# Patient Record
Sex: Male | Born: 1965 | Race: White | Hispanic: No | Marital: Married | Smoking: Current every day smoker
Health system: Southern US, Community
[De-identification: ages and names within clinical notes are randomized; demographics above are authoritative.]

## PROBLEM LIST (undated history)

## (undated) DIAGNOSIS — K259 Gastric ulcer, unspecified as acute or chronic, without hemorrhage or perforation: Secondary | ICD-10-CM

## (undated) DIAGNOSIS — F419 Anxiety disorder, unspecified: Secondary | ICD-10-CM

## (undated) DIAGNOSIS — D649 Anemia, unspecified: Secondary | ICD-10-CM

## (undated) DIAGNOSIS — C801 Malignant (primary) neoplasm, unspecified: Secondary | ICD-10-CM

## (undated) DIAGNOSIS — I10 Essential (primary) hypertension: Secondary | ICD-10-CM

## (undated) DIAGNOSIS — N289 Disorder of kidney and ureter, unspecified: Secondary | ICD-10-CM

## (undated) DIAGNOSIS — E119 Type 2 diabetes mellitus without complications: Secondary | ICD-10-CM

## (undated) DIAGNOSIS — K5792 Diverticulitis of intestine, part unspecified, without perforation or abscess without bleeding: Secondary | ICD-10-CM

## (undated) DIAGNOSIS — K219 Gastro-esophageal reflux disease without esophagitis: Secondary | ICD-10-CM

## (undated) HISTORY — PX: KIDNEY TRANSPLANT: SHX239

## (undated) HISTORY — PX: DIALYSIS FISTULA CREATION: SHX611

## (undated) HISTORY — PX: NASAL RECONSTRUCTION: SHX2069

## (undated) HISTORY — PX: COLON SURGERY: SHX602

## (undated) HISTORY — PX: COLON RESECTION: SHX5231

## (undated) SURGERY — Surgical Case
Anesthesia: *Unknown

---

## 2004-10-09 ENCOUNTER — Ambulatory Visit: Payer: Self-pay | Admitting: Nephrology

## 2006-04-30 ENCOUNTER — Other Ambulatory Visit: Payer: Self-pay

## 2006-04-30 ENCOUNTER — Emergency Department: Payer: Self-pay | Admitting: Emergency Medicine

## 2006-06-04 ENCOUNTER — Ambulatory Visit: Payer: Self-pay | Admitting: Vascular Surgery

## 2006-06-07 ENCOUNTER — Ambulatory Visit: Payer: Self-pay | Admitting: Nephrology

## 2006-09-03 ENCOUNTER — Ambulatory Visit: Payer: Self-pay | Admitting: Vascular Surgery

## 2006-09-25 ENCOUNTER — Ambulatory Visit: Payer: Self-pay | Admitting: Nephrology

## 2006-11-14 ENCOUNTER — Ambulatory Visit: Payer: Self-pay | Admitting: Nephrology

## 2007-06-02 ENCOUNTER — Ambulatory Visit: Payer: Self-pay | Admitting: Nephrology

## 2007-06-03 ENCOUNTER — Ambulatory Visit: Payer: Self-pay | Admitting: Internal Medicine

## 2007-06-04 ENCOUNTER — Ambulatory Visit: Payer: Self-pay | Admitting: Nephrology

## 2007-07-01 ENCOUNTER — Ambulatory Visit: Payer: Self-pay | Admitting: Nephrology

## 2011-11-05 DIAGNOSIS — Z940 Kidney transplant status: Secondary | ICD-10-CM | POA: Insufficient documentation

## 2012-03-03 ENCOUNTER — Emergency Department: Payer: Self-pay | Admitting: Emergency Medicine

## 2012-03-03 LAB — BASIC METABOLIC PANEL
Anion Gap: 5 — ABNORMAL LOW (ref 7–16)
BUN: 16 mg/dL (ref 7–18)
Calcium, Total: 9.6 mg/dL (ref 8.5–10.1)
Co2: 27 mmol/L (ref 21–32)
EGFR (Non-African Amer.): 53 — ABNORMAL LOW
Glucose: 99 mg/dL (ref 65–99)
Osmolality: 279 (ref 275–301)
Potassium: 4.2 mmol/L (ref 3.5–5.1)

## 2012-03-03 LAB — CBC
HGB: 14.3 g/dL (ref 13.0–18.0)
MCHC: 32.4 g/dL (ref 32.0–36.0)
MCV: 90 fL (ref 80–100)
Platelet: 200 10*3/uL (ref 150–440)
RBC: 4.91 10*6/uL (ref 4.40–5.90)
RDW: 14 % (ref 11.5–14.5)

## 2012-03-03 LAB — CK TOTAL AND CKMB (NOT AT ARMC)
CK, Total: 134 U/L (ref 35–232)
CK-MB: 2.3 ng/mL (ref 0.5–3.6)

## 2012-11-12 ENCOUNTER — Ambulatory Visit: Payer: Self-pay | Admitting: Unknown Physician Specialty

## 2012-11-17 ENCOUNTER — Ambulatory Visit: Payer: Self-pay | Admitting: Unknown Physician Specialty

## 2013-04-12 ENCOUNTER — Ambulatory Visit: Payer: Self-pay | Admitting: Orthopedic Surgery

## 2014-03-21 ENCOUNTER — Ambulatory Visit: Payer: Self-pay | Admitting: Family Medicine

## 2014-03-24 ENCOUNTER — Ambulatory Visit: Payer: Self-pay | Admitting: Family Medicine

## 2014-03-24 ENCOUNTER — Ambulatory Visit: Payer: Self-pay | Admitting: Internal Medicine

## 2014-04-11 ENCOUNTER — Inpatient Hospital Stay
Admit: 2014-04-11 | Disposition: A | Discharge status: Home / Self Care | Payer: Self-pay | Attending: Internal Medicine | Admitting: Internal Medicine

## 2014-04-11 LAB — CBC WITH DIFFERENTIAL/PLATELET
BASOS PCT: 0.5 %
Basophil #: 0.1 10*3/uL (ref 0.0–0.1)
Eosinophil #: 0.2 10*3/uL (ref 0.0–0.7)
Eosinophil %: 1.6 %
HCT: 43.5 % (ref 40.0–52.0)
HGB: 14.3 g/dL (ref 13.0–18.0)
LYMPHS ABS: 1.4 10*3/uL (ref 1.0–3.6)
Lymphocyte %: 12.6 %
MCH: 28.4 pg (ref 26.0–34.0)
MCHC: 32.9 g/dL (ref 32.0–36.0)
MCV: 87 fL (ref 80–100)
MONO ABS: 1.1 x10 3/mm — AB (ref 0.2–1.0)
MONOS PCT: 9.7 %
NEUTROS ABS: 8.5 10*3/uL — AB (ref 1.4–6.5)
Neutrophil %: 75.6 %
PLATELETS: 176 10*3/uL (ref 150–440)
RBC: 5.03 10*6/uL (ref 4.40–5.90)
RDW: 14.1 % (ref 11.5–14.5)
WBC: 11.2 10*3/uL — ABNORMAL HIGH (ref 3.8–10.6)

## 2014-04-11 LAB — URINALYSIS, COMPLETE
BACTERIA: NONE SEEN
Bilirubin,UR: NEGATIVE
Glucose,UR: NEGATIVE mg/dL (ref 0–75)
Ketone: NEGATIVE
Leukocyte Esterase: NEGATIVE
NITRITE: NEGATIVE
Ph: 6 (ref 4.5–8.0)
Protein: NEGATIVE
RBC,UR: 2 /HPF (ref 0–5)
Specific Gravity: 1.014 (ref 1.003–1.030)
Squamous Epithelial: NONE SEEN
WBC UR: <1 /HPF (ref 0–5)

## 2014-04-11 LAB — COMPREHENSIVE METABOLIC PANEL
ALT: 19 U/L
Albumin: 4.2 g/dL
Alkaline Phosphatase: 54 U/L
Anion Gap: 8 (ref 7–16)
BILIRUBIN TOTAL: 0.9 mg/dL
BUN: 19 mg/dL
CO2: 26 mmol/L
CREATININE: 1.31 mg/dL — AB
Calcium, Total: 9.7 mg/dL
Chloride: 103 mmol/L
EGFR (African American): >60
EGFR (Non-African Amer.): >60
GLUCOSE: 109 mg/dL — AB
Potassium: 3.8 mmol/L
SGOT(AST): 16 U/L
Sodium: 137 mmol/L
Total Protein: 7.8 g/dL

## 2014-04-11 LAB — TROPONIN I

## 2014-04-11 LAB — LIPASE, BLOOD: LIPASE: 56 U/L — AB

## 2014-04-12 LAB — CBC WITH DIFFERENTIAL/PLATELET
Basophil #: 0 10*3/uL (ref 0.0–0.1)
Basophil %: 0.5 %
Eosinophil #: 0.1 10*3/uL (ref 0.0–0.7)
Eosinophil %: 1.1 %
HCT: 39 % — ABNORMAL LOW (ref 40.0–52.0)
HGB: 12.9 g/dL — ABNORMAL LOW (ref 13.0–18.0)
Lymphocyte #: 1 10*3/uL (ref 1.0–3.6)
Lymphocyte %: 11 %
MCH: 28.5 pg (ref 26.0–34.0)
MCHC: 33.2 g/dL (ref 32.0–36.0)
MCV: 86 fL (ref 80–100)
Monocyte #: 0.9 x10 3/mm (ref 0.2–1.0)
Monocyte %: 9.6 %
Neutrophil #: 6.9 10*3/uL — ABNORMAL HIGH (ref 1.4–6.5)
Neutrophil %: 77.8 %
Platelet: 138 10*3/uL — ABNORMAL LOW (ref 150–440)
RBC: 4.53 10*6/uL (ref 4.40–5.90)
RDW: 14.1 % (ref 11.5–14.5)
WBC: 8.9 10*3/uL (ref 3.8–10.6)

## 2014-04-12 LAB — TROPONIN I

## 2014-04-12 LAB — BASIC METABOLIC PANEL
Anion Gap: 8 (ref 7–16)
BUN: 17 mg/dL
Calcium, Total: 9.2 mg/dL
Chloride: 103 mmol/L
Co2: 24 mmol/L
Creatinine: 1.41 mg/dL — ABNORMAL HIGH
EGFR (African American): >60
EGFR (Non-African Amer.): 58 — ABNORMAL LOW
Glucose: 129 mg/dL — ABNORMAL HIGH
Potassium: 3.8 mmol/L
Sodium: 135 mmol/L

## 2014-04-12 LAB — TSH: Thyroid Stimulating Horm: 1.694 u[IU]/mL

## 2014-04-12 LAB — VANCOMYCIN, TROUGH: Vancomycin, Trough: 9 ug/mL — ABNORMAL LOW

## 2014-04-13 DIAGNOSIS — R079 Chest pain, unspecified: Secondary | ICD-10-CM | POA: Diagnosis not present

## 2014-04-14 ENCOUNTER — Encounter: Payer: Self-pay | Admitting: Cardiovascular Disease

## 2014-05-06 NOTE — Op Note (Signed)
PATIENT NAME:  Billy Hughes MR#:  [id number] DATE OF BIRTH:  Mar 21, 1965  DATE OF PROCEDURE:  11/17/2012  PREOPERATIVE DIAGNOSIS: Severe nasal obstruction.   POSTOPERATIVE DIAGNOSIS: Severe nasal obstruction.  PROCEDURES: 1.  Nasal septoplasty.  2.  Bilateral submucous resection of inferior turbinates.   SURGEON: Beverly Gust, MD  OPERATIVE FINDINGS: 100% nasal obstruction due to severe septal deviation to the right, bilateral inferior turbinate hypertrophy.   DESCRIPTION OF PROCEDURE: Billy Hughes was identified in the holding area and taken to the operating room and placed in the supine position. After general endotracheal anesthesia, the table was turned 90 degrees and the patient was draped in the usual fashion for nasal surgery. A topical anesthetic of phenylephrine lidocaine was placed through the left nostril. It would not go on a pledget through the right nostril. After 5 minutes this was removed. A local anesthetic of 1% lidocaine with 1:100,000 epinephrine was used to inject the nasal septum and the inferior turbinate on the left. I could not seed the turbinate on the right due to the severe septal deformity. A total of 9 mL was used. With this completed, due to the severe nature of the septal deformity on the right, a hemitransfixion incision was created on the left. A subperichondrial plane was elevated on the right side around the large curvature of the septum. An inferior rim of cartilage was then excised and a subperichondrial plane was elevated posteriorly on the left. This isolated the large septal spur which had an obvious fracture which made the cartilage at approximately 80 degrees completely obstructing the nostril on the right. The inferior portion of this was elevated back to the perpendicular plate of the ethmoid where a subperiosteal plane was elevated on each side. This portion of the septum was removed because of the severe deformity. There was very thickened hardened bone  there which had to be chiseled as well to allow the septum to swing back into the midline. With the severe septal deformity removed, a small portion of the anterior cartilage inferiorly was also removed to allow the cartilage to swing back towards the middle. This gave excellent reduction of the nasal deformity with what appeared to be a previously obvious nasal fracture. With the septum back to the midline, a right-sided posterior/inferior fenestration was created to allow hematoma drainage. Attention was then turned to the inferior turbinates. The same local anesthetic was then injected into the right middle turbinate which could be visualized. A total of 2 mL was used. Beginning on the left hand side, a 15 blade was used to incise down to and through the underlying conchal bone of the inferior turbinate. A superolaterally based flap was then elevated. The conchal bone and underlying mucosa were then excised using the Knight scissors. The superolaterally based flap was placed back over the conchal stump and cauterized using the suction cautery. In a similar fashion, submucous resection was performed on the inferior turbinate on the right. With this completed, reinspection of the nose showed significant improvement in the severe 100% septal deformity to the right with the septum back in the midline. The hemitransfixion incision was then closed using 3-0 chromic. Standard septal splints were then placed within the nostrils against each side of the septum and affixed to the septum using a 3-0 nylon. Stammberger gel was then placed beneath each inferior turbinate. The patient was then returned to anesthesia where he was awakened in the operating room and taken to the recovery room in stable condition.  CULTURES: None.   SPECIMENS: None.   ESTIMATED BLOOD LOSS: Less than 20 mL. ____________________________ Roena Malady, MD ctm:sb D: 11/17/2012 11:00:50 ET T: 11/17/2012 11:08:39  ET JOB#: [id number]  cc: Roena Malady, MD, <Dictator> Roena Malady MD ELECTRONICALLY SIGNED 11/30/2012 7:58

## 2014-05-15 NOTE — Consult Note (Signed)
I agree with plans for discharge, recommend cipro and flagyl, he still has some mild tenderness in LLQ.  Stress test pending myoview studies.  I will sign off.  Electronic Signatures: Manya Silvas (MD)  (Signed on 30-Mar-16 16:09)  Authored  Last Updated: 30-Mar-16 16:09 by Manya Silvas (MD)

## 2014-05-15 NOTE — Consult Note (Signed)
PATIENT NAME:  Billy Hughes, Billy Hughes MR#:  [id number] DATE OF BIRTH:  [birth year]  DATE OF CONSULTATION:  04/11/2014  REFERRING PHYSICIAN:   CONSULTING PHYSICIAN:  Manya Silvas, MD  HISTORY OF PRESENT ILLNESS: The patient is a 49 year old white male who developed pain 3 weeks ago and was treated for probable diverticulitis by his primary doctor with Cipro and Flagyl. It got better for a week then the illness came back and he was admitted to the hospital for diverticulitis. I was asked to see him in consultation.   PAST MEDICAL HISTORY: He has had chronic kidney disease and is status post kidney transplant. He is on immunosuppressants for this. There is also a history of hypertension, hyperlipidemia, GERD, depression and anxiety and tobacco use.   PAST SURGICAL HISTORY: Kidney transplant, left knee arthroscopy, tympanoplasty tube placement, tonsillectomy, adenoidectomy, rhinoplasty, and calcified lipomas from the chest wall.   HABITS: A 35 pack-year history of smoking. Denies alcohol or illicit drugs. Lives with his wife.   FAMILY HISTORY: Mother with breast cancer. Father died of lung cancer.   ALLERGIES: CODEINE.   MEDICATIONS: Amlodipine 10 mg at bedtime, aspirin 81 mg a day, Lipitor 10 mg a day, magnesium gluconate 500 mg 1 tablet daily, metoprolol succinate extended release 25 mg 1-1/2 tablets p.o. b.i.d., Myfortic 180 mg delayed release 3 tablets p.o. b.i.d., pantoprazole 40 mg delayed release 1 at bedtime, Paxil 20 mg daily and Prograf 0.4 mg 1 capsule every morning.  PHYSICAL EXAMINATION: GENERAL: White male lying in bed, appropriate.  HEENT: Sclerae anicteric. Conjunctivae negative. Tongue negative. The head is atraumatic.  CHEST: Clear.  HEART: No murmurs or gallops that I can hear.  ABDOMEN: Mild tenderness in the left lower quadrant. Bowel sounds are present. No hepatosplenomegaly. EYES: No evidence of jaundice.   DIAGNOSTIC DATA: Glucose 109, BUN 19, creatinine 1.3, sodium  137, potassium 3.8, chloride 103, bicarb 26, calcium 9.7. Lipase 56. Serum albumin 4.2, alk phos 54, AST 16, ALT 19. Troponin negative. White count 11.2, hemoglobin 14.3, platelet count 176,000.  CAT scan of the abdomen shows acute diverticulitis in the distal descending colon with focal soft tissue inflammation, small amount of free fluid and severe bilateral renal atrophy.   ASSESSMENT AND RECOMMENDATIONS: Diverticulitis in a patient who is on immunosuppressive medication for previous kidney transplant. The patient was supposed to have a colonoscopy in about a month from now. I encouraged him to postpone that probably at least 3 months. He is on the right therapy for diverticulitis and I expect him to continue to get better. He has already a relationship with Dr. Allen Norris so he should follow up with him after discharge.  ____________________________ Manya Silvas, MD rte:sb D: 04/11/2014 16:41:06 ET T: 04/11/2014 17:06:58 ET JOB#: [id number]  cc: Manya Silvas, MD, <Dictator> Guadalupe Maple, MD Manya Silvas MD ELECTRONICALLY SIGNED 04/30/2014 16:05

## 2014-05-15 NOTE — Consult Note (Signed)
Pt abd pain feeling much better, he had a spell of chest pain with a burning sensation lasting 10-15 min.  He is over weight.  "Recommend cardiology consult. I have ordered.  creat 1.41, K 3.8, WBC 8.9, hgb 12.9. Will check CXR also at family request.  Discussed with Dr. Tor Netters.  Electronic Signatures: Manya Silvas (MD)  (Signed on 29-Mar-16 12:33)  Authored  Last Updated: 29-Mar-16 12:33 by Manya Silvas (MD)

## 2014-05-15 NOTE — Discharge Summary (Signed)
PATIENT NAME:  Billy Hughes, Billy Hughes MR#:  [id number] DATE OF BIRTH:  1965/03/10  DATE OF ADMISSION:  04/11/2014 DATE OF DISCHARGE:  04/13/2014  For a detailed note please check the history and physical done on admission by Dr. Rosilyn Mings.   DIAGNOSES AT DISCHARGE:  1. Acute diverticulitis.  2. Chest pain, likely noncardiac.  3. Chronic obstructive pulmonary disease.  4. Hypertension.  5. Hyperlipidemia.  6. History of renal transplant.   DIET: The patient is being discharged in a low-sodium, low-fat diet.   ACTIVITY: As tolerated.   FOLLOWUP:  With Dr. Kellie Shropshire True in the next 1-2 weeks.   DISCHARGE MEDICATIONS: Myfortic 180 mg daily, amlodipine 10 mg daily, aspirin 81 mg daily, Lipitor 10 mg daily, metoprolol succinate 25 mg 1-1/2 tabs b.i.d., Protonix 40 mg daily, Paxil 20 mg daily, magnesium gluconate 500 mg daily, Prograf 0.5 mg 2 caps at bedtime and 3 caps in the morning, ciprofloxacin 500 mg b.i.d. x 10 days, and Flagyl 500 mg q. 8 hours x 10 days, Tylenol with hydrocodone 1 tab q. 6 hours as needed.   CONSULTANTS DURING THE HOSPITAL COURSE: Dr. Vira Agar from gastroenterology.   PERTINENT STUDIES DONE DURING THE HOSPITAL COURSE: A CT scan of the abdomen and pelvis done without contrast on admission showing acute diverticulitis of the distal descending colon with focal soft tissue inflammation and small amount of free fluid, no evidence of perforation or abscess formation, bibasilar airspace opacities or possible pneumonia, scattered diverticulosis, severe bilateral native renal atrophy noted.   A nuclear medicine myocardial scan done on March 30 showing no significant wall motion abnormalities, ejection fraction of 69%, no EKG changes concerning for ischemia.   HOSPITAL COURSE: This is a 49 year old male with medical problems as mentioned above who presented to the hospital with abdominal pain and noted to have acute diverticulitis.   1.  Acute diverticulitis. This was likely the  cause of the patient's abdominal pain on admission. The patient was also noted to have CT evidence of diverticulitis. The patient was admitted to the hospital, started on IV ciprofloxacin and Flagyl, has been maintained on that. His abdominal pain after being on IV antibiotics for a few days has improved. His diet was slowly from clear liquid eventually to a regular diet which he is currently tolerating. The patient was seen by gastroenterology who agreed with this management.  Since the patient is clinically improved the patient is being discharged on oral ciprofloxacin and Flagyl as stated.  2.  Chest pain. The patient had some atypical chest pain in the hospital after ambulating. He had serial troponins checked which were negative. His EKG showed no acute ST changes. He does have risk factors given his tobacco abuse and his obesity, therefore he underwent a myocardial nuclear medicine scan which showed no evidence of any acute myocardial ischemia. He is currently chest pain-free and hemodynamically stable.  3.  History of renal transplant. The patient was maintained on his Prograf and Myfortic.  He will continue that.  4.  Hypertension. The patient remained hemodynamically stable. He will continue his Norvasc and Toprol.  5.  GERD.  The patient was maintained on his Protonix. He will resume that.  6.  Anxiety. The patient was maintained on his Paxil and he will resume that.  7.  Hypoxia. The patient initially when admitted to the hospital was slightly hypoxic, thought to have possible pneumonia based on his CT abdomen findings, although that was ruled out, as the patient likely has underlying  COPD that is why he was hypoxic. He had a repeat chest x-ray in the hospital which showed no pneumonia. He was taken off IV vancomycin. He was assessed for home oxygen but did not desaturate below 88% and remained clinically stable.  8.  Nicotine dependence. The patient was maintained on a nicotine patch and Nicotrol  inhaler, and he was strongly advised to quit smoking.   DISPOSITION:  The patient is being discharged home.   TIME SPENT ON DISCHARGE: 40 minutes    ____________________________ Belia Heman. Verdell Carmine, MD vjs:bu D: 04/13/2014 16:57:50 ET T: 04/13/2014 19:43:28 ET JOB#: [id number]  cc: Belia Heman. Verdell Carmine, MD, <Dictator> Clarise Cruz, MD Henreitta Leber MD ELECTRONICALLY SIGNED 04/21/2014 16:21

## 2014-05-15 NOTE — H&P (Signed)
PATIENT NAME:  Billy Hughes, Billy Hughes MR#:  [id number] DATE OF BIRTH:  17-Nov-1965  DATE OF ADMISSION:  04/11/2014  REFERRING PHYSICIAN: Charlesetta Ivory, MD  PRIMARY CARE PHYSICIAN: Golden Pop, MD  ADMISSION DIAGNOSIS: Pneumonia, diverticulitis and intractable pain.   HISTORY OF PRESENT ILLNESS: This is a 49 year old Caucasian male who presents to the Emergency Department complaining of left lower quadrant pain. The patient states that he initially had the pain approximately 3 weeks ago when he went to his primary care doctor who prescribed antibiotics for diverticulitis. At that time, he was found to be Hemoccult positive. He was set up with an appointment with Dr. Allen Norris from gastroenterology for a colonoscopy, on the 11th of April. He has been doing well since finishing antibiotics 1 week ago yesterday, but this morning he awoke with left lower quadrant pain yet again. He denies nausea, vomiting, diarrhea, or fever. He also denies any chest pain or shortness of breath. In the Emergency Department, the patient was found to have some desaturations in his peripheral oxygen content and then found to have early pneumonia on imaging, which prompted the Emergency Department to call for admission.   REVIEW OF SYSTEMS: CONSTITUTIONAL: The patient denies fever or weakness.  EYES: Denies blurred vision or inflammation.  EARS, NOSE AND THROAT: Denies tinnitus or sore throat.  RESPIRATORY: Denies cough or shortness of breath.  CARDIOVASCULAR: Denies chest pain, palpitations, orthopnea or paroxysmal nocturnal dyspnea.  GASTROINTESTINAL: Denies nausea, vomiting, diarrhea, or abdominal pain.  ENDOCRINE: Denies polyuria or polydipsia.  HEMATOLOGIC AND LYMPHATIC: Denies easy bruising or bleeding.  GENITOURINARY: Denies dysuria, increased frequency, or hesitancy of urination.  MUSCULOSKELETAL: Denies arthralgias or myalgias.  NEUROLOGIC: Denies numbness in his extremities or dysarthria.  PSYCHIATRIC: Denies  depression or suicidal ideation.   PAST MEDICAL HISTORY: Chronic kidney disease status post kidney transplant, tobacco abuse, hypertension, hyperlipidemia, gastroesophageal reflux disease, and depression/anxiety.   PAST SURGICAL HISTORY: The patient has had a kidney transplant, left knee arthroscopy, tympanoplasty as well as tympanostomy tube placement, tonsillectomy and adenoidectomy, rhinoplasty as well as removal of calcified lipomas from his chest wall.   SOCIAL HISTORY: The patient lives with his wife. He is a 35 pack-year smoker. He denies drinking or any illicit drug use.   FAMILY HISTORY: Significant for hypertension throughout many members of the family. His father is deceased of lung cancer and his mother had breast cancer.   MEDICATIONS: 1.  Amlodipine 10 mg 1 tablet p.o. at bedtime.  2.  Aspirin 81 mg 1 tab p.o. at bedtime.  3.  Lipitor 10 mg 1 tablet p.o. every morning.  4.  Magnesium gluconate 500 mg 1 tablet p.o. daily.  5.  Metoprolol succinate extended release 25 mg 1-1/2 tablets p.o. b.i.d.  6.  Myfortic 180 mg delayed release 3 tablets p.o. b.i.d.  7.  Pantoprazole 40 mg delayed release 1 tablet p.o. at bedtime.  8.  Paxil 20 mg 1 tablet p.o. daily.  9.  Prograf 0.4 mg 1 capsule p.o. every morning.   ALLERGIES: CODEINE.  PERTINENT LABORATORY RESULTS AND RADIOGRAPHIC FINDINGS: Serum glucose 109, BUN 19, creatinine 1.31, serum sodium 137, potassium 3.8, chloride 103, bicarb 26, calcium 9.7. Lipase 56. Serum albumin 4.2, alk phos 54, AST 16, ALT 19. Troponin is negative. White blood cell count 11.2, hemoglobin 14.3, hematocrit 43.5, platelet count 176,000, MCV  87.   Urinalysis is negative for infection.   CT of the abdomen and pelvis without contrast shows acute diverticulitis in the distal descending colon with  focal soft tissue inflammation and a small amount of free fluid. There is no evidence of perforation or abscess. There are also bibasilar air space opacities that  may reflect atelectasis or possibly mild pneumonia, particularly in the right base. There is some scattered diverticulosis. There is severe bilateral native renal atrophy. 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   PHYSICAL EXAMINATION: VITAL SIGNS: Temperature 98.6, pulse 72, respirations 18, blood pressure 142/78, pulse oximetry 91% on 2 liters of oxygen via nasal cannula.  GENERAL: The patient is alert and oriented x3, in no apparent distress.  HEENT: Normocephalic, atraumatic. Pupils equal, round, and reactive to light and accommodation. Extraocular movements are intact. Mucous membranes are moist.  NECK: Trachea is midline. No adenopathy. Thyroid is nonpalpable and nontender.  CHEST: Symmetric and atraumatic.  CARDIOVASCULAR: Regular rate and rhythm. Normal S1, S2. No rubs, clicks, or murmurs appreciated.  LUNGS: Clear to auscultation bilaterally. Normal effort and excursion.  ABDOMEN: Positive bowel sounds. Soft, nontender, nondistended. No hepatosplenomegaly.  GENITOURINARY: Deferred.  MUSCULOSKELETAL: The patient moves all 4 extremities equally. There is 5 out of 5 strength in upper and lower extremities bilaterally. The patient has a normal gait.  SKIN: Warm and dry. No rashes or lesions.  EXTREMITIES: No clubbing, cyanosis, or edema.  NEUROLOGIC: Cranial nerves II through XII are grossly intact.  PSYCHIATRIC: Mood is normal. Affect is congruent. The patient has excellent judgment and insight into his medical condition.   ASSESSMENT AND PLAN: This is a 49 year old male admitted for pneumonia and diverticulitis and  pain.  1.  Pneumonia. The patient is immunocompromised due to his kidney transplant and immunosuppressive drugs. Thus, we will treat him for healthcare associated  pneumonia. He has already received ciprofloxacin and metronidazole for his diverticulitis. We will add vancomycin to cover healthcare associated pneumonia.  2.  Diverticulitis. The patient does not have a surgical abdomen at this time. There is no evidence of perforation. We will continue ciprofloxacin and Flagyl.  3.  Pain. The patient has colicky pain of the left lower quadrant. He responds well to Dilaudid and maintains his alertness. We will continue to use this medication cautiously.  4.  Kidney transplant. The patient has no evidence of rejection at this time. We will continue tacrolimus and Myfortic.  5.  Hypertension. Continue amlodipine.  6.  Hyperlipidemia. Continue statin therapy.  7.  Gastroesophageal reflux disease. Continue pantoprazole.  8.  Tobacco abuse. I have prescribed a nicotine patch for the patient.  9.  Depression and anxiety. Continue Paxil.  CODE STATUS: FULL.  TIME SPENT ON ADMISSION ORDERS AND PATIENT CARE: Approximately 35 minutes.  ____________________________ Norva Riffle. Marcille Blanco, MD msd:sb D: 04/11/2014 07:24:38 ET T: 04/11/2014 07:38:24 ET JOB#: [id number]  cc: Norva Riffle. Marcille Blanco, MD, <Dictator> Norva Riffle Jaidin Richison MD ELECTRONICALLY SIGNED 04/12/2014 0:03

## 2014-10-29 ENCOUNTER — Ambulatory Visit
Admission: EM | Admit: 2014-10-29 | Discharge: 2014-10-29 | Disposition: A | Discharge status: Home / Self Care | Payer: BLUE CROSS/BLUE SHIELD | Attending: Family Medicine | Admitting: Family Medicine

## 2014-10-29 DIAGNOSIS — H6012 Cellulitis of left external ear: Secondary | ICD-10-CM | POA: Diagnosis not present

## 2014-10-29 HISTORY — DX: Essential (primary) hypertension: I10

## 2014-10-29 MED ORDER — SULFAMETHOXAZOLE-TRIMETHOPRIM 800-160 MG PO TABS: 1.0000 | ORAL_TABLET | Freq: Two times a day (BID) | ORAL | Status: DC

## 2014-10-29 MED ORDER — OXYCODONE-ACETAMINOPHEN 5-325 MG PO TABS: 1.0000 | ORAL_TABLET | ORAL | Status: DC | PRN

## 2014-10-29 MED ORDER — CEFAZOLIN SODIUM 1 G IJ SOLR
1.0000 g | Freq: Once | INTRAMUSCULAR | Status: AC
  Administered 2014-10-29: 1 g via INTRAMUSCULAR

## 2014-10-29 NOTE — Discharge Instructions (Signed)

## 2014-10-29 NOTE — ED Provider Notes (Signed)
CSN: [id number]     Arrival date & time 10/29/14  1142 History   First MD Initiated Contact with Patient 10/29/14 1241     Chief Complaint  Patient presents with  . Otalgia    Left ear reddened, swelling and pain. Has had in the past 5 years ago. Pain 6/10.    (Consider location/radiation/quality/duration/timing/severity/associated sxs/prior Treatment) HPI Comments: 49 yo male with a 3 days h/o progressively worsening left ear redness, swelling, and pain. Denies fevers, chills, drainage.  The history is provided by the patient.    Past Medical History  Diagnosis Date  . Hypertension    Past Surgical History  Procedure Laterality Date  . Kidney transplant     History reviewed. No pertinent family history. Social History  Substance Use Topics  . Smoking status: Current Every Day Smoker -- 1.00 packs/day    Types: Cigarettes  . Smokeless tobacco: None  . Alcohol Use: No    Review of Systems  Allergies  Review of patient's allergies indicates no known allergies.  Home Medications   Prior to Admission medications   Medication Sig Start Date End Date Taking? Authorizing Provider  amLODipine (NORVASC) 10 MG tablet Take 10 mg by mouth 2 (two) times daily.   Yes Historical Provider, MD  aspirin 81 MG tablet Take 81 mg by mouth daily.   Yes Historical Provider, MD  atorvastatin (LIPITOR) 20 MG tablet Take 20 mg by mouth daily.   Yes Historical Provider, MD  magnesium gluconate (MAGONATE) 500 MG tablet Take 500 mg by mouth 2 (two) times daily.   Yes Historical Provider, MD  metoprolol succinate (TOPROL-XL) 100 MG 24 hr tablet Take 37.5 mg by mouth daily. Take with or immediately following a meal.   Yes Historical Provider, MD  mycophenolate (MYFORTIC) 180 MG EC tablet Take 180 mg by mouth 2 (two) times daily.   Yes Historical Provider, MD  pantoprazole (PROTONIX) 40 MG tablet Take 40 mg by mouth daily.   Yes Historical Provider, MD  PARoxetine (PAXIL) 20 MG tablet Take 20 mg by  mouth daily.   Yes Historical Provider, MD  tacrolimus (PROGRAF) 0.5 MG capsule Take 0.5 mg by mouth 2 (two) times daily.   Yes Historical Provider, MD  oxyCODONE-acetaminophen (PERCOCET/ROXICET) 5-325 MG tablet Take 1-2 tablets by mouth every 4 (four) hours as needed for severe pain. 10/29/14   Lorin Picket, PA-C  sulfamethoxazole-trimethoprim (BACTRIM DS,SEPTRA DS) 800-160 MG tablet Take 1 tablet by mouth 2 (two) times daily. 10/29/14   Norval Gable, MD   Meds Ordered and Administered this Visit   Medications  ceFAZolin (ANCEF) injection 1 g (1 g Intramuscular Given 10/29/14 1306)    BP 150/60 mmHg  Pulse 71  Temp(Src) 98 F (36.7 C) (Oral)  Resp 20  Ht 6' (1.829 m)  Wt 256 lb (116.121 kg)  BMI 34.71 kg/m2  SpO2 95% No data found.   Physical Exam  Constitutional: He appears well-developed and well-nourished. No distress.  Skin: He is not diaphoretic. There is erythema.  Left lower earlobe with edema, blanchable erythema, warmth, and tenderness to palpation; no fluctuance or drainage  Nursing note and vitals reviewed.   ED Course  Procedures (including critical care time)  Labs Review Labs Reviewed - No data to display  Imaging Review No results found.   Visual Acuity Review  Right Eye Distance:   Left Eye Distance:   Bilateral Distance:    Right Eye Near:   Left Eye Near:  Bilateral Near:         MDM   1. Cellulitis of earlobe, left    Discharge Medication List as of 10/29/2014  1:00 PM    START taking these medications   Details  sulfamethoxazole-trimethoprim (BACTRIM DS,SEPTRA DS) 800-160 MG tablet Take 1 tablet by mouth 2 (two) times daily., Starting 10/29/2014, Until Discontinued, Normal      1. diagnosis reviewed with patient/parent/guardian/family 2. rx as per orders above; reviewed possible side effects, interactions, risks and benefits  3. Recommend supportive treatment with warm compresses to affected area and otc analgesics  4.  Follow prn if symptoms worsen or don't improve    Norval Gable, MD 11/02/14 2139

## 2014-11-21 ENCOUNTER — Ambulatory Visit (INDEPENDENT_AMBULATORY_CARE_PROVIDER_SITE_OTHER): Payer: BLUE CROSS/BLUE SHIELD

## 2014-11-21 ENCOUNTER — Ambulatory Visit
Admission: EM | Admit: 2014-11-21 | Discharge: 2014-11-21 | Disposition: A | Discharge status: Home / Self Care | Payer: BLUE CROSS/BLUE SHIELD | Attending: Emergency Medicine | Admitting: Emergency Medicine

## 2014-11-21 DIAGNOSIS — M6248 Contracture of muscle, other site: Secondary | ICD-10-CM

## 2014-11-21 DIAGNOSIS — S46811A Strain of other muscles, fascia and tendons at shoulder and upper arm level, right arm, initial encounter: Secondary | ICD-10-CM | POA: Diagnosis not present

## 2014-11-21 DIAGNOSIS — M62838 Other muscle spasm: Secondary | ICD-10-CM

## 2014-11-21 MED ORDER — CYCLOBENZAPRINE HCL 10 MG PO TABS: 10.0000 mg | ORAL_TABLET | Freq: Three times a day (TID) | ORAL | Status: DC | PRN

## 2014-11-21 MED ORDER — HYDROCODONE-ACETAMINOPHEN 5-325 MG PO TABS: 2.0000 | ORAL_TABLET | ORAL | Status: DC | PRN

## 2014-11-21 NOTE — ED Notes (Signed)
Lifting a sleeper sofa last Tuesday and felt a pull right shoulder. Unable to lift arm above head

## 2014-11-21 NOTE — Discharge Instructions (Signed)
Do lots of Stretching. Some people find professional deep tissue massage helpful. Take Tylenol as needed. Take Norco only for severe pain. Follow-up with physical therapy. Follow-up with primary care physician as needed. Return here if not getting better, to the ER for weakness, numbness, inability to lift arm.

## 2014-11-21 NOTE — ED Provider Notes (Signed)
HPI  SUBJECTIVE:  Billy Hughes is a 49 y.o. male who presents with right neck and shoulder pain starting 6 days ago. States that he was lifting  a sleeper sofa, felt something "give way". He now reports constant, dull, neck and midclavicular pain. He denies hearing a pop or crack, swelling, erythema, nausea, vomiting, fevers, numbness. He does report occasional tingling. He denies any weakness in his arm or shoulder. Symptoms are worse with raising his arm, lifting objects and worse in the morning after he has been sleeping on his right side, reports pain with turning his head rotation, neck extension. No pain with neck flexion. no alleviating factors. He has tried ice, heat, icy hot, oxycodone which is left over from a recent colon resection. Past medical history of kidney transplant on Prograf and Myfortic. States that his kidneys are working well. Status post colon resection for diverticulitis, hypertension. No history of neck injury, shoulder injury, diabetes.    Past Medical History  Diagnosis Date  . Hypertension     Past Surgical History  Procedure Laterality Date  . Kidney transplant      Family History  Problem Relation Age of Onset  . Cancer Mother   . Cancer Father     Social History  Substance Use Topics  . Smoking status: Current Every Day Smoker -- 1.00 packs/day    Types: Cigarettes  . Smokeless tobacco: None  . Alcohol Use: No    No current facility-administered medications for this encounter.  Current outpatient prescriptions:  .  amLODipine (NORVASC) 10 MG tablet, Take 10 mg by mouth 2 (two) times daily., Disp: , Rfl:  .  aspirin 81 MG tablet, Take 81 mg by mouth daily., Disp: , Rfl:  .  atorvastatin (LIPITOR) 20 MG tablet, Take 20 mg by mouth daily., Disp: , Rfl:  .  magnesium gluconate (MAGONATE) 500 MG tablet, Take 500 mg by mouth 2 (two) times daily., Disp: , Rfl:  .  metoprolol succinate (TOPROL-XL) 100 MG 24 hr tablet, Take 37.5 mg by mouth  daily. Take with or immediately following a meal., Disp: , Rfl:  .  mycophenolate (MYFORTIC) 180 MG EC tablet, Take 180 mg by mouth 2 (two) times daily., Disp: , Rfl:  .  pantoprazole (PROTONIX) 40 MG tablet, Take 40 mg by mouth daily., Disp: , Rfl:  .  PARoxetine (PAXIL) 20 MG tablet, Take 20 mg by mouth daily., Disp: , Rfl:  .  tacrolimus (PROGRAF) 0.5 MG capsule, Take 0.5 mg by mouth 2 (two) times daily., Disp: , Rfl:  .  cyclobenzaprine (FLEXERIL) 10 MG tablet, Take 1 tablet (10 mg total) by mouth 3 (three) times daily as needed for muscle spasms., Disp: 20 tablet, Rfl: 0 .  HYDROcodone-acetaminophen (NORCO/VICODIN) 5-325 MG tablet, Take 2 tablets by mouth every 4 (four) hours as needed for moderate pain., Disp: 20 tablet, Rfl: 0  No Known Allergies   ROS  As noted in HPI.   Physical Exam  BP 145/78 mmHg  Pulse 70  Temp(Src) 96.9 F (36.1 C) (Tympanic)  Resp 16  Ht 6' (1.829 m)  Wt 250 lb (113.399 kg)  BMI 33.90 kg/m2  SpO2 99%  Constitutional: Well developed, well nourished, no acute distress Eyes:  EOMI, conjunctiva normal bilaterally HENT: Normocephalic, atraumatic,mucus membranes moist Neck: No C-spine tenderness. Positive right trapezial muscle tenderness, spasm. Patient able to perform a FROM neck. No torticollis. Respiratory: Normal inspiratory effort Cardiovascular: Normal rate GI: nondistended skin: No rash, skin intact Musculoskeletal: Tenderness  mid third clavicle, no bruising, deformity. R shoulder with ROM  somewhat limited , Drop test normal , pain with abduction past 90, Tenderness R trapezius, A/C joint NT , scapula NT , proximal humerus NT, shoulder joint NT, Motor strength normal  Sensation intact LT over deltoid region, distal NVI with hand on affected side having intact sensation and strength in the distribution of the median, radial, and ulnar nerve function.   negative tenderness in bicipital groove,  negative empty can test negative liftoff  test  Neurologic: Alert & oriented x 3, no focal neuro deficits Psychiatric: Speech and behavior appropriate   ED Course   Medications - No data to display  Orders Placed This Encounter  Procedures  . DG Clavicle Right    Standing Status: Standing     Number of Occurrences: 1     Standing Expiration Date:     Order Specific Question:  Reason for Exam (SYMPTOM  OR DIAGNOSIS REQUIRED)    Answer:  tenderness mid 1/3 clavicle r/o fx lytic bone lesion    No results found for this or any previous visit (from the past 24 hour(s)). Dg Clavicle Right  11/21/2014  CLINICAL DATA:  Tenderness over mid clavicle. EXAM: RIGHT CLAVICLE - 2+ VIEWS COMPARISON:  None. FINDINGS: There is no evidence of fracture or other focal bone lesions. Soft tissues are unremarkable. IMPRESSION: Negative. Electronically Signed   By: Rolm Baptise M.D.   On: 11/21/2014 15:20    ED Clinical Impression  Trapezius strain, right, initial encounter  Trapezius muscle spasm   ED Assessment/Plan  Reviewed imaging. No fracture, dislocation, with bone lesion See radiology report for full details.  Presentation most consistent with trapezial muscle strain/sprain with spasm. Home with Stretching,  deep tissue massage, percocet. PT referral. Follow-up with primary care physician as needed. Return here if not getting better, to the ER for weakness, numbness, inability to lift arm.  Discussed  imaging, MDM, plan and followup with patient  Discussed sn/sx that should prompt return to the UC or ED. Patient agrees with plan. *This clinic note was created using Dragon dictation software. Therefore, there may be occasional mistakes despite careful proofreading.  ?   Melynda Ripple, MD 11/21/14 1558

## 2015-02-28 ENCOUNTER — Ambulatory Visit
Admission: EM | Admit: 2015-02-28 | Discharge: 2015-02-28 | Disposition: A | Discharge status: Home / Self Care | Payer: BLUE CROSS/BLUE SHIELD | Attending: Family Medicine | Admitting: Family Medicine

## 2015-02-28 DIAGNOSIS — J0100 Acute maxillary sinusitis, unspecified: Secondary | ICD-10-CM | POA: Diagnosis not present

## 2015-02-28 MED ORDER — AMOXICILLIN-POT CLAVULANATE 875-125 MG PO TABS: 1.0000 | ORAL_TABLET | Freq: Two times a day (BID) | ORAL | Status: DC

## 2015-02-28 MED ORDER — FLUTICASONE PROPIONATE 50 MCG/ACT NA SUSP: 2.0000 | Freq: Every day | NASAL | Status: DC

## 2015-02-28 NOTE — ED Notes (Signed)
States has had sinus congestion x 1 month and sinus headache x 2 weeks. Taking Vicodin (that has from surgery) and occasionally "will help". Blowing green mucous from nose. + productive clear cough

## 2015-02-28 NOTE — ED Provider Notes (Signed)
CSN: [id number]     Arrival date & time 02/28/15  1808 History   First MD Initiated Contact with Patient 02/28/15 1841     Chief Complaint  Patient presents with  . Facial Pain   (Consider location/radiation/quality/duration/timing/severity/associated sxs/prior Treatment) HPI   50 year old male who is well-known to this practice complaining of sinus congestion for one month with a sinus headache for 1-2 weeks. He states that he's tried numerous over-the-counter preparations which has not been beneficial. He has a headache over the frontal area under his eyes. He states that he has had a large amount of mucus production that has been green and copious. Not been afebrile but has had a great deal of fatigue. He has been coughing but most likely from a postnasal drip it is nonproductive.    Past Medical History  Diagnosis Date  . Hypertension    Past Surgical History  Procedure Laterality Date  . Kidney transplant     Family History  Problem Relation Age of Onset  . Cancer Mother   . Cancer Father    Social History  Substance Use Topics  . Smoking status: Current Every Day Smoker -- 1.00 packs/day    Types: Cigarettes  . Smokeless tobacco: None  . Alcohol Use: No    Review of Systems  Constitutional: Positive for activity change. Negative for fever, chills and fatigue.  HENT: Positive for congestion, postnasal drip, rhinorrhea, sinus pressure, sneezing and sore throat.   Respiratory: Positive for cough. Negative for shortness of breath, wheezing and stridor.   All other systems reviewed and are negative.   Allergies  Review of patient's allergies indicates no known allergies.  Home Medications   Prior to Admission medications   Medication Sig Start Date End Date Taking? Authorizing Provider  amLODipine (NORVASC) 10 MG tablet Take 10 mg by mouth 2 (two) times daily.   Yes Historical Provider, MD  aspirin 81 MG tablet Take 81 mg by mouth daily.   Yes Historical Provider,  MD  atorvastatin (LIPITOR) 20 MG tablet Take 20 mg by mouth daily.   Yes Historical Provider, MD  cyclobenzaprine (FLEXERIL) 10 MG tablet Take 1 tablet (10 mg total) by mouth 3 (three) times daily as needed for muscle spasms. 11/21/14  Yes Melynda Ripple, MD  HYDROcodone-acetaminophen (NORCO/VICODIN) 5-325 MG tablet Take 2 tablets by mouth every 4 (four) hours as needed for moderate pain. 11/21/14  Yes Melynda Ripple, MD  magnesium gluconate (MAGONATE) 500 MG tablet Take 500 mg by mouth 2 (two) times daily.   Yes Historical Provider, MD  metoprolol succinate (TOPROL-XL) 100 MG 24 hr tablet Take 37.5 mg by mouth daily. Take with or immediately following a meal.   Yes Historical Provider, MD  mycophenolate (MYFORTIC) 180 MG EC tablet Take 180 mg by mouth 2 (two) times daily.   Yes Historical Provider, MD  pantoprazole (PROTONIX) 40 MG tablet Take 40 mg by mouth daily.   Yes Historical Provider, MD  PARoxetine (PAXIL) 20 MG tablet Take 20 mg by mouth daily.   Yes Historical Provider, MD  tacrolimus (PROGRAF) 0.5 MG capsule Take 0.5 mg by mouth 2 (two) times daily.   Yes Historical Provider, MD  amoxicillin-clavulanate (AUGMENTIN) 875-125 MG tablet Take 1 tablet by mouth every 12 (twelve) hours. 02/28/15   Lorin Picket, PA-C  fluticasone (FLONASE) 50 MCG/ACT nasal spray Place 2 sprays into both nostrils daily. 02/28/15   Lorin Picket, PA-C   Meds Ordered and Administered this Visit  Medications -  No data to display  BP 163/72 mmHg  Pulse 70  Temp(Src) 97.9 F (36.6 C) (Tympanic)  Resp 18  Ht 6' (1.829 m)  Wt 260 lb (117.935 kg)  BMI 35.25 kg/m2  SpO2 97% No data found.   Physical Exam  Constitutional: He is oriented to person, place, and time. He appears well-developed and well-nourished. No distress.  HENT:  Head: Normocephalic and atraumatic.  Right Ear: External ear normal.  Left Ear: External ear normal.  Nose: Nose normal.  Mouth/Throat: Oropharynx is clear and moist.   Tenderness to percussion over the maxillary sinuses more prevalent on the left. He has postnasal drip in the oropharynx. Pharynx does not have any exudate or erythema.  Eyes: Conjunctivae are normal. Pupils are equal, round, and reactive to light.  Neck: Normal range of motion. Neck supple.  Pulmonary/Chest: Effort normal and breath sounds normal. No respiratory distress. He has no rales.  Musculoskeletal: Normal range of motion. He exhibits no edema or tenderness.  Lymphadenopathy:    He has no cervical adenopathy.  Neurological: He is alert and oriented to person, place, and time.  Skin: Skin is warm and dry. He is not diaphoretic.  Psychiatric: He has a normal mood and affect. His behavior is normal. Judgment and thought content normal.  Nursing note and vitals reviewed.   ED Course  Procedures (including critical care time)  Labs Review Labs Reviewed - No data to display  Imaging Review No results found.   Visual Acuity Review  Right Eye Distance:   Left Eye Distance:   Bilateral Distance:    Right Eye Near:   Left Eye Near:    Bilateral Near:         MDM   1. Acute maxillary sinusitis, recurrence not specified    Discharge Medication List as of 02/28/2015  6:55 PM    START taking these medications   Details  amoxicillin-clavulanate (AUGMENTIN) 875-125 MG tablet Take 1 tablet by mouth every 12 (twelve) hours., Starting 02/28/2015, Until Discontinued, Normal    fluticasone (FLONASE) 50 MCG/ACT nasal spray Place 2 sprays into both nostrils daily., Starting 02/28/2015, Until Discontinued, Normal      Plan: 1.Diagnosis reviewed with patient 2. rx as per orders; risks, benefits, potential side effects reviewed with patient 3. Recommend supportive treatment with rest and fluids. I will place him on some Flonase that he will use for 3-4 weeks for drainage. I placed him on Augmentin for a possible sinus infection and if he does not notice improvement in 2 days I asked  him to return for further evaluation.  4. F/u prn if symptoms worsen or don't improve     Lorin Picket, PA-C 02/28/15 1904

## 2015-02-28 NOTE — Discharge Instructions (Signed)
Sinus Rinse WHAT IS A SINUS RINSE? A sinus rinse is a simple home treatment that is used to rinse your sinuses with a sterile mixture of salt and water (saline solution). Sinuses are air-filled spaces in your skull behind the bones of your face and forehead that open into your nasal cavity. You will use the following:  Saline solution.  Neti pot or spray bottle. This releases the saline solution into your nose and through your sinuses. Neti pots and spray bottles can be purchased at [employer], a health food store, or online. WHEN WOULD I DO A SINUS RINSE? A sinus rinse can help to clear mucus, dirt, dust, or pollen from the nasal cavity. You may do a sinus rinse when you have a cold, a virus, nasal allergy symptoms, a sinus infection, or stuffiness in the nose or sinuses. If you are considering a sinus rinse:  Ask your child's health care provider before performing a sinus rinse on your child.  Do not do a sinus rinse if you have had ear or nasal surgery, ear infection, or blocked ears. HOW DO I DO A SINUS RINSE?  Wash your hands.  Disinfect your device according to the directions provided and then dry it.  Use the solution that comes with your device or one that is sold separately in stores. Follow the mixing directions on the package.  Fill your device with the amount of saline solution as directed by the device instructions.  Stand over a sink and tilt your head sideways over the sink.  Place the spout of the device in your upper nostril (the one closer to the ceiling).  Gently pour or squeeze the saline solution into the nasal cavity. The liquid should drain to the lower nostril if you are not overly congested.  Gently blow your nose. Blowing too hard may cause ear pain.  Repeat in the other nostril.  Clean and rinse your device with clean water and then air-dry it. ARE THERE RISKS OF A SINUS RINSE?  Sinus rinse is generally very safe and effective. However, there  are a few risks, which include:   A burning sensation in the sinuses. This may happen if you do not make the saline solution as directed. Make sure to follow all directions when making the saline solution.  Infection from contaminated water. This is rare, but possible.  Nasal irritation.   This information is not intended to replace advice given to you by your health care provider. Make sure you discuss any questions you have with your health care provider.   Document Released: 07/28/2013 Document Reviewed: 07/28/2013 Elsevier Interactive Patient Education 2016 [employer]  Sinus Rinse WHAT IS A SINUS RINSE? A sinus rinse is a simple home treatment that is used to rinse your sinuses with a sterile mixture of salt and water (saline solution). Sinuses are air-filled spaces in your skull behind the bones of your face and forehead that open into your nasal cavity. You will use the following:  Saline solution.  Neti pot or spray bottle. This releases the saline solution into your nose and through your sinuses. Neti pots and spray bottles can be purchased at [employer], a health food store, or online. WHEN WOULD I DO A SINUS RINSE? A sinus rinse can help to clear mucus, dirt, dust, or pollen from the nasal cavity. You may do a sinus rinse when you have a cold, a virus, nasal allergy symptoms, a sinus infection, or stuffiness in the nose  or sinuses. If you are considering a sinus rinse:  Ask your child's health care provider before performing a sinus rinse on your child.  Do not do a sinus rinse if you have had ear or nasal surgery, ear infection, or blocked ears. HOW DO I DO A SINUS RINSE?  Wash your hands.  Disinfect your device according to the directions provided and then dry it.  Use the solution that comes with your device or one that is sold separately in stores. Follow the mixing directions on the package.  Fill your device with the amount of saline solution as  directed by the device instructions.  Stand over a sink and tilt your head sideways over the sink.  Place the spout of the device in your upper nostril (the one closer to the ceiling).  Gently pour or squeeze the saline solution into the nasal cavity. The liquid should drain to the lower nostril if you are not overly congested.  Gently blow your nose. Blowing too hard may cause ear pain.  Repeat in the other nostril.  Clean and rinse your device with clean water and then air-dry it. ARE THERE RISKS OF A SINUS RINSE?  Sinus rinse is generally very safe and effective. However, there are a few risks, which include:   A burning sensation in the sinuses. This may happen if you do not make the saline solution as directed. Make sure to follow all directions when making the saline solution.  Infection from contaminated water. This is rare, but possible.  Nasal irritation.   This information is not intended to replace advice given to you by your health care provider. Make sure you discuss any questions you have with your health care provider.   Document Released: 07/28/2013 Document Reviewed: 07/28/2013 Elsevier Interactive Patient Education 2016 [employer].  Sinusitis, Adult Sinusitis is redness, soreness, and inflammation of the paranasal sinuses. Paranasal sinuses are air pockets within the bones of your face. They are located beneath your eyes, in the middle of your forehead, and above your eyes. In healthy paranasal sinuses, mucus is able to drain out, and air is able to circulate through them by way of your nose. However, when your paranasal sinuses are inflamed, mucus and air can become trapped. This can allow bacteria and other germs to grow and cause infection. Sinusitis can develop quickly and last only a short time (acute) or continue over a long period (chronic). Sinusitis that lasts for more than 12 weeks is considered chronic. CAUSES Causes of sinusitis  include:  Allergies.  Structural abnormalities, such as displacement of the cartilage that separates your nostrils (deviated septum), which can decrease the air flow through your nose and sinuses and affect sinus drainage.  Functional abnormalities, such as when the small hairs (cilia) that line your sinuses and help remove mucus do not work properly or are not present. SIGNS AND SYMPTOMS Symptoms of acute and chronic sinusitis are the same. The primary symptoms are pain and pressure around the affected sinuses. Other symptoms include:  Upper toothache.  Earache.  Headache.  Bad breath.  Decreased sense of smell and taste.  A cough, which worsens when you are lying flat.  Fatigue.  Fever.  Thick drainage from your nose, which often is green and may contain pus (purulent).  Swelling and warmth over the affected sinuses. DIAGNOSIS Your health care provider will perform a physical exam. During your exam, your health care provider may perform any of the following to help determine if you  have acute sinusitis or chronic sinusitis:  Look in your nose for signs of abnormal growths in your nostrils (nasal polyps).  Tap over the affected sinus to check for signs of infection.  View the inside of your sinuses using an imaging device that has a light attached (endoscope). If your health care provider suspects that you have chronic sinusitis, one or more of the following tests may be recommended:  Allergy tests.  Nasal culture. A sample of mucus is taken from your nose, sent to a lab, and screened for bacteria.  Nasal cytology. A sample of mucus is taken from your nose and examined by your health care provider to determine if your sinusitis is related to an allergy. TREATMENT Most cases of acute sinusitis are related to a viral infection and will resolve on their own within 10 days. Sometimes, medicines are prescribed to help relieve symptoms of both acute and chronic sinusitis.  These may include pain medicines, decongestants, nasal steroid sprays, or saline sprays. However, for sinusitis related to a bacterial infection, your health care provider will prescribe antibiotic medicines. These are medicines that will help kill the bacteria causing the infection. Rarely, sinusitis is caused by a fungal infection. In these cases, your health care provider will prescribe antifungal medicine. For some cases of chronic sinusitis, surgery is needed. Generally, these are cases in which sinusitis recurs more than 3 times per year, despite other treatments. HOME CARE INSTRUCTIONS  Drink plenty of water. Water helps thin the mucus so your sinuses can drain more easily.  Use a humidifier.  Inhale steam 3-4 times a day (for example, sit in the bathroom with the shower running).  Apply a warm, moist washcloth to your face 3-4 times a day, or as directed by your health care provider.  Use saline nasal sprays to help moisten and clean your sinuses.  Take medicines only as directed by your health care provider.  If you were prescribed either an antibiotic or antifungal medicine, finish it all even if you start to feel better. SEEK IMMEDIATE MEDICAL CARE IF:  You have increasing pain or severe headaches.  You have nausea, vomiting, or drowsiness.  You have swelling around your face.  You have vision problems.  You have a stiff neck.  You have difficulty breathing.   This information is not intended to replace advice given to you by your health care provider. Make sure you discuss any questions you have with your health care provider.   Document Released: 12/31/2004 Document Revised: 01/21/2014 Document Reviewed: 01/15/2011 Elsevier Interactive Patient Education [employer].

## 2015-03-07 ENCOUNTER — Ambulatory Visit (INDEPENDENT_AMBULATORY_CARE_PROVIDER_SITE_OTHER): Payer: BLUE CROSS/BLUE SHIELD

## 2015-03-07 ENCOUNTER — Ambulatory Visit
Admission: EM | Admit: 2015-03-07 | Discharge: 2015-03-07 | Disposition: A | Discharge status: Home / Self Care | Payer: BLUE CROSS/BLUE SHIELD | Attending: Family Medicine | Admitting: Family Medicine

## 2015-03-07 DIAGNOSIS — R911 Solitary pulmonary nodule: Secondary | ICD-10-CM

## 2015-03-07 DIAGNOSIS — R05 Cough: Secondary | ICD-10-CM | POA: Diagnosis not present

## 2015-03-07 DIAGNOSIS — R059 Cough, unspecified: Secondary | ICD-10-CM

## 2015-03-07 DIAGNOSIS — Z720 Tobacco use: Secondary | ICD-10-CM | POA: Diagnosis not present

## 2015-03-07 HISTORY — DX: Disorder of kidney and ureter, unspecified: N28.9

## 2015-03-07 MED ORDER — ALBUTEROL SULFATE HFA 108 (90 BASE) MCG/ACT IN AERS: 1.0000 | INHALATION_SPRAY | Freq: Four times a day (QID) | RESPIRATORY_TRACT | Status: DC | PRN

## 2015-03-07 MED ORDER — LEVOFLOXACIN 500 MG PO TABS: 500.0000 mg | ORAL_TABLET | Freq: Every day | ORAL | Status: DC

## 2015-03-07 NOTE — ED Notes (Signed)
Hx of Kidney transplant. Been sick with URI symptoms x 3-4 weeks. Seen here at [facility] last week but since then has began with + productive green cough

## 2015-03-07 NOTE — Discharge Instructions (Signed)
Cool Mist Vaporizers Vaporizers may help relieve the symptoms of a cough and cold. They add moisture to the air, which helps mucus to become thinner and less sticky. This makes it easier to breathe and cough up secretions. Cool mist vaporizers do not cause serious burns like hot mist vaporizers, which may also be called steamers or humidifiers. Vaporizers have not been proven to help with colds. You should not use a vaporizer if you are allergic to mold. HOME CARE INSTRUCTIONS  Follow the package instructions for the vaporizer.  Do not use anything other than distilled water in the vaporizer.  Do not run the vaporizer all of the time. This can cause mold or bacteria to grow in the vaporizer.  Clean the vaporizer after each time it is used.  Clean and dry the vaporizer well before storing it.  Stop using the vaporizer if worsening respiratory symptoms develop.   This information is not intended to replace advice given to you by your health care provider. Make sure you discuss any questions you have with your health care provider.   Document Released: 09/28/2003 Document Revised: 01/05/2013 Document Reviewed: 05/20/2012 Elsevier Interactive Patient Education 2016 Else[employer] Inc[employer]  Cough, Adult Coughing is a reflex that clears your throat and your airways. Coughing helps to heal and protect your lungs. It is normal to cough occasionally, but a cough that happens with other symptoms or lasts a long time may be a sign of a condition that needs treatment. A cough may last only 2-3 weeks (acute), or it may last longer than 8 weeks (chronic). CAUSES Coughing is commonly caused by:  Breathing in substances that irritate your lungs.  A viral or bacterial respiratory infection.  Allergies.  Asthma.  Postnasal drip.  Smoking.  Acid backing up from the stomach into the esophagus (gastroesophageal reflux).  Certain medicines.  Chronic lung problems, including COPD (or rarely, lung  cancer).  Other medical conditions such as heart failure. HOME CARE INSTRUCTIONS  Pay attention to any changes in your symptoms. Take these actions to help with your discomfort:  Take medicines only as told by your health care provider.  If you were prescribed an antibiotic medicine, take it as told by your health care provider. Do not stop taking the antibiotic even if you start to feel better.  Talk with your health care provider before you take a cough suppressant medicine.  Drink enough fluid to keep your urine clear or pale yellow.  If the air is dry, use a cold steam vaporizer or humidifier in your bedroom or your home to help loosen secretions.  Avoid anything that causes you to cough at work or at home.  If your cough is worse at night, try sleeping in a semi-upright position.  Avoid cigarette smoke. If you smoke, quit smoking. If you need help quitting, ask your health care provider.  Avoid caffeine.  Avoid alcohol.  Rest as needed. SEEK MEDICAL CARE IF:   You have new symptoms.  You cough up pus.  Your cough does not get better after 2-3 weeks, or your cough gets worse.  You cannot control your cough with suppressant medicines and you are losing sleep.  You develop pain that is getting worse or pain that is not controlled with pain medicines.  You have a fever.  You have unexplained weight loss.  You have night sweats. SEEK IMMEDIATE MEDICAL CARE IF:  You cough up blood.  You have difficulty breathing.  Your heartbeat is very fast.  This information is not intended to replace advice given to you by your health care provider. Make sure you discuss any questions you have with your health care provider.   Document Released: 06/29/2010 Document Revised: 09/21/2014 Document Reviewed: 03/09/2014 Elsevier Interactive Patient Education 2016 Elsevier Inc.  Pulmonary Nodule A pulmonary nodule is a small, round growth of tissue in the lung. Pulmonary nodules  can range in size from less than 1/5 inch (4 mm) to a little bigger than an inch (25 mm). Most pulmonary nodules are detected when imaging tests of the lung are being performed for a different problem. Pulmonary nodules are usually not cancerous (benign). However, some pulmonary nodules are cancerous (malignant). Follow-up treatment or testing is based on the size of the pulmonary nodule and your risk of getting lung cancer.  CAUSES Benign pulmonary nodules can be caused by various things. Some of the causes include:   Bacterial, fungal, or viral infections. This is usually an old infection that is no longer active, but it can sometimes be a current, active infection.  A benign mass of tissue.  Inflammation from conditions such as rheumatoid arthritis.   Abnormal blood vessels in the lungs. Malignant pulmonary nodules can result from lung cancer or from cancers that spread to the lung from other places in the body. SIGNS AND SYMPTOMS Pulmonary nodules usually do not cause symptoms. DIAGNOSIS Most often, pulmonary nodules are found incidentally when an X-ray or CT scan is performed to look for some other problem in the lung area. To help determine whether a pulmonary nodule is benign or malignant, your health care provider will take a medical history and order a variety of tests. Tests done may include:   Blood tests.  A skin test called a tuberculin test. This test is used to determine if you have been exposed to the germ that causes tuberculosis.   Chest X-rays. If possible, a new X-ray may be compared with X-rays you have had in the past.   CT scan. This test shows smaller pulmonary nodules more clearly than an X-ray.   Positron emission tomography (PET) scan. In this test, a safe amount of a radioactive substance is injected into the bloodstream. Then, the scan takes a picture of the pulmonary nodule. The radioactive substance is eliminated from your body in your urine.   Biopsy.  A tiny piece of the pulmonary nodule is removed so it can be checked under a microscope. TREATMENT  Pulmonary nodules that are benign normally do not require any treatment because they usually do not cause symptoms or breathing problems. Your health care provider may want to monitor the pulmonary nodule through follow-up CT scans. The frequency of these CT scans will vary based on the size of the nodule and the risk factors for lung cancer. For example, CT scans will need to be done more frequently if the pulmonary nodule is larger and if you have a history of smoking and a family history of cancer. Further testing or biopsies may be done if any follow-up CT scan shows that the size of the pulmonary nodule has increased. HOME CARE INSTRUCTIONS  Only take over-the-counter or prescription medicines as directed by your health care provider.  Keep all follow-up appointments with your health care provider. SEEK MEDICAL CARE IF:  You have trouble breathing when you are active.   You feel sick or unusually tired.   You do not feel like eating.   You lose weight without trying to.   You develop  chills or night sweats.  SEEK IMMEDIATE MEDICAL CARE IF:  You cannot catch your breath, or you begin wheezing.   You cannot stop coughing.   You cough up blood.   You become dizzy or feel like you are going to pass out.   You have sudden chest pain.   You have a fever or persistent symptoms for more than 2-3 days.   You have a fever and your symptoms suddenly get worse. MAKE SURE YOU:  Understand these instructions.  Will watch your condition.  Will get help right away if you are not doing well or get worse.   This information is not intended to replace advice given to you by your health care provider. Make sure you discuss any questions you have with your health care provider.   Document Released: 10/28/2008 Document Revised: 09/02/2012 Document Reviewed: 06/22/2012 Elsevier  Interactive Patient Education [employer].

## 2015-03-07 NOTE — ED Provider Notes (Signed)
CSN: [id number]     Arrival date & time 03/07/15  1750 History   First MD Initiated Contact with Patient 03/07/15 2056     Chief Complaint  Patient presents with  . Cough   (Consider location/radiation/quality/duration/timing/severity/associated sxs/prior Treatment) HPI   Patient returns and has been on Augmentin for about a week has had very little improvement. In addition he's had developed a cough and the infection seems to have gone into his lungs now. Is coughing up  green sputum. He continues to smoke up. He is not feeling well at all. He is afebrile pulse 66 respirations 18 blood pressure 142/67 PO2 97% on room air. He does have a history of a kidney transplant  Past Medical History  Diagnosis Date  . Hypertension   . Renal disorder    Past Surgical History  Procedure Laterality Date  . Kidney transplant     Family History  Problem Relation Age of Onset  . Cancer Mother   . Cancer Father    Social History  Substance Use Topics  . Smoking status: Current Every Day Smoker -- 1.00 packs/day    Types: Cigarettes  . Smokeless tobacco: None  . Alcohol Use: No    Review of Systems  Constitutional: Negative for fever, chills, activity change and fatigue.  HENT: Positive for congestion.   Respiratory: Positive for cough and shortness of breath.   All other systems reviewed and are negative.   Allergies  Review of patient's allergies indicates no known allergies.  Home Medications   Prior to Admission medications   Medication Sig Start Date End Date Taking? Authorizing Provider  amLODipine (NORVASC) 10 MG tablet Take 10 mg by mouth 2 (two) times daily.   Yes Historical Provider, MD  aspirin 81 MG tablet Take 81 mg by mouth daily.   Yes Historical Provider, MD  atorvastatin (LIPITOR) 20 MG tablet Take 20 mg by mouth daily.   Yes Historical Provider, MD  fluticasone (FLONASE) 50 MCG/ACT nasal spray Place 2 sprays into both nostrils daily. 02/28/15  Yes Lorin Picket,  PA-C  magnesium gluconate (MAGONATE) 500 MG tablet Take 500 mg by mouth 2 (two) times daily.   Yes Historical Provider, MD  metoprolol succinate (TOPROL-XL) 100 MG 24 hr tablet Take 37.5 mg by mouth daily. Take with or immediately following a meal.   Yes Historical Provider, MD  mycophenolate (MYFORTIC) 180 MG EC tablet Take 180 mg by mouth 2 (two) times daily.   Yes Historical Provider, MD  pantoprazole (PROTONIX) 40 MG tablet Take 40 mg by mouth daily.   Yes Historical Provider, MD  PARoxetine (PAXIL) 20 MG tablet Take 20 mg by mouth daily.   Yes Historical Provider, MD  tacrolimus (PROGRAF) 0.5 MG capsule Take 0.5 mg by mouth 2 (two) times daily.   Yes Historical Provider, MD  albuterol (PROVENTIL HFA;VENTOLIN HFA) 108 (90 Base) MCG/ACT inhaler Inhale 1-2 puffs into the lungs every 6 (six) hours as needed for wheezing or shortness of breath. 03/07/15   Lorin Picket, PA-C  amoxicillin-clavulanate (AUGMENTIN) 875-125 MG tablet Take 1 tablet by mouth every 12 (twelve) hours. 02/28/15   Lorin Picket, PA-C  cyclobenzaprine (FLEXERIL) 10 MG tablet Take 1 tablet (10 mg total) by mouth 3 (three) times daily as needed for muscle spasms. 11/21/14   Melynda Ripple, MD  HYDROcodone-acetaminophen (NORCO/VICODIN) 5-325 MG tablet Take 2 tablets by mouth every 4 (four) hours as needed for moderate pain. 11/21/14   Melynda Ripple, MD  levofloxacin (  LEVAQUIN) 500 MG tablet Take 1 tablet (500 mg total) by mouth daily. 03/07/15   Lorin Picket, PA-C   Meds Ordered and Administered this Visit  Medications - No data to display  BP 142/67 mmHg  Pulse 66  Temp(Src) 97.8 F (36.6 C) (Tympanic)  Resp 18  Ht 6' (1.829 m)  Wt 260 lb (117.935 kg)  BMI 35.25 kg/m2  SpO2 97% No data found.   Physical Exam  Constitutional: He is oriented to person, place, and time. He appears well-developed and well-nourished. No distress.  HENT:  Head: Normocephalic and atraumatic.  Right Ear: External ear normal.   Left Ear: External ear normal.  Nose: Nose normal.  Mouth/Throat: Oropharynx is clear and moist. No oropharyngeal exudate.  Eyes: Conjunctivae are normal. Pupils are equal, round, and reactive to light.  Neck: Normal range of motion. Neck supple.  Pulmonary/Chest:  Examination of the lungs does shows loud expiratory rhonchi in the right base. No crackles are appreciated. Good air movement bilaterally.  Musculoskeletal: Normal range of motion. He exhibits no edema or tenderness.  Neurological: He is alert and oriented to person, place, and time.  Skin: Skin is warm and dry. He is not diaphoretic.  Psychiatric: He has a normal mood and affect. His behavior is normal. Judgment and thought content normal.  Nursing note and vitals reviewed.   ED Course  Procedures (including critical care time)  Labs Review Labs Reviewed - No data to display  Imaging Review Dg Chest 2 View  03/07/2015  CLINICAL DATA:  Productive cough and congestion for 2 weeks. EXAM: CHEST  2 VIEW COMPARISON:  04/12/2014 FINDINGS: The lungs are clear wiithout focal pneumonia, edema, pneumothorax or pleural effusion. Interstitial markings are diffusely coarsened with chronic features. Nodular density projects over the left heart. The cardiopericardial silhouette is within normal limits for size. The visualized bony structures of the thorax are intact. IMPRESSION: 1. Nodular density projecting over the left heart border is new in the interval. CT chest without contrast recommended to further evaluate. 2. Stable underlying chronic interstitial changes. Electronically Signed   By: Misty Stanley M.D.   On: 03/07/2015 19:44     Visual Acuity Review  Right Eye Distance:   Left Eye Distance:   Bilateral Distance:    Right Eye Near:   Left Eye Near:    Bilateral Near:         MDM   1. Lung nodule seen on imaging study   2. Tobacco abuse   3. Cough    Discharge Medication List as of 03/07/2015  9:26 PM      Plan: 1. Test/x-ray results and diagnosis reviewed with patient 2. rx as per orders; risks, benefits, potential side effects reviewed with patient 3. Recommend supportive treatment with fluids. Because he is not improved on the Augmentin I will switch him now to Levaquin. The patient has taken Cipro on the past and has tolerated it well. We will obtain a CAT scan without contrast tomorrow to evaluate the nodule that was found today on the chest x-ray. A note was  written to keep him out of work for an additional 2-3 days. I've also given a prescription for albuterol because of his rhonchi and shortness of breath 4. F/u prn if symptoms worsen or don't improve     Lorin Picket, PA-C 03/07/15 2240

## 2015-03-08 ENCOUNTER — Other Ambulatory Visit: Payer: Self-pay | Admitting: Family Medicine

## 2015-03-08 ENCOUNTER — Ambulatory Visit
Admission: RE | Admit: 2015-03-08 | Discharge: 2015-03-08 | Disposition: A | Discharge status: Home / Self Care | Payer: BLUE CROSS/BLUE SHIELD | Source: Ambulatory Visit | Attending: Family Medicine | Admitting: Family Medicine

## 2015-03-08 DIAGNOSIS — R938 Abnormal findings on diagnostic imaging of other specified body structures: Secondary | ICD-10-CM | POA: Insufficient documentation

## 2015-03-08 DIAGNOSIS — R9389 Abnormal findings on diagnostic imaging of other specified body structures: Secondary | ICD-10-CM

## 2015-03-09 ENCOUNTER — Telehealth: Payer: Self-pay

## 2015-03-09 NOTE — ED Notes (Signed)
This nurse contacted patient regarding results of CT Chest done yesterday. Tamala Ser PA states he had attempted to contact patient several times last evening. Patient answered phone and states was given result yesterday "by some nurse". States has no questions and is following up with PMD.

## 2015-03-21 ENCOUNTER — Telehealth: Payer: Self-pay | Admitting: Family Medicine

## 2015-03-21 NOTE — Telephone Encounter (Signed)
Please let Billy Hughes know that I'd like to see patient for an appointment here in the office for:  Recent CT scan, follow-up Please schedule a visit with me  in the next: week or two Fasting?  Yes please, unless his cardiologist or someone else has done labs recently Thank you, Dr. Sanda Klein

## 2015-03-23 NOTE — Telephone Encounter (Signed)
Called patient and left vm to call us back to reschedule a f/u about his CT scan and also needs fasting labs if he has not had any recent ones at his cardiology.

## 2015-03-27 ENCOUNTER — Telehealth: Payer: Self-pay | Admitting: Family Medicine

## 2015-03-27 ENCOUNTER — Encounter: Payer: Self-pay | Admitting: Family Medicine

## 2015-03-27 NOTE — Telephone Encounter (Signed)
Left vm to call us back

## 2015-03-27 NOTE — Telephone Encounter (Signed)
Letter send out today.

## 2015-03-27 NOTE — Telephone Encounter (Signed)
Letter sent home today 03/27/15, patient no answer phone after 3x.

## 2016-03-05 ENCOUNTER — Ambulatory Visit (INDEPENDENT_AMBULATORY_CARE_PROVIDER_SITE_OTHER): Payer: BLUE CROSS/BLUE SHIELD

## 2016-03-05 ENCOUNTER — Ambulatory Visit
Admission: EM | Admit: 2016-03-05 | Discharge: 2016-03-05 | Disposition: A | Discharge status: Home / Self Care | Payer: BLUE CROSS/BLUE SHIELD | Attending: Family Medicine | Admitting: Family Medicine

## 2016-03-05 DIAGNOSIS — S60221A Contusion of right hand, initial encounter: Secondary | ICD-10-CM | POA: Diagnosis not present

## 2016-03-05 DIAGNOSIS — S060X0A Concussion without loss of consciousness, initial encounter: Secondary | ICD-10-CM

## 2016-03-05 DIAGNOSIS — S161XXA Strain of muscle, fascia and tendon at neck level, initial encounter: Secondary | ICD-10-CM | POA: Diagnosis not present

## 2016-03-05 DIAGNOSIS — W19XXXA Unspecified fall, initial encounter: Secondary | ICD-10-CM

## 2016-03-05 DIAGNOSIS — F0781 Postconcussional syndrome: Secondary | ICD-10-CM | POA: Insufficient documentation

## 2016-03-05 HISTORY — DX: Diverticulitis of intestine, part unspecified, without perforation or abscess without bleeding: K57.92

## 2016-03-05 HISTORY — DX: Gastro-esophageal reflux disease without esophagitis: K21.9

## 2016-03-05 MED ORDER — HYDROCODONE-ACETAMINOPHEN 5-325 MG PO TABS: ORAL_TABLET | ORAL | 0 refills | Status: DC

## 2016-03-05 MED ORDER — ACETAMINOPHEN 500 MG PO TABS
1000.0000 mg | ORAL_TABLET | Freq: Once | ORAL | Status: AC
Start: 2016-03-05 — End: 2016-03-05
  Administered 2016-03-05: 1000 mg via ORAL

## 2016-03-05 NOTE — ED Triage Notes (Signed)
Pt reports he fell over while putting a bowl of water down for the dogs and hit hit head on the tile floor and "jammed" two fingers in his right hand. Pain to index and third finger on right hand. Pain 7/10. Feels "fuzzy" today and feels nauseated.

## 2016-04-01 IMAGING — CT CT CHEST W/O CM
2 of 4 series · 15 of 36 positions shown, 18 images · non-contrast
Comparison: Chest x-ray of 03/07/2015

CLINICAL DATA: Questionable abnormality on chest x-ray at the left
lung base medially, cough, congestion, shortness of breath for 2-3
weeks

EXAM:
CT CHEST WITHOUT CONTRAST
TECHNIQUE: Multidetector CT imaging of the chest was performed following the
standard protocol without IV contrast.

[Series 3: lungs · axial · 0.81mm/px · z∈[-668,-368]mm · 12 of 72 slices shown, 15 images]
[im 6/72  mediastinal]
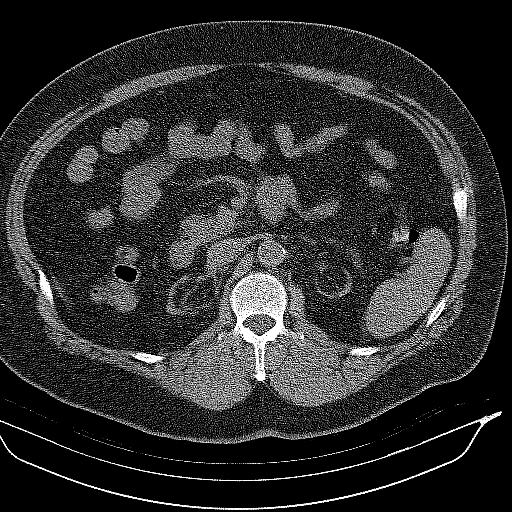
[im 6/72  lung]
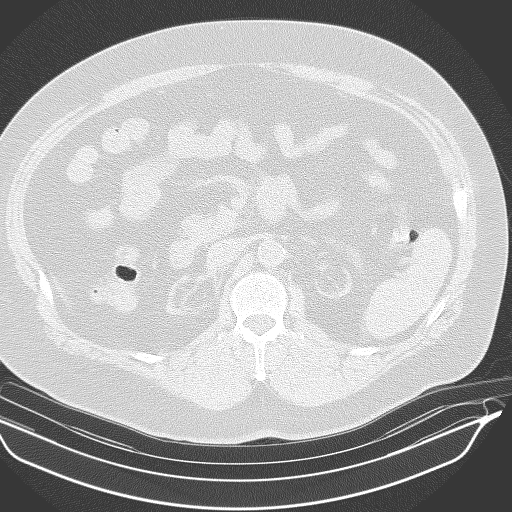
[im 11/72  lung]
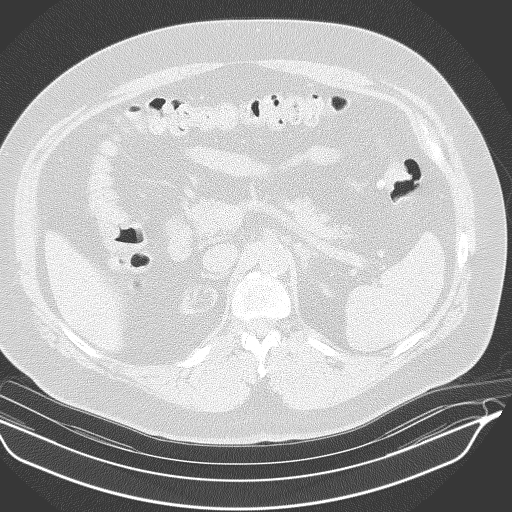
[im 17/72  lung]
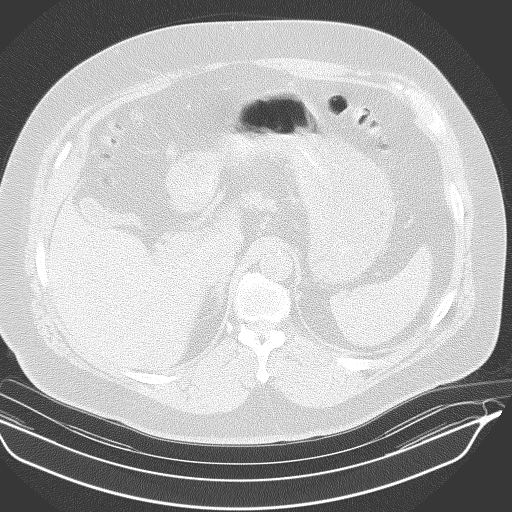
[im 22/72  lung]
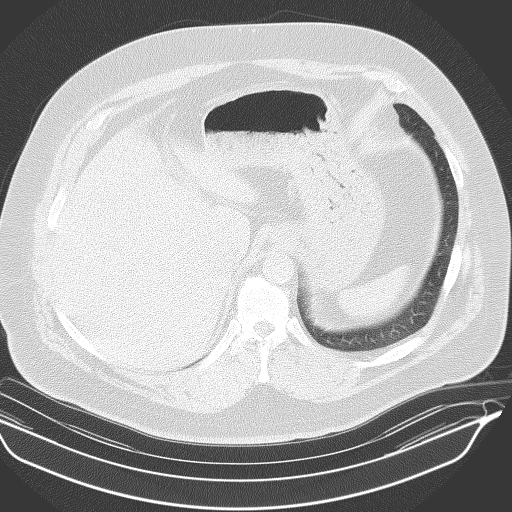
[im 28/72  mediastinal]
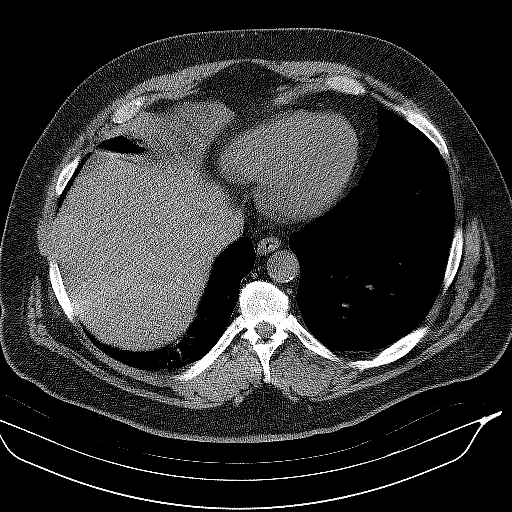
[im 28/72  lung]
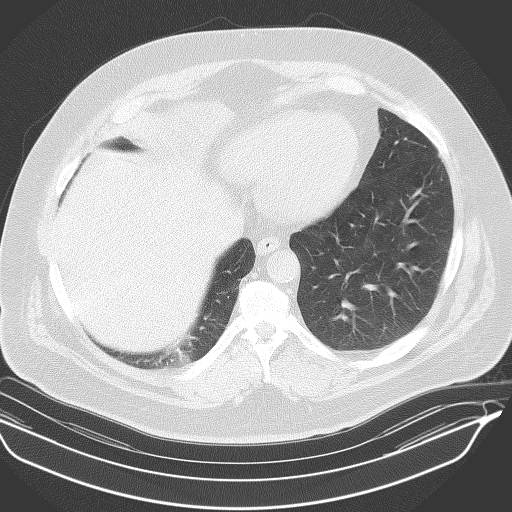
[im 33/72  lung]
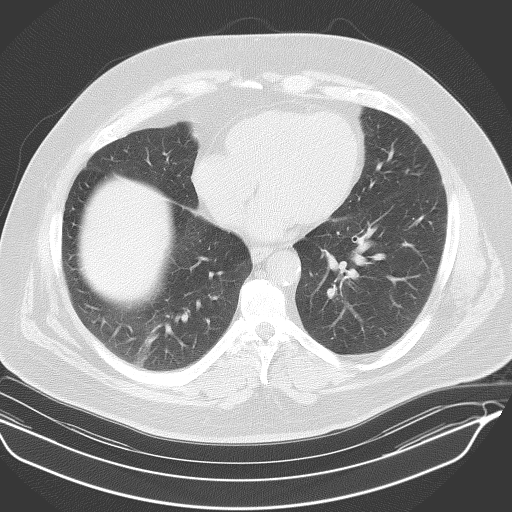
[im 39/72  lung]
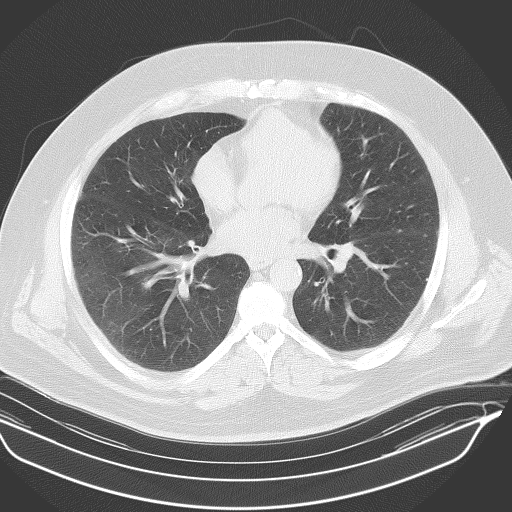
[im 44/72  lung]
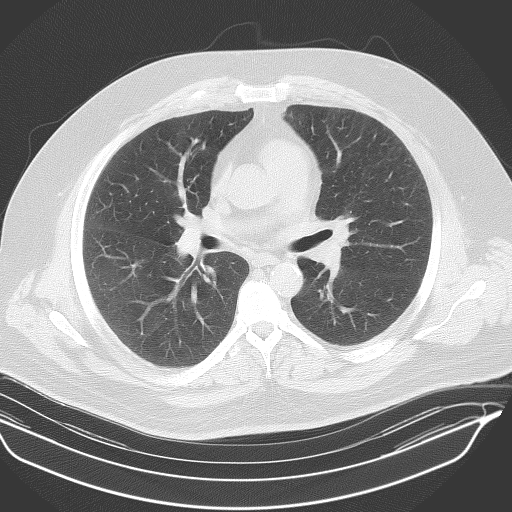
[im 50/72  mediastinal]
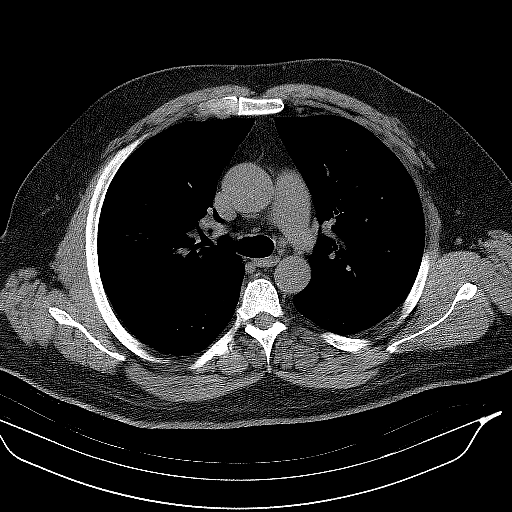
[im 50/72  lung]
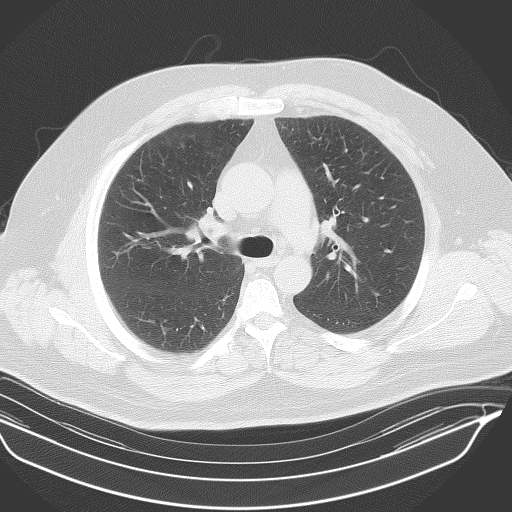
[im 55/72  lung]
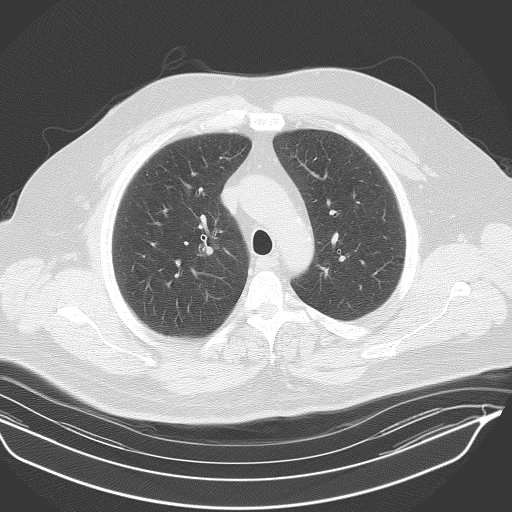
[im 61/72  lung]
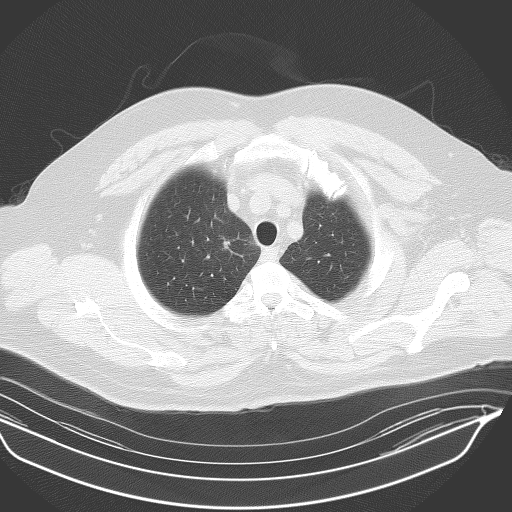
[im 66/72  lung]
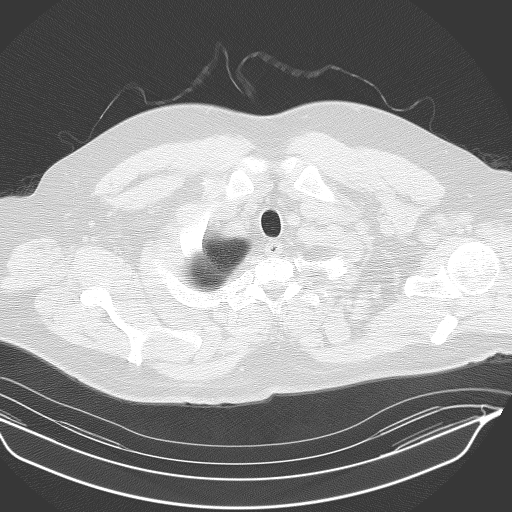

[Series 602: coronal · coronal · 0.81mm/px · 3 of 129 slices shown]
[im 26/129  lung]
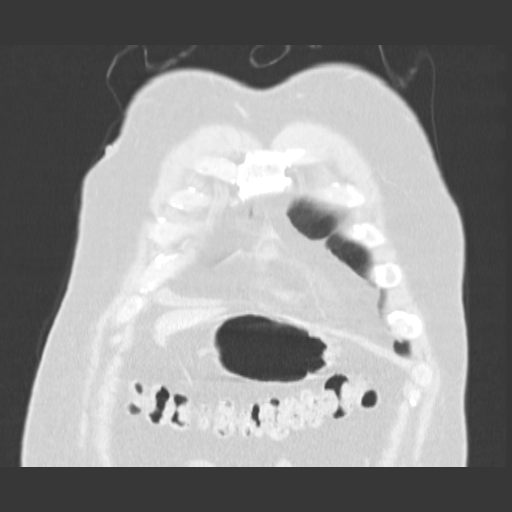
[im 52/129  lung]
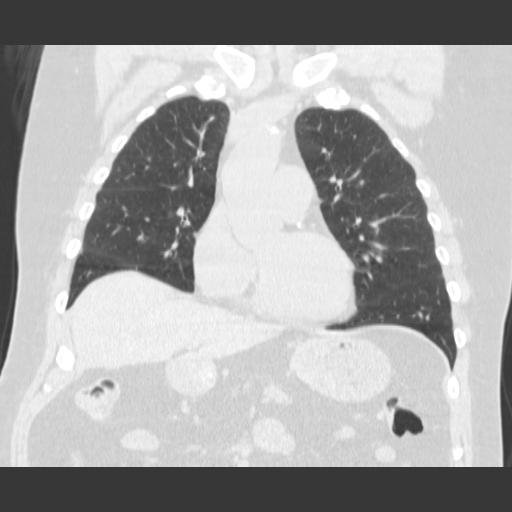
[im 77/129  lung]
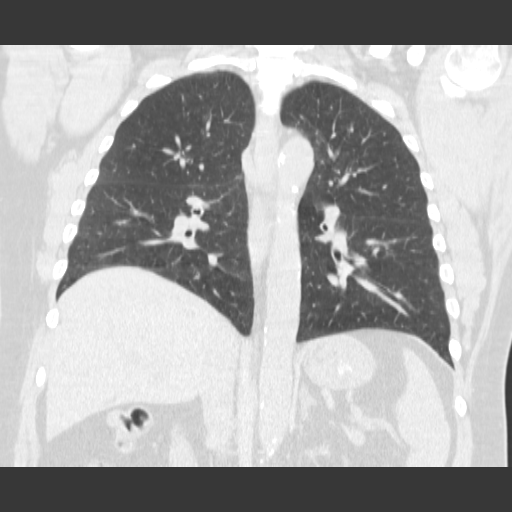

[15 of 36 positions shown; findings below may reference images not displayed]

FINDINGS: At the site in question on chest x-ray overlying the left heart
border, no parenchymal lesion is seen. Linear scarring is present in
the right lower lobe posterior medially. On lung window image number
21 there is a 3 mm noncalcified nodule within the right upper lobe
which is adjacent to the fissure may simply represent a perifissural
lymph node. On image 20 there is a 2 mm slightly dense nodule in the
left upper lobe most consistent with a tiny granuloma. No
parenchymal infiltrate is seen and no suspicious lung nodule is
noted. There is no evidence of pleural effusion. The central airway
is patent.

On soft tissue window images, the thyroid gland is unremarkable. On
this unenhanced study, small mediastinal lymph nodes are present
none of which appear pathologically enlarged. One of the larger
nodes is left peritracheal on image 22 measuring 10 mm in short axis
diameter. The ascending thoracic aorta measures 4.2 cm in diameter.
Recommend annual imaging followup by CTA or MRA. This recommendation
follows 3383 ACCF/AHA/AATS/ACR/ASA/SCA/EVENCIO/FEREGRINO/ARBELAEZ/HAGGI Guidelines
for the Diagnosis and Management of Patients with Thoracic Aortic
Disease. Circulation. 3383; 121: [id number]

Calcification is noted in the distribution of the left anterior
descending artery proximally. Scanning through the upper abdomen,
the kidneys are atrophic bilaterally. The thoracic vertebrae are in
normal alignment with degenerative change throughout the thoracic
spine. Atherosclerotic change is noted within the thoracic aorta.
IMPRESSION: 1. No abnormality is seen in the left lung base at the site
questioned by chest x-ray. This questioned abnormality most likely
represented overlapping vascularity.
2. The ascending aorta measures 4.2 cm. Followup recommendations are
given above.
3. Calcification in the distribution of the left anterior descending
artery.
4. Atrophic kidneys bilaterally.

## 2016-05-22 NOTE — ED Provider Notes (Signed)
[facility]    CSN: [id number] Arrival date & time: 03/05/16  1303     History   Chief Complaint Chief Complaint  Patient presents with  . Fall    HPI Billy Hughes is a 51 y.o. male.   51 yo male with a c/o a fall, hand pain and head injury. Denies any loss of consciousness, fevers, chills.     Fall  This is a new problem.    Past Medical History:  Diagnosis Date  . Diverticulitis   . GERD (gastroesophageal reflux disease)   . Hypertension   . Renal disorder     There are no active problems to display for this patient.   Past Surgical History:  Procedure Laterality Date  . COLON RESECTION    . DIALYSIS FISTULA CREATION    . KIDNEY TRANSPLANT    . NASAL RECONSTRUCTION         Home Medications    Prior to Admission medications   Medication Sig Start Date End Date Taking? Authorizing Provider  albuterol (PROVENTIL HFA;VENTOLIN HFA) 108 (90 Base) MCG/ACT inhaler Inhale 1-2 puffs into the lungs every 6 (six) hours as needed for wheezing or shortness of breath. 03/07/15   Lorin Picket, PA-C  amLODipine (NORVASC) 10 MG tablet Take 10 mg by mouth 2 (two) times daily.    [provider]  amoxicillin-clavulanate (AUGMENTIN) 875-125 MG tablet Take 1 tablet by mouth every 12 (twelve) hours. 02/28/15   Lorin Picket, PA-C  aspirin 81 MG tablet Take 81 mg by mouth daily.    [provider]  atorvastatin (LIPITOR) 20 MG tablet Take 20 mg by mouth daily.    [provider]  cyclobenzaprine (FLEXERIL) 10 MG tablet Take 1 tablet (10 mg total) by mouth 3 (three) times daily as needed for muscle spasms. 11/21/14   Melynda Ripple, MD  fluticasone (FLONASE) 50 MCG/ACT nasal spray Place 2 sprays into both nostrils daily. 02/28/15   Lorin Picket, PA-C  HYDROcodone-acetaminophen (NORCO/VICODIN) 5-325 MG tablet 1-2 tabs po q 8 hours prn 03/05/16   Norval Gable, MD  levofloxacin (LEVAQUIN) 500 MG tablet Take 1 tablet (500  mg total) by mouth daily. 03/07/15   Lorin Picket, PA-C  magnesium gluconate (MAGONATE) 500 MG tablet Take 500 mg by mouth 2 (two) times daily.    [provider]  metoprolol succinate (TOPROL-XL) 100 MG 24 hr tablet Take 37.5 mg by mouth daily. Take with or immediately following a meal.    [provider]  mycophenolate (MYFORTIC) 180 MG EC tablet Take 180 mg by mouth 2 (two) times daily.    [provider]  pantoprazole (PROTONIX) 40 MG tablet Take 40 mg by mouth daily.    [provider]  PARoxetine (PAXIL) 20 MG tablet Take 20 mg by mouth daily.    [provider]  tacrolimus (PROGRAF) 0.5 MG capsule Take 0.5 mg by mouth 2 (two) times daily.    [provider]    Family History Family History  Problem Relation Age of Onset  . Cancer Mother   . Cancer Father     Social History Social History  Substance Use Topics  . Smoking status: Current Every Day Smoker    Packs/day: 1.00    Types: Cigarettes  . Smokeless tobacco: Never Used  . Alcohol use Yes     Comment: social     Allergies   Patient has no known allergies.   Review of Systems  Review of Systems   Physical Exam Triage Vital Signs ED Triage Vitals  Enc Vitals Group     BP 03/05/16 1313 (!) 152/77     Pulse Rate 03/05/16 1313 69     Resp 03/05/16 1313 18     Temp 03/05/16 1313 98 F (36.7 C)     Temp Source 03/05/16 1313 Oral     SpO2 03/05/16 1313 97 %     Weight 03/05/16 1317 255 lb (115.7 kg)     Height 03/05/16 1317 6' (1.829 m)     Head Circumference --      Peak Flow --      Pain Score 03/05/16 1321 7     Pain Loc --      Pain Edu? --      Excl. in [place]? --    No data found.   Updated Vital Signs BP (!) 152/77 (BP Location: Right Arm)   Pulse 69   Temp 98 F (36.7 C) (Oral)   Resp 18   Ht 6' (1.829 m)   Wt 255 lb (115.7 kg)   SpO2 97%   BMI 34.58 kg/m   Visual Acuity Right Eye Distance:   Left Eye Distance:   Bilateral  Distance:    Right Eye Near:   Left Eye Near:    Bilateral Near:     Physical Exam  Constitutional: He is oriented to person, place, and time. He appears well-developed and well-nourished. No distress.  HENT:  Head: Normocephalic and atraumatic.  Right Ear: Tympanic membrane, external ear and ear canal normal.  Left Ear: Tympanic membrane, external ear and ear canal normal.  Nose: Nose normal.  Mouth/Throat: Uvula is midline, oropharynx is clear and moist and mucous membranes are normal. No oropharyngeal exudate or tonsillar abscesses.  Eyes: Conjunctivae and EOM are normal. Pupils are equal, round, and reactive to light. Right eye exhibits no discharge. Left eye exhibits no discharge. No scleral icterus.  Neck: Normal range of motion. Neck supple. No tracheal deviation present. No thyromegaly present.  Cardiovascular: Normal rate, regular rhythm and normal heart sounds.   Pulmonary/Chest: Effort normal and breath sounds normal. No stridor. No respiratory distress. He has no wheezes. He has no rales. He exhibits no tenderness.  Musculoskeletal:       Right hand: He exhibits tenderness, bony tenderness and swelling. He exhibits normal range of motion, normal two-point discrimination, normal capillary refill, no deformity and no laceration. Normal sensation noted. Normal strength noted.  Tenderness to palpation over the trapezius muscle bilaterally  Lymphadenopathy:    He has no cervical adenopathy.  Neurological: He is alert and oriented to person, place, and time. He displays normal reflexes. No cranial nerve deficit or sensory deficit. He exhibits normal muscle tone. Coordination normal.  Skin: Skin is warm and dry. No rash noted. He is not diaphoretic.  Nursing note and vitals reviewed.    UC Treatments / Results  Labs (all labs ordered are listed, but only abnormal results are displayed) Labs Reviewed - No data to display  EKG  EKG Interpretation None       Radiology No  results found.  Procedures Procedures (including critical care time)  Medications Ordered in UC Medications  acetaminophen (TYLENOL) tablet 1,000 mg (1,000 mg Oral Given 03/05/16 1458)     Initial Impression / Assessment and Plan / UC Course  I have reviewed the triage vital signs and the nursing notes.  Pertinent labs & imaging results that were available during  my care of the patient were reviewed by me and considered in my medical decision making (see chart for details).       Final Clinical Impressions(s) / UC Diagnoses   Final diagnoses:  Fall, initial encounter  Concussion without loss of consciousness, initial encounter  Contusion of right hand, initial encounter  Strain of neck muscle, initial encounter    New Prescriptions Discharge Medication List as of 03/05/2016  2:51 PM     1. x-ray results and diagnosis reviewed with patient/parent/guardian/family 2. rx as per orders above; reviewed possible side effects, interactions, risks and benefits;rx given/printed for vicodin prn pain  3. Recommend supportive treatment with rest, ice 4. Follow-up prn if symptoms worsen or don't improve   Norval Gable, MD 05/22/16 2125

## 2017-02-10 ENCOUNTER — Emergency Department
Admission: EM | Admit: 2017-02-10 | Discharge: 2017-02-10 | Disposition: A | Discharge status: Home / Self Care | Payer: BLUE CROSS/BLUE SHIELD | Attending: Emergency Medicine | Admitting: Emergency Medicine

## 2017-02-10 ENCOUNTER — Ambulatory Visit
Admission: RE | Admit: 2017-02-10 | Discharge: 2017-02-10 | Disposition: A | Discharge status: Home / Self Care | Payer: BLUE CROSS/BLUE SHIELD | Source: Ambulatory Visit | Attending: Family Medicine | Admitting: Family Medicine

## 2017-02-10 ENCOUNTER — Other Ambulatory Visit: Payer: Self-pay | Admitting: Family Medicine

## 2017-02-10 ENCOUNTER — Encounter: Payer: Self-pay | Admitting: Emergency Medicine

## 2017-02-10 ENCOUNTER — Other Ambulatory Visit: Payer: Self-pay

## 2017-02-10 DIAGNOSIS — M79662 Pain in left lower leg: Secondary | ICD-10-CM

## 2017-02-10 DIAGNOSIS — Z7901 Long term (current) use of anticoagulants: Secondary | ICD-10-CM | POA: Diagnosis not present

## 2017-02-10 DIAGNOSIS — I82412 Acute embolism and thrombosis of left femoral vein: Secondary | ICD-10-CM | POA: Diagnosis not present

## 2017-02-10 DIAGNOSIS — I10 Essential (primary) hypertension: Secondary | ICD-10-CM | POA: Insufficient documentation

## 2017-02-10 DIAGNOSIS — I82432 Acute embolism and thrombosis of left popliteal vein: Secondary | ICD-10-CM | POA: Insufficient documentation

## 2017-02-10 DIAGNOSIS — F17210 Nicotine dependence, cigarettes, uncomplicated: Secondary | ICD-10-CM | POA: Insufficient documentation

## 2017-02-10 DIAGNOSIS — I82402 Acute embolism and thrombosis of unspecified deep veins of left lower extremity: Secondary | ICD-10-CM | POA: Insufficient documentation

## 2017-02-10 DIAGNOSIS — E785 Hyperlipidemia, unspecified: Secondary | ICD-10-CM | POA: Insufficient documentation

## 2017-02-10 DIAGNOSIS — F3341 Major depressive disorder, recurrent, in partial remission: Secondary | ICD-10-CM | POA: Insufficient documentation

## 2017-02-10 DIAGNOSIS — K219 Gastro-esophageal reflux disease without esophagitis: Secondary | ICD-10-CM | POA: Insufficient documentation

## 2017-02-10 DIAGNOSIS — M7989 Other specified soft tissue disorders: Principal | ICD-10-CM

## 2017-02-10 DIAGNOSIS — Z79899 Other long term (current) drug therapy: Secondary | ICD-10-CM | POA: Diagnosis not present

## 2017-02-10 DIAGNOSIS — Z7982 Long term (current) use of aspirin: Secondary | ICD-10-CM | POA: Diagnosis not present

## 2017-02-10 MED ORDER — RIVAROXABAN 15 MG PO TABS
15.0000 mg | ORAL_TABLET | Freq: Once | ORAL | Status: AC
  Administered 2017-02-10: 15 mg via ORAL
  Filled 2017-02-10: qty 1

## 2017-02-10 MED ORDER — RIVAROXABAN (XARELTO) VTE STARTER PACK (15 & 20 MG): ORAL_TABLET | ORAL | 0 refills | Status: DC

## 2017-02-10 MED ORDER — OXYCODONE HCL 5 MG PO TABS: 5.0000 mg | ORAL_TABLET | Freq: Three times a day (TID) | ORAL | 0 refills | Status: DC | PRN

## 2017-02-10 MED ORDER — OXYCODONE-ACETAMINOPHEN 5-325 MG PO TABS
1.0000 | ORAL_TABLET | Freq: Once | ORAL | Status: AC
  Administered 2017-02-10: 1 via ORAL
  Filled 2017-02-10: qty 1

## 2017-02-10 NOTE — ED Provider Notes (Signed)
[facility] Emergency Department Provider Note ____________________________________________  Time seen: Approximately 6:21 PM  I have reviewed the triage vital signs and the nursing notes.   HISTORY  Chief Complaint DVT    HPI Billy Hughes is a 52 y.o. male who presents to the emergency department for treatment of a DVT in the left lower extremity.  He had an outpatient ultrasound today.  His primary care provider sent him to the emergency department for treatment.  He states that he began to have pain in the left lower extremity approximately 1 week ago and it has increased over the past 24 hours.  He states that his left leg is more than a centimeter larger in diameter than his right leg.  He states that he has never had a DVT in the past.  He is a renal transplant.  He reports being religiously compliant with his follow-ups and recommendations.  He last had blood work on January 30, 2017 which was baseline for him.  He denies chest pain or shortness of breath.  No alleviating measures have been attempted for the pain.  Past Medical History:  Diagnosis Date  . Diverticulitis   . GERD (gastroesophageal reflux disease)   . Hypertension   . Renal disorder     There are no active problems to display for this patient.   Past Surgical History:  Procedure Laterality Date  . COLON RESECTION    . DIALYSIS FISTULA CREATION    . KIDNEY TRANSPLANT    . NASAL RECONSTRUCTION      Prior to Admission medications   Medication Sig Start Date End Date Taking? Authorizing Provider  albuterol (PROVENTIL HFA;VENTOLIN HFA) 108 (90 Base) MCG/ACT inhaler Inhale 1-2 puffs into the lungs every 6 (six) hours as needed for wheezing or shortness of breath. 03/07/15   Lorin Picket, PA-C  amLODipine (NORVASC) 10 MG tablet Take 10 mg by mouth 2 (two) times daily.    [provider]  amoxicillin-clavulanate (AUGMENTIN) 875-125 MG tablet Take 1 tablet by mouth every  12 (twelve) hours. 02/28/15   Lorin Picket, PA-C  aspirin 81 MG tablet Take 81 mg by mouth daily.    [provider]  atorvastatin (LIPITOR) 20 MG tablet Take 20 mg by mouth daily.    [provider]  cyclobenzaprine (FLEXERIL) 10 MG tablet Take 1 tablet (10 mg total) by mouth 3 (three) times daily as needed for muscle spasms. 11/21/14   Melynda Ripple, MD  fluticasone (FLONASE) 50 MCG/ACT nasal spray Place 2 sprays into both nostrils daily. 02/28/15   Lorin Picket, PA-C  HYDROcodone-acetaminophen (NORCO/VICODIN) 5-325 MG tablet 1-2 tabs po q 8 hours prn 03/05/16   Norval Gable, MD  levofloxacin (LEVAQUIN) 500 MG tablet Take 1 tablet (500 mg total) by mouth daily. 03/07/15   Lorin Picket, PA-C  magnesium gluconate (MAGONATE) 500 MG tablet Take 500 mg by mouth 2 (two) times daily.    [provider]  metoprolol succinate (TOPROL-XL) 100 MG 24 hr tablet Take 37.5 mg by mouth daily. Take with or immediately following a meal.    [provider]  mycophenolate (MYFORTIC) 180 MG EC tablet Take 180 mg by mouth 2 (two) times daily.    [provider]  oxyCODONE (ROXICODONE) 5 MG immediate release tablet Take 1 tablet (5 mg total) by mouth every 8 (eight) hours as needed. 02/10/17 02/10/18  Graceyn Fodor, Johnette Abraham B, FNP  pantoprazole (PROTONIX) 40 MG tablet Take 40 mg by  mouth daily.    [provider]  PARoxetine (PAXIL) 20 MG tablet Take 20 mg by mouth daily.    [provider]  Rivaroxaban 15 & 20 MG TBPK Take as directed on package: Start with one 15mg  tablet by mouth twice a day with food. On Day 22, switch to one 20mg  tablet once a day with food. 02/10/17   Amena Dockham B, FNP  tacrolimus (PROGRAF) 0.5 MG capsule Take 0.5 mg by mouth 2 (two) times daily.    [provider]    Allergies Patient has no known allergies.  Family History  Problem Relation Age of Onset  . Cancer Mother   . Cancer Father     Social  History Social History   Tobacco Use  . Smoking status: Current Every Day Smoker    Packs/day: 1.00    Types: Cigarettes  . Smokeless tobacco: Never Used  Substance Use Topics  . Alcohol use: Yes    Comment: social  . Drug use: No    Review of Systems Constitutional: Negative for recent injury. Cardiovascular: Negative for chest pain Respiratory: Negative for shortness of breath Musculoskeletal: Positive for left lower extremity pain Skin: Positive for left lower leg edema without erythema Neurological: Negative for paresthesias  ____________________________________________   PHYSICAL EXAM:  VITAL SIGNS: ED Triage Vitals  Enc Vitals Group     BP 02/10/17 1816 130/78     Pulse Rate 02/10/17 1816 73     Resp 02/10/17 1816 18     Temp 02/10/17 1816 97.7 F (36.5 C)     Temp src --      SpO2 02/10/17 1816 98 %     Weight 02/10/17 1748 265 lb (120.2 kg)     Height 02/10/17 1748 6' (1.829 m)     Head Circumference --      Peak Flow --      Pain Score 02/10/17 1748 8     Pain Loc --      Pain Edu? --      Excl. in [place]? --     Constitutional: Alert and oriented. Well appearing and in no acute distress. Eyes: Conjunctivae are clear without discharge or drainage Head: Atraumatic Neck: Supple.  Active range of motion observed. Respiratory: Respirations even and unlabored.  Breath sounds clear to auscultation throughout. Musculoskeletal: Patient has full range of motion of extremities, specifically of the left lower extremity.  There is tenderness upon palpation of the popliteal area, but no bony tenderness over the tibia or fibula.  Patient has full range of motion of the left foot and ankle. Neurologic: Motor and sensory function is intact. Skin: Warm, dry, no erythema noted, specifically of the left lower extremity. Psychiatric: Affect and behavior are appropriate.  ____________________________________________   LABS (all labs ordered are listed, but only abnormal  results are displayed)  Labs Reviewed - No data to display ____________________________________________  RADIOLOGY  Ultrasound results from outpatient testing were reviewed.  Patient has an occlusive DVT of the left lower extremity in the popliteal and a partial occlusion in the femoral per radiology. ____________________________________________   PROCEDURES  Procedures  ____________________________________________   INITIAL IMPRESSION / ASSESSMENT AND PLAN / ED COURSE  Kham Zuckerman is a 52 y.o. male who presents to the emergency department for treatment and evaluation of left lower extremity pain secondary to DVT diagnosed on outpatient ultrasound.  Patient was able to produce the lab results from 01/30/2017 which shows his BUN and creatinine within acceptable  ranges to start outpatient Xarelto.  First dose was given while here in the emergency department.  A welcome packet from the company was also provided with discount cards.  He will be treated with a short round of Roxicet for his pain.  He is to call and schedule a follow-up appointment with vascular.  He is to return to the emergency department for any symptoms of concern but specifically chest pain or shortness of breath.  Medications  Rivaroxaban (XARELTO) tablet 15 mg (15 mg Oral Given 02/10/17 1923)  oxyCODONE-acetaminophen (PERCOCET/ROXICET) 5-325 MG per tablet 1 tablet (1 tablet Oral Given 02/10/17 1923)    Pertinent labs & imaging results that were available during my care of the patient were reviewed by me and considered in my medical decision making (see chart for details).  _________________________________________   FINAL CLINICAL IMPRESSION(S) / ED DIAGNOSES  Final diagnoses:  Acute deep vein thrombosis (DVT) of popliteal vein of left lower extremity (HCC)  DVT of deep femoral vein, left [facility])    ED Discharge Orders        Ordered    Rivaroxaban 15 & 20 MG TBPK     02/10/17 1855    oxyCODONE  (ROXICODONE) 5 MG immediate release tablet  Every 8 hours PRN     02/10/17 1855       If controlled substance prescribed during this visit, 12 month history viewed on the [place] prior to issuing an initial prescription for Schedule II or III opiod.    Victorino Dike, FNP 02/10/17 2318    Nance Pear, MD 02/10/17 [phone]

## 2017-02-10 NOTE — ED Notes (Signed)
See triage note  Presents with pain and swelling to left left for about 2 weeks   Was sent in for u/s  Pos. For DVT

## 2017-02-10 NOTE — ED Triage Notes (Signed)
Here for DVT via [place].  Pain to LLE for 1 week without swelling. Per results part of clot is occlusive.  No SHOB, CP.  Unlabored.

## 2017-02-28 ENCOUNTER — Ambulatory Visit (INDEPENDENT_AMBULATORY_CARE_PROVIDER_SITE_OTHER): Payer: BLUE CROSS/BLUE SHIELD | Admitting: Vascular Surgery

## 2017-02-28 ENCOUNTER — Encounter (INDEPENDENT_AMBULATORY_CARE_PROVIDER_SITE_OTHER): Payer: Self-pay | Admitting: Vascular Surgery

## 2017-02-28 VITALS — BP 147/85 | HR 73 | Resp 17 | Ht 72.0 in | Wt 278.0 lb

## 2017-02-28 DIAGNOSIS — N289 Disorder of kidney and ureter, unspecified: Secondary | ICD-10-CM | POA: Insufficient documentation

## 2017-02-28 DIAGNOSIS — I82432 Acute embolism and thrombosis of left popliteal vein: Secondary | ICD-10-CM | POA: Diagnosis not present

## 2017-02-28 DIAGNOSIS — I10 Essential (primary) hypertension: Secondary | ICD-10-CM | POA: Diagnosis not present

## 2017-02-28 DIAGNOSIS — I82409 Acute embolism and thrombosis of unspecified deep veins of unspecified lower extremity: Secondary | ICD-10-CM | POA: Insufficient documentation

## 2017-02-28 NOTE — Patient Instructions (Signed)
Deep Vein Thrombosis Deep vein thrombosis (DVT) is a condition in which a blood clot forms in a deep vein, such as a lower leg, thigh, or arm vein. A clot is blood that has thickened into a gel or solid. This condition is dangerous. It can lead to serious and even life-threatening complications if the clot travels to the lungs and causes a blockage (pulmonary embolism). It can also damage veins in the leg. This can result in leg pain, swelling, discoloration, and sores (post-thrombotic syndrome). What are the causes? This condition may be caused by:  A slowdown of blood flow.  Damage to a vein.  A condition that makes blood clot more easily.  What increases the risk? The following factors may make you more likely to develop this condition:  Being overweight.  Being elderly, especially over age 60.  Sitting or lying down for more than four hours.  Lack of physical activity (sedentary lifestyle).  Being pregnant, giving birth, or having recently given birth.  Taking medicines that contain estrogen.  Smoking.  A history of any of the following: ? Blood clots or blood clotting disease. ? Peripheral vascular disease. ? Inflammatory bowel disease. ? Cancer. ? Heart disease. ? Genetic conditions that affect how blood clots. ? Neurological diseases that affect the legs (leg paresis). ? Injury. ? Major or lengthy surgery. ? A central line placed inside a large vein.  What are the signs or symptoms? Symptoms of this condition include:  Swelling, pain, or tenderness in an arm or leg.  Warmth, redness, or discoloration in an arm or leg.  If the clot is in your leg, symptoms may be more noticeable or worse when you stand or walk. Some people do not have any symptoms. How is this diagnosed? This condition is diagnosed with:  A medical history.  A physical exam.  Tests, such as: ? Blood tests. These are done to see how your blood clots. ? Imaging tests. These are done to  check for clots. Tests may include:  Ultrasound.  CT scan.  MRI.  X-ray.  Venogram. For this test, X-rays are taken after a dye is injected into a vein.  How is this treated? Treatment for this condition depends on the cause, your risk for bleeding or developing more clots, and any medical conditions you have. Treatment may include:  Taking blood thinners (also called anticoagulants). These medicines may be taken by mouth, injected under the skin, or injected through an IV tube (catheter). These medicines prevent clots from forming.  Injecting medicine that dissolves blood clots into the affected vein (catheter-directed thrombolysis).  Having surgery. Surgery may be done to: ? Remove the clot. ? Place a filter in a large vein to catch blood clots before they reach the lungs.  Some treatments may be continued for up to six months. Follow these instructions at home: If you are taking an oral blood thinner:  Take the medicine exactly as told by your health care provider. Some blood thinners need to be taken at the same time every day. Do not skip a dose.  Ask your health care provider about what foods and drugs interact with the medicine.  Ask about possible side effects. General instructions  Blood thinners can cause easy bruising and difficulty stopping bleeding. Because of this, if you are taking or were given a blood thinner: ? Hold pressure over cuts for longer than usual. ? Tell your dentist and other health care providers that you are taking blood thinners before   having any procedures that can cause bleeding. ? Avoid contact sports.  Take over-the-counter and prescription medicines only as told by your health care provider.  Return to your normal activities as told by your health care provider. Ask your health care provider what activities are safe for you.  Wear compression stockings if recommended by your health care provider.  Keep all follow-up visits as told by  your health care provider. This is important. How is this prevented? To lower your risk of developing this condition again:  For 30 or more minutes every day, do an activity that: ? Involves moving your arms and legs. ? Increases your heart rate.  When traveling for longer than four hours: ? Exercise your arms and legs every hour. ? Drink plenty of water. ? Avoid drinking alcohol.  Avoid sitting or lying for a long time without moving your legs.  Stay a healthy weight.  If you are a woman who is older than age 35, avoid unnecessary use of medicines that contain estrogen.  Do not use any products that contain nicotine or tobacco, such as cigarettes and e-cigarettes. This is especially important if you take estrogen medicines. If you need help quitting, ask your health care provider.  Contact a health care provider if:  You miss a dose of your blood thinner.  You have nausea, vomiting, or diarrhea that lasts for more than one day.  Your menstrual period is heavier than usual.  You have unusual bruising. Get help right away if:  You have new or increased pain, swelling, or redness in an arm or leg.  You have numbness or tingling in an arm or leg.  You have shortness of breath.  You have chest pain.  You have a rapid or irregular heartbeat.  You feel light-headed or dizzy.  You cough up blood.  There is blood in your vomit, stool, or urine.  You have a serious fall or accident, or you hit your head.  You have a severe headache or confusion.  You have a cut that will not stop bleeding. These symptoms may represent a serious problem that is an emergency. Do not wait to see if the symptoms will go away. Get medical help right away. Call your local emergency services (911 in the [place] Do not drive yourself to the hospital. Summary  DVT is a condition in which a blood clot forms in a deep vein, such as a lower leg, thigh, or arm vein.  Symptoms can include swelling,  warmth, pain, and redness in your leg or arm.  Treatment may include taking blood thinners, injecting medicine that dissolves blood clots,wearing compression stockings, or surgery.  If you are prescribed blood thinners, take them exactly as told. This information is not intended to replace advice given to you by your health care provider. Make sure you discuss any questions you have with your health care provider. Document Released: 12/31/2004 Document Revised: 02/03/2016 Document Reviewed: 02/03/2016 Elsevier Interactive Patient Education  2018 [employer]  

## 2017-02-28 NOTE — Assessment & Plan Note (Signed)
blood pressure control important in reducing the progression of atherosclerotic disease. On appropriate oral medications.  

## 2017-02-28 NOTE — Assessment & Plan Note (Signed)
The patient had a popliteal and distal femoral DVT diagnosed about 3 weeks ago.  He has responded well to therapy and has not had bleeding on Xarelto.  We had a long discussion about the natural history and pathophysiology of DVT.  He will need 6 months of anticoagulation for his left lower extremity DVT.  So far, he has tolerated anticoagulation well.  He should increase his activity as tolerated and wear compression stockings as tolerated.  No repeat ultrasound will be performed unless his symptoms worsen.  I will plan on seeing him back in 5-6 months to discuss cessation of anticoagulation at that time.

## 2017-02-28 NOTE — Progress Notes (Signed)
Patient ID: Billy Hughes, male   DOB: 1965-07-31, 52 y.o.   MRN: [id number]  Chief Complaint  Patient presents with  . New Patient (Initial Visit)    HPI Billy Hughes is a 52 y.o. male.  I am asked to see the patient by Sherrie George in the [facility] ED for evaluation of left lower extremity DVT he went to the emergency room about 2-3 weeks ago after developing worsening pain and swelling in the left leg..  The patient reports he had about 6-7 days of symptoms prior to going to the emergency room.  He had no chest pain or shortness of breath.  He had no fever or chills.  He was found to have DVT in the popliteal and distal superficial femoral vein on that duplex.  He was started on Xarelto which have significantly helped his lower extremity symptoms.  He has minimal swelling at this point and mild pain.  There is no clear inciting event or causative factor that started the symptoms.  He had not had prolonged immobility, trauma, surgery, or recent injury.  He has no previous history of blood clots.  He has no right leg symptoms.   Past Medical History:  Diagnosis Date  . Diverticulitis   . GERD (gastroesophageal reflux disease)   . Hypertension   . Renal disorder     Past Surgical History:  Procedure Laterality Date  . COLON RESECTION    . DIALYSIS FISTULA CREATION    . KIDNEY TRANSPLANT    . NASAL RECONSTRUCTION      Family History  Problem Relation Age of Onset  . Cancer Mother   . Cancer Father   no bleeding or clotting disorders  Social History Social History   Tobacco Use  . Smoking status: Current Every Day Smoker    Packs/day: 1.00    Types: Cigarettes  . Smokeless tobacco: Never Used  Substance Use Topics  . Alcohol use: Yes    Comment: social  . Drug use: No     No Known Allergies  Current Outpatient Medications  Medication Sig Dispense Refill  . albuterol (PROVENTIL HFA;VENTOLIN HFA) 108 (90 Base) MCG/ACT inhaler Inhale 1-2 puffs into the  lungs every 6 (six) hours as needed for wheezing or shortness of breath. 1 Inhaler 0  . amLODipine (NORVASC) 10 MG tablet Take 10 mg by mouth 2 (two) times daily.    Marland Kitchen amoxicillin-clavulanate (AUGMENTIN) 875-125 MG tablet Take 1 tablet by mouth every 12 (twelve) hours. 20 tablet 0  . aspirin 81 MG tablet Take 81 mg by mouth daily.    Marland Kitchen atorvastatin (LIPITOR) 20 MG tablet Take 20 mg by mouth daily.    . cyclobenzaprine (FLEXERIL) 10 MG tablet Take 1 tablet (10 mg total) by mouth 3 (three) times daily as needed for muscle spasms. 20 tablet 0  . fluticasone (FLONASE) 50 MCG/ACT nasal spray Place 2 sprays into both nostrils daily. 16 g 0  . levofloxacin (LEVAQUIN) 500 MG tablet Take 1 tablet (500 mg total) by mouth daily. 10 tablet 0  . magnesium gluconate (MAGONATE) 500 MG tablet Take 500 mg by mouth 2 (two) times daily.    . metoprolol succinate (TOPROL-XL) 100 MG 24 hr tablet Take 37.5 mg by mouth daily. Take with or immediately following a meal.    . mycophenolate (MYFORTIC) 180 MG EC tablet Take 180 mg by mouth 2 (two) times daily.    . pantoprazole (PROTONIX) 40 MG tablet Take 40 mg by  mouth daily.    Marland Kitchen PARoxetine (PAXIL) 20 MG tablet Take 20 mg by mouth daily.    . Rivaroxaban 15 & 20 MG TBPK Take as directed on package: Start with one 15mg  tablet by mouth twice a day with food. On Day 22, switch to one 20mg  tablet once a day with food. 51 each 0  . tacrolimus (PROGRAF) 0.5 MG capsule Take 0.5 mg by mouth 2 (two) times daily.    Marland Kitchen HYDROcodone-acetaminophen (NORCO/VICODIN) 5-325 MG tablet 1-2 tabs po q 8 hours prn (Patient not taking: Reported on 02/28/2017) 10 tablet 0  . oxyCODONE (ROXICODONE) 5 MG immediate release tablet Take 1 tablet (5 mg total) by mouth every 8 (eight) hours as needed. (Patient not taking: Reported on 02/28/2017) 20 tablet 0   No current facility-administered medications for this visit.       REVIEW OF SYSTEMS (Negative unless checked)  Constitutional: [] Weight loss   [] Fever  [] Chills Cardiac: [] Chest pain   [] Chest pressure   [] Palpitations   [] Shortness of breath when laying flat   [] Shortness of breath at rest   [] Shortness of breath with exertion. Vascular:  [] Pain in legs with walking   [] Pain in legs at rest   [] Pain in legs when laying flat   [] Claudication   [] Pain in feet when walking  [] Pain in feet at rest  [] Pain in feet when laying flat   [x] History of DVT   [] Phlebitis   [x] Swelling in legs   [] Varicose veins   [] Non-healing ulcers Pulmonary:   [] Uses home oxygen   [] Productive cough   [] Hemoptysis   [] Wheeze  [] COPD   [] Asthma Neurologic:  [] Dizziness  [] Blackouts   [] Seizures   [] History of stroke   [] History of TIA  [] Aphasia   [] Temporary blindness   [] Dysphagia   [] Weakness or numbness in arms   [] Weakness or numbness in legs Musculoskeletal:  [x] Arthritis   [] Joint swelling   [x] Joint pain   [] Low back pain Hematologic:  [] Easy bruising  [] Easy bleeding   [] Hypercoagulable state   [] Anemic  [] Hepatitis Gastrointestinal:  [] Blood in stool   [] Vomiting blood  [] Gastroesophageal reflux/heartburn   [] Abdominal pain Genitourinary:  [x] Chronic kidney disease   [] Difficult urination  [] Frequent urination  [] Burning with urination   [] Hematuria Skin:  [] Rashes   [] Ulcers   [] Wounds Psychological:  [] History of anxiety   []  History of major depression.    Physical Exam BP (!) 147/85 (BP Location: Right Arm)   Pulse 73   Resp 17   Ht 6' (1.829 m)   Wt 126.1 kg (278 lb)   BMI 37.70 kg/m  Gen:  WD/WN, NAD Head: /AT, No temporalis wasting.  Ear/Nose/Throat: Hearing grossly intact, nares w/o erythema or drainage, oropharynx w/o Erythema/Exudate Eyes: Conjunctiva clear, sclera non-icteric  Neck: trachea midline.  No JVD.  Pulmonary:  Good air movement, respirations not labored, no use of accessory muscles Cardiac: RRR Vascular:  Vessel Right Left  Radial Palpable Palpable                          PT  1+ palpable 1+ palpable  DP  1+  palpable Palpable   Gastrointestinal: soft, non-tender/non-distended.   Musculoskeletal: M/S 5/5 throughout.  Extremities without ischemic changes.  No deformity or atrophy.  Trace left lower extremity edema.  Diffuse varicosities on the left leg Neurologic: Sensation grossly intact in extremities.  Symmetrical.  Speech is fluent. Motor exam as listed above. Psychiatric: Judgment  intact, Mood & affect appropriate for pt's clinical situation. Dermatologic: No rashes or ulcers noted.  No cellulitis or open wounds. * Radiology US Venous Img Lower Unilateral Left  Result Date: 02/10/2017 CLINICAL DATA:  Left leg swelling for 2 weeks EXAM: LEFT LOWER EXTREMITY VENOUS DUPLEX ULTRASOUND TECHNIQUE: Doppler venous assessment of the left lower extremity deep venous system was performed, including characterization of spectral flow, compressibility, and phasicity. COMPARISON:  None. FINDINGS: There is complete compressibility of the left common femoral, proximal femoral, and mid femoral vein. Doppler analysis demonstrates phasicity. Augmentation was deferred. The distal femoral vein and proximal popliteal vein are partially compressible due to partially occlusive thrombus. The thrombus is of mixed density suggesting subacute age. There is also occlusive thrombus in the distal popliteal vein, peroneal vein, and posterior tibial vein. IMPRESSION: The study is positive for DVT in the left lower extremity. It is partially occlusive in the distal femoral and proximal popliteal vein. DVT is occlusive in the distal popliteal and calf veins. Electronically Signed   By: Marybelle Killings M.D.   On: 02/10/2017 16:46    Labs No results found for this or any previous visit (from the past 2160 hour(s)).  Assessment/Plan:  Hypertension blood pressure control important in reducing the progression of atherosclerotic disease. On appropriate oral medications.   Renal disorder Status post renal transplant about 9 years ago.   This is working well.  No current issues and his renal function remains stable.  On multiple medicines for rejection which could potentially increase the risk of DVT although that is not universal.  Will need to continue these medicines.  DVT (deep venous thrombosis) ([place]) The patient had a popliteal and distal femoral DVT diagnosed about 3 weeks ago.  He has responded well to therapy and has not had bleeding on Xarelto.  We had a long discussion about the natural history and pathophysiology of DVT.  He will need 6 months of anticoagulation for his left lower extremity DVT.  So far, he has tolerated anticoagulation well.  He should increase his activity as tolerated and wear compression stockings as tolerated.  No repeat ultrasound will be performed unless his symptoms worsen.  I will plan on seeing him back in 5-6 months to discuss cessation of anticoagulation at that time.      Leotis Pain 02/28/2017, 9:21 AM   This note was created with Dragon medical transcription system.  Any errors from dictation are unintentional.

## 2017-02-28 NOTE — Assessment & Plan Note (Signed)
Status post renal transplant about 9 years ago.  This is working well.  No current issues and his renal function remains stable.  On multiple medicines for rejection which could potentially increase the risk of DVT although that is not universal.  Will need to continue these medicines.

## 2017-03-03 ENCOUNTER — Other Ambulatory Visit: Payer: Self-pay | Admitting: Nurse Practitioner

## 2017-03-03 DIAGNOSIS — G44309 Post-traumatic headache, unspecified, not intractable: Secondary | ICD-10-CM

## 2017-03-11 ENCOUNTER — Ambulatory Visit
Admission: RE | Admit: 2017-03-11 | Discharge: 2017-03-11 | Disposition: A | Discharge status: Home / Self Care | Payer: BLUE CROSS/BLUE SHIELD | Source: Ambulatory Visit | Attending: Nurse Practitioner | Admitting: Nurse Practitioner

## 2017-03-11 DIAGNOSIS — G44309 Post-traumatic headache, unspecified, not intractable: Secondary | ICD-10-CM

## 2017-05-30 DIAGNOSIS — M1712 Unilateral primary osteoarthritis, left knee: Secondary | ICD-10-CM | POA: Insufficient documentation

## 2017-06-02 ENCOUNTER — Emergency Department
Admission: EM | Admit: 2017-06-02 | Discharge: 2017-06-02 | Disposition: A | Discharge status: Home / Self Care | Payer: BLUE CROSS/BLUE SHIELD | Attending: Emergency Medicine | Admitting: Emergency Medicine

## 2017-06-02 ENCOUNTER — Encounter: Payer: Self-pay | Admitting: Emergency Medicine

## 2017-06-02 ENCOUNTER — Emergency Department: Payer: BLUE CROSS/BLUE SHIELD

## 2017-06-02 ENCOUNTER — Other Ambulatory Visit: Payer: Self-pay

## 2017-06-02 DIAGNOSIS — Z79899 Other long term (current) drug therapy: Secondary | ICD-10-CM | POA: Diagnosis not present

## 2017-06-02 DIAGNOSIS — I10 Essential (primary) hypertension: Secondary | ICD-10-CM | POA: Diagnosis not present

## 2017-06-02 DIAGNOSIS — Z940 Kidney transplant status: Secondary | ICD-10-CM | POA: Insufficient documentation

## 2017-06-02 DIAGNOSIS — I82402 Acute embolism and thrombosis of unspecified deep veins of left lower extremity: Secondary | ICD-10-CM

## 2017-06-02 DIAGNOSIS — F17210 Nicotine dependence, cigarettes, uncomplicated: Secondary | ICD-10-CM | POA: Diagnosis not present

## 2017-06-02 DIAGNOSIS — Z7982 Long term (current) use of aspirin: Secondary | ICD-10-CM | POA: Insufficient documentation

## 2017-06-02 DIAGNOSIS — M79605 Pain in left leg: Secondary | ICD-10-CM | POA: Diagnosis present

## 2017-06-02 MED ORDER — OXYCODONE-ACETAMINOPHEN 5-325 MG PO TABS: 1.0000 | ORAL_TABLET | ORAL | 0 refills | Status: DC | PRN

## 2017-06-02 MED ORDER — RIVAROXABAN 20 MG PO TABS
20.0000 mg | ORAL_TABLET | Freq: Once | ORAL | Status: DC
  Filled 2017-06-02: qty 1

## 2017-06-02 MED ORDER — RIVAROXABAN 20 MG PO TABS: 20.0000 mg | ORAL_TABLET | Freq: Every day | ORAL | 2 refills | Status: DC

## 2017-06-02 MED ORDER — OXYCODONE-ACETAMINOPHEN 5-325 MG PO TABS
1.0000 | ORAL_TABLET | Freq: Once | ORAL | Status: AC
  Administered 2017-06-02: 1 via ORAL
  Filled 2017-06-02: qty 1

## 2017-06-02 NOTE — ED Triage Notes (Signed)
States was seen here for DVT L lower leg 6 weeks ago. Was put on Xaralto. States nephrologist took him off Xaralto 2 weeks ago States today noticed L lower leg more swollen.

## 2017-06-02 NOTE — ED Provider Notes (Signed)
[facility] Emergency Department Provider Note  ____________________________________________  Time seen: Approximately 5:30 PM  I have reviewed the triage vital signs and the nursing notes.   HISTORY  Chief Complaint Leg Swelling   HPI Billy Aldredge is a 52 y.o. male the history of renal transplant and unprovoked DVT who presents for evaluation of left lower extremity pain and swelling.  Patient was diagnosed with an acute DVT in January.  He was started on Xarelto and had significant improvement of his symptoms.  He was supposed to be on Xarelto for 6 months.  According to the patient 2 to 3 weeks ago his nephrologist told him to stop taking the Xarelto.  Over the last week patient has had progressively worsening pain and swelling of his left lower extremity, the pain is currently 8 out of 10, he describes the quality as tightness and throbbing.  He denies chest pain or shortness of breath.  Patient denies any recent travel immobilization, hemoptysis, recent surgery, family history of DVT/PE.  He has no history of cancer.  Past Medical History:  Diagnosis Date  . Diverticulitis   . GERD (gastroesophageal reflux disease)   . Hypertension   . Renal disorder     Patient Active Problem List   Diagnosis Date Noted  . Hypertension 02/28/2017  . Renal disorder 02/28/2017  . DVT (deep venous thrombosis) ([place]) 02/28/2017    Past Surgical History:  Procedure Laterality Date  . COLON RESECTION    . DIALYSIS FISTULA CREATION    . KIDNEY TRANSPLANT    . NASAL RECONSTRUCTION      Prior to Admission medications   Medication Sig Start Date End Date Taking? Authorizing Provider  albuterol (PROVENTIL HFA;VENTOLIN HFA) 108 (90 Base) MCG/ACT inhaler Inhale 1-2 puffs into the lungs every 6 (six) hours as needed for wheezing or shortness of breath. 03/07/15   Lorin Picket, PA-C  amLODipine (NORVASC) 10 MG tablet Take 10 mg by mouth 2 (two) times daily.     [provider]  amoxicillin-clavulanate (AUGMENTIN) 875-125 MG tablet Take 1 tablet by mouth every 12 (twelve) hours. 02/28/15   Lorin Picket, PA-C  aspirin 81 MG tablet Take 81 mg by mouth daily.    [provider]  atorvastatin (LIPITOR) 20 MG tablet Take 20 mg by mouth daily.    [provider]  cyclobenzaprine (FLEXERIL) 10 MG tablet Take 1 tablet (10 mg total) by mouth 3 (three) times daily as needed for muscle spasms. 11/21/14   Melynda Ripple, MD  fluticasone (FLONASE) 50 MCG/ACT nasal spray Place 2 sprays into both nostrils daily. 02/28/15   Lorin Picket, PA-C  HYDROcodone-acetaminophen (NORCO/VICODIN) 5-325 MG tablet 1-2 tabs po q 8 hours prn Patient not taking: Reported on 02/28/2017 03/05/16   Norval Gable, MD  levofloxacin (LEVAQUIN) 500 MG tablet Take 1 tablet (500 mg total) by mouth daily. 03/07/15   Lorin Picket, PA-C  magnesium gluconate (MAGONATE) 500 MG tablet Take 500 mg by mouth 2 (two) times daily.    [provider]  metoprolol succinate (TOPROL-XL) 100 MG 24 hr tablet Take 37.5 mg by mouth daily. Take with or immediately following a meal.    [provider]  mycophenolate (MYFORTIC) 180 MG EC tablet Take 180 mg by mouth 2 (two) times daily.    [provider]  oxyCODONE (ROXICODONE) 5 MG immediate release tablet Take 1 tablet (5 mg total) by mouth every 8 (eight) hours as needed. Patient not  taking: Reported on 02/28/2017 02/10/17 02/10/18  Victorino Dike, FNP  oxyCODONE-acetaminophen (PERCOCET) 5-325 MG tablet Take 1 tablet by mouth every 4 (four) hours as needed for severe pain. 06/02/17   Alfred Levins, [place], MD  pantoprazole (PROTONIX) 40 MG tablet Take 40 mg by mouth daily.    [provider]  PARoxetine (PAXIL) 20 MG tablet Take 20 mg by mouth daily.    [provider]  rivaroxaban (XARELTO) 20 MG TABS tablet Take 1 tablet (20 mg total) by mouth daily with supper. 06/02/17   Rudene Re, MD  tacrolimus (PROGRAF) 0.5 MG capsule Take 0.5 mg by mouth 2 (two) times daily.    [provider]    Allergies Patient has no known allergies.  Family History  Problem Relation Age of Onset  . Cancer Mother   . Cancer Father     Social History Social History   Tobacco Use  . Smoking status: Current Every Day Smoker    Packs/day: 1.00    Types: Cigarettes  . Smokeless tobacco: Never Used  Substance Use Topics  . Alcohol use: Yes    Comment: social  . Drug use: No    Review of Systems  Constitutional: Negative for fever. Eyes: Negative for visual changes. ENT: Negative for sore throat. Neck: No neck pain  Cardiovascular: Negative for chest pain. Respiratory: Negative for shortness of breath. Gastrointestinal: Negative for abdominal pain, vomiting or diarrhea. Genitourinary: Negative for dysuria. Musculoskeletal: Negative for back pain. + LLE swelling and pain Skin: Negative for rash. Neurological: Negative for headaches, weakness or numbness. Psych: No SI or HI  ____________________________________________   PHYSICAL EXAM:  VITAL SIGNS: ED Triage Vitals  Enc Vitals Group     BP 06/02/17 1503 (!) 147/64     Pulse Rate 06/02/17 1503 70     Resp 06/02/17 1503 20     Temp 06/02/17 1503 97.9 F (36.6 C)     Temp Source 06/02/17 1503 Oral     SpO2 06/02/17 1503 95 %     Weight 06/02/17 1504 275 lb (124.7 kg)     Height 06/02/17 1504 6' (1.829 m)     Head Circumference --      Peak Flow --      Pain Score 06/02/17 1503 6     Pain Loc --      Pain Edu? --      Excl. in [place]? --     Constitutional: Alert and oriented. Well appearing and in no apparent distress. HEENT:      Head: Normocephalic and atraumatic.         Eyes: Conjunctivae are normal. Sclera is non-icteric.       Mouth/Throat: Mucous membranes are moist.       Neck: Supple with no signs of meningismus. Cardiovascular: Regular rate and rhythm. No murmurs, gallops, or rubs.  2+ symmetrical distal pulses are present in all extremities. No JVD. Respiratory: Normal respiratory effort. Lungs are clear to auscultation bilaterally. No wheezes, crackles, or rhonchi.  Gastrointestinal: Soft, non tender, and non distended with positive bowel sounds. No rebound or guarding. Musculoskeletal: Asymmetric swelling of the left lower extremity with trace pitting edema, normal distal pulses.   Neurologic: Normal speech and language. Face is symmetric. Moving all extremities. No gross focal neurologic deficits are appreciated. Skin: Skin is warm, dry and intact. No rash noted. Psychiatric: Mood and affect are normal. Speech and behavior are normal.  ____________________________________________   LABS (all labs ordered are listed,  but only abnormal results are displayed)  Labs Reviewed - No data to display ____________________________________________  EKG  none  ____________________________________________  RADIOLOGY  I have personally reviewed the images performed during this visit and I agree with the Radiologist's read.   Interpretation by Radiologist:  US Venous Img Lower Unilateral Left  Result Date: 06/02/2017 CLINICAL DATA:  52 year old male with left lower extremity edema EXAM: LEFT LOWER EXTREMITY VENOUS DOPPLER ULTRASOUND TECHNIQUE: Gray-scale sonography with graded compression, as well as color Doppler and duplex ultrasound were performed to evaluate the lower extremity deep venous systems from the level of the common femoral vein and including the common femoral, femoral, profunda femoral, popliteal and calf veins including the posterior tibial, peroneal and gastrocnemius veins when visible. The superficial great saphenous vein was also interrogated. Spectral Doppler was utilized to evaluate flow at rest and with distal augmentation maneuvers in the common femoral, femoral and popliteal veins. COMPARISON:  None. FINDINGS: Contralateral Common Femoral Vein:  Respiratory phasicity is normal and symmetric with the symptomatic side. No evidence of thrombus. Normal compressibility. Common Femoral Vein: No evidence of thrombus. Normal compressibility, respiratory phasicity and response to augmentation. Saphenofemoral Junction: No evidence of thrombus. Normal compressibility and flow on color Doppler imaging. Profunda Femoral Vein: No evidence of thrombus. Normal compressibility and flow on color Doppler imaging. Femoral Vein: The vessel is patent and compressible in the proximal thigh. However, beginning in the mid thigh, the vessel becomes expanded, noncompressible in the lumen is filled with low-level internal echoes. No evidence of color flow on color Doppler imaging consistent with acute thrombosis. Thrombus extends through the vessel in the distal thigh and into the popliteal vein. Popliteal Vein: Occlusive thrombus extends throughout the popliteal vein. Calf Veins: Occlusive thrombus extends into the posterior tibial and peroneal veins. Superficial Great Saphenous Vein: No evidence of thrombus. Normal compressibility. Venous Reflux:  None. Other Findings:  None. IMPRESSION: Positive for acute occlusive DVT in the femoral vein in the mid and distal thigh, the popliteal vein and the visualized calf veins. Electronically Signed   By: Jacqulynn Cadet M.D.   On: 06/02/2017 16:22     ____________________________________________   PROCEDURES  Procedure(s) performed: None Procedures Critical Care performed:  None ____________________________________________   INITIAL IMPRESSION / ASSESSMENT AND PLAN / ED COURSE  52 y.o. male the history of renal transplant and unprovoked DVT who presents for evaluation of left lower extremity pain and swelling x 1 week after stopping Xarelto early 3 weeks ago.  Review of epic shows patient's nephrologist note recommended 6 months of Xarelto and follow-up with the patient's vascular surgeon Dr. Lucky Cowboy.  However patient tells me  that his nephrologist took him out of Xarelto 3 weeks ago.  Possibly miscommunication error.  Patient has new occlusive DVT of the left lower extremity.  He has no signs or symptoms of pulmonary embolism.  He has no tachycardia, no tachypnea, no hypoxia, denies chest pain or shortness of breath.  I discussed with Dr. Lucky Cowboy patient's presentation, ultrasound findings, and history.  He recommended restarting patient on Xarelto which he still has at home and close follow-up in the office in a week.  I discussed signs and symptoms of pulmonary embolism with patient and recommended emergent evaluation if these develop.  Patient is refusing Xarelto here because he has it at home and wishes to take his own at home.  Patient received 1 dose of Percocet for pain.  He will receive a short course of Percocet for home.  As part of my medical decision making, I reviewed the following data within the [place] notes reviewed and incorporated, Old chart reviewed, Radiograph reviewed , A consult was requested and obtained from this/these consultant(s) Vascular Surgery, Notes from prior ED visits and [place] Controlled Substance Database    Pertinent labs & imaging results that were available during my care of the patient were reviewed by me and considered in my medical decision making (see chart for details).    ____________________________________________   FINAL CLINICAL IMPRESSION(S) / ED DIAGNOSES  Final diagnoses:  Acute deep vein thrombosis (DVT) of left lower extremity, unspecified vein (HCC)      NEW MEDICATIONS STARTED DURING THIS VISIT:  ED Discharge Orders        Ordered    rivaroxaban (XARELTO) 20 MG TABS tablet  Daily with supper     06/02/17 1733    oxyCODONE-acetaminophen (PERCOCET) 5-325 MG tablet  Every 4 hours PRN     06/02/17 1734       Note:  This document was prepared using Dragon voice recognition software and may include unintentional dictation  errors.    Alfred Levins, [place], MD 06/02/17 1739

## 2017-06-02 NOTE — Discharge Instructions (Signed)
Follow-up with Dr. Lucky Cowboy in 1 week.  Resume your Xarelto 20 mg daily.  Return to the emergency room if you develop chest pain or shortness of breath.

## 2017-06-04 ENCOUNTER — Other Ambulatory Visit: Payer: Self-pay | Admitting: Unknown Physician Specialty

## 2017-06-04 DIAGNOSIS — M1712 Unilateral primary osteoarthritis, left knee: Secondary | ICD-10-CM

## 2017-06-10 ENCOUNTER — Ambulatory Visit (INDEPENDENT_AMBULATORY_CARE_PROVIDER_SITE_OTHER): Payer: 59 | Admitting: Vascular Surgery

## 2017-06-10 ENCOUNTER — Encounter (INDEPENDENT_AMBULATORY_CARE_PROVIDER_SITE_OTHER): Payer: Self-pay | Admitting: Vascular Surgery

## 2017-06-10 VITALS — BP 131/73 | HR 69 | Resp 17 | Ht 72.0 in | Wt 284.0 lb

## 2017-06-10 DIAGNOSIS — I82432 Acute embolism and thrombosis of left popliteal vein: Secondary | ICD-10-CM | POA: Diagnosis not present

## 2017-06-10 DIAGNOSIS — I10 Essential (primary) hypertension: Secondary | ICD-10-CM | POA: Diagnosis not present

## 2017-06-10 NOTE — Progress Notes (Signed)
MRN : [id number]  Billy Hughes is a 52 y.o. (Oct 09, 1965) male who presents with chief complaint of  Chief Complaint  Patient presents with  . Follow-up  .  History of Present Illness: Patient returns today in follow up of DVT.  I saw him about 3 months ago when he was started on Xarelto.  He had a left lower extremity DVT and his symptoms significantly improved with the initiation of Xarelto.  About a week ago, he went to the emergency department for worsening pain and swelling in the left leg.  He was found to have more extensive left lower extremity DVT at that time.  It was not entirely clear to me why his Xarelto had been stopped but he had not been taking it for a week or so prior to his ER visit.  He was restarted on Xarelto and his left lower extremity pain and swelling have noticeably improved.  They are not resolved.  He has no chest pain or shortness of breath.  There is some purplish discoloration of his left lower leg as well.  Current Outpatient Medications  Medication Sig Dispense Refill  . albuterol (PROVENTIL HFA;VENTOLIN HFA) 108 (90 Base) MCG/ACT inhaler Inhale 1-2 puffs into the lungs every 6 (six) hours as needed for wheezing or shortness of breath. 1 Inhaler 0  . amLODipine (NORVASC) 10 MG tablet Take 10 mg by mouth 2 (two) times daily.    Marland Kitchen aspirin 81 MG tablet Take 81 mg by mouth daily.    Marland Kitchen atorvastatin (LIPITOR) 20 MG tablet Take 20 mg by mouth daily.    . cyclobenzaprine (FLEXERIL) 10 MG tablet Take 1 tablet (10 mg total) by mouth 3 (three) times daily as needed for muscle spasms. 20 tablet 0  . fluticasone (FLONASE) 50 MCG/ACT nasal spray Place 2 sprays into both nostrils daily. 16 g 0  . levofloxacin (LEVAQUIN) 500 MG tablet Take 1 tablet (500 mg total) by mouth daily. 10 tablet 0  . magnesium gluconate (MAGONATE) 500 MG tablet Take 500 mg by mouth 2 (two) times daily.    . metoprolol succinate (TOPROL-XL) 100 MG 24 hr tablet Take 37.5 mg by mouth daily.  Take with or immediately following a meal.    . mycophenolate (MYFORTIC) 180 MG EC tablet Take 180 mg by mouth 2 (two) times daily.    Marland Kitchen oxyCODONE-acetaminophen (PERCOCET) 5-325 MG tablet Take 1 tablet by mouth every 4 (four) hours as needed for severe pain. 20 tablet 0  . pantoprazole (PROTONIX) 40 MG tablet Take 40 mg by mouth daily.    Marland Kitchen PARoxetine (PAXIL) 20 MG tablet Take 20 mg by mouth daily.    . rivaroxaban (XARELTO) 20 MG TABS tablet Take 1 tablet (20 mg total) by mouth daily with supper. 30 tablet 2  . tacrolimus (PROGRAF) 0.5 MG capsule Take 0.5 mg by mouth 2 (two) times daily.    Marland Kitchen amoxicillin-clavulanate (AUGMENTIN) 875-125 MG tablet Take 1 tablet by mouth every 12 (twelve) hours. (Patient not taking: Reported on 06/10/2017) 20 tablet 0  . HYDROcodone-acetaminophen (NORCO/VICODIN) 5-325 MG tablet 1-2 tabs po q 8 hours prn (Patient not taking: Reported on 02/28/2017) 10 tablet 0  . oxyCODONE (ROXICODONE) 5 MG immediate release tablet Take 1 tablet (5 mg total) by mouth every 8 (eight) hours as needed. (Patient not taking: Reported on 06/10/2017) 20 tablet 0   No current facility-administered medications for this visit.     Past Medical History:  Diagnosis Date  .  Diverticulitis   . GERD (gastroesophageal reflux disease)   . Hypertension   . Renal disorder     Past Surgical History:  Procedure Laterality Date  . COLON RESECTION    . DIALYSIS FISTULA CREATION    . KIDNEY TRANSPLANT    . NASAL RECONSTRUCTION      Social History Social History   Tobacco Use  . Smoking status: Current Every Day Smoker    Packs/day: 1.00    Types: Cigarettes  . Smokeless tobacco: Never Used  Substance Use Topics  . Alcohol use: Yes    Comment: social  . Drug use: No      Family History Family History  Problem Relation Age of Onset  . Cancer Mother   . Cancer Father      No Known Allergies   REVIEW OF SYSTEMS (Negative unless checked)  Constitutional: [] Weight loss  [] Fever   [] Chills Cardiac: [] Chest pain   [] Chest pressure   [] Palpitations   [] Shortness of breath when laying flat   [] Shortness of breath at rest   [] Shortness of breath with exertion. Vascular:  [] Pain in legs with walking   [] Pain in legs at rest   [] Pain in legs when laying flat   [] Claudication   [] Pain in feet when walking  [] Pain in feet at rest  [] Pain in feet when laying flat   [] History of DVT   [] Phlebitis   [] Swelling in legs   [] Varicose veins   [] Non-healing ulcers Pulmonary:   [] Uses home oxygen   [] Productive cough   [] Hemoptysis   [] Wheeze  [] COPD   [] Asthma Neurologic:  [] Dizziness  [] Blackouts   [] Seizures   [] History of stroke   [] History of TIA  [] Aphasia   [] Temporary blindness   [] Dysphagia   [] Weakness or numbness in arms   [] Weakness or numbness in legs Musculoskeletal:  [] Arthritis   [] Joint swelling   [] Joint pain   [] Low back pain Hematologic:  [] Easy bruising  [] Easy bleeding   [] Hypercoagulable state   [] Anemic   Gastrointestinal:  [] Blood in stool   [] Vomiting blood  [] Gastroesophageal reflux/heartburn   [] Abdominal pain Genitourinary:  [] Chronic kidney disease   [] Difficult urination  [] Frequent urination  [] Burning with urination   [] Hematuria Skin:  [] Rashes   [] Ulcers   [] Wounds Psychological:  [] History of anxiety   []  History of major depression.  Physical Examination  BP 131/73 (BP Location: Right Arm)   Pulse 69   Resp 17   Ht 6' (1.829 m)   Wt 284 lb (128.8 kg)   BMI 38.52 kg/m  Gen:  WD/WN, NAD. Appears older than stated age. Head: [place]/AT, No temporalis wasting. Ear/Nose/Throat: Hearing grossly intact, nares w/o erythema or drainage Eyes: Conjunctiva clear. Sclera non-icteric Neck: Supple.  Trachea midline Pulmonary:  Good air movement, no use of accessory muscles.  Cardiac: RRR, no JVD Vascular:  Vessel Right Left  Radial Palpable Palpable                                    Musculoskeletal: M/S 5/5 throughout.  No deformity or atrophy. 1+  LLE edema. Neurologic: Sensation grossly intact in extremities.  Symmetrical.  Speech is fluent.  Psychiatric: Judgment intact, Mood & affect appropriate for pt's clinical situation. Dermatologic: No rashes or ulcers noted.  No cellulitis or open wounds.       Labs No results found for this or any previous visit (from the  past 2160 hour(s)).  Radiology US Venous Img Lower Unilateral Left  Result Date: 06/02/2017 CLINICAL DATA:  52 year old male with left lower extremity edema EXAM: LEFT LOWER EXTREMITY VENOUS DOPPLER ULTRASOUND TECHNIQUE: Gray-scale sonography with graded compression, as well as color Doppler and duplex ultrasound were performed to evaluate the lower extremity deep venous systems from the level of the common femoral vein and including the common femoral, femoral, profunda femoral, popliteal and calf veins including the posterior tibial, peroneal and gastrocnemius veins when visible. The superficial great saphenous vein was also interrogated. Spectral Doppler was utilized to evaluate flow at rest and with distal augmentation maneuvers in the common femoral, femoral and popliteal veins. COMPARISON:  None. FINDINGS: Contralateral Common Femoral Vein: Respiratory phasicity is normal and symmetric with the symptomatic side. No evidence of thrombus. Normal compressibility. Common Femoral Vein: No evidence of thrombus. Normal compressibility, respiratory phasicity and response to augmentation. Saphenofemoral Junction: No evidence of thrombus. Normal compressibility and flow on color Doppler imaging. Profunda Femoral Vein: No evidence of thrombus. Normal compressibility and flow on color Doppler imaging. Femoral Vein: The vessel is patent and compressible in the proximal thigh. However, beginning in the mid thigh, the vessel becomes expanded, noncompressible in the lumen is filled with low-level internal echoes. No evidence of color flow on color Doppler imaging consistent with acute  thrombosis. Thrombus extends through the vessel in the distal thigh and into the popliteal vein. Popliteal Vein: Occlusive thrombus extends throughout the popliteal vein. Calf Veins: Occlusive thrombus extends into the posterior tibial and peroneal veins. Superficial Great Saphenous Vein: No evidence of thrombus. Normal compressibility. Venous Reflux:  None. Other Findings:  None. IMPRESSION: Positive for acute occlusive DVT in the femoral vein in the mid and distal thigh, the popliteal vein and the visualized calf veins. Electronically Signed   By: Jacqulynn Cadet M.D.   On: 06/02/2017 16:22    Assessment/Plan  Hypertension blood pressure control important in reducing the progression of atherosclerotic disease. On appropriate oral medications.   DVT (deep venous thrombosis) ([place]) The patient has what appears to be either a recurrent DVT or progression of his disease in the left lower extremity.  This occurred while he was off anticoagulation so this is not really a failure of anticoagulation.  His DVT extends to the mid thigh which would not really be a typical candidate for intervention.  He has had some good improvement with restarting his Xarelto so he should continue this.  He would need at least 6 months from today of anticoagulation and potentially need this longer.  I have discussed the pathophysiology and natural history of DVT.  He should get a compression stocking and begin wearing that daily.  He should also increase his activity.  I will see him back in 6 months or sooner if problems develop in the interim.    Leotis Pain, MD  06/10/2017 4:55 PM    This note was created with Dragon medical transcription system.  Any errors from dictation are purely unintentional

## 2017-06-10 NOTE — Patient Instructions (Signed)
Deep Vein Thrombosis Deep vein thrombosis (DVT) is a condition in which a blood clot forms in a deep vein, such as a lower leg, thigh, or arm vein. A clot is blood that has thickened into a gel or solid. This condition is dangerous. It can lead to serious and even life-threatening complications if the clot travels to the lungs and causes a blockage (pulmonary embolism). It can also damage veins in the leg. This can result in leg pain, swelling, discoloration, and sores (post-thrombotic syndrome). What are the causes? This condition may be caused by:  A slowdown of blood flow.  Damage to a vein.  A condition that makes blood clot more easily.  What increases the risk? The following factors may make you more likely to develop this condition:  Being overweight.  Being elderly, especially over age 60.  Sitting or lying down for more than four hours.  Lack of physical activity (sedentary lifestyle).  Being pregnant, giving birth, or having recently given birth.  Taking medicines that contain estrogen.  Smoking.  A history of any of the following: ? Blood clots or blood clotting disease. ? Peripheral vascular disease. ? Inflammatory bowel disease. ? Cancer. ? Heart disease. ? Genetic conditions that affect how blood clots. ? Neurological diseases that affect the legs (leg paresis). ? Injury. ? Major or lengthy surgery. ? A central line placed inside a large vein.  What are the signs or symptoms? Symptoms of this condition include:  Swelling, pain, or tenderness in an arm or leg.  Warmth, redness, or discoloration in an arm or leg.  If the clot is in your leg, symptoms may be more noticeable or worse when you stand or walk. Some people do not have any symptoms. How is this diagnosed? This condition is diagnosed with:  A medical history.  A physical exam.  Tests, such as: ? Blood tests. These are done to see how your blood clots. ? Imaging tests. These are done to  check for clots. Tests may include:  Ultrasound.  CT scan.  MRI.  X-ray.  Venogram. For this test, X-rays are taken after a dye is injected into a vein.  How is this treated? Treatment for this condition depends on the cause, your risk for bleeding or developing more clots, and any medical conditions you have. Treatment may include:  Taking blood thinners (also called anticoagulants). These medicines may be taken by mouth, injected under the skin, or injected through an IV tube (catheter). These medicines prevent clots from forming.  Injecting medicine that dissolves blood clots into the affected vein (catheter-directed thrombolysis).  Having surgery. Surgery may be done to: ? Remove the clot. ? Place a filter in a large vein to catch blood clots before they reach the lungs.  Some treatments may be continued for up to six months. Follow these instructions at home: If you are taking an oral blood thinner:  Take the medicine exactly as told by your health care provider. Some blood thinners need to be taken at the same time every day. Do not skip a dose.  Ask your health care provider about what foods and drugs interact with the medicine.  Ask about possible side effects. General instructions  Blood thinners can cause easy bruising and difficulty stopping bleeding. Because of this, if you are taking or were given a blood thinner: ? Hold pressure over cuts for longer than usual. ? Tell your dentist and other health care providers that you are taking blood thinners before   having any procedures that can cause bleeding. ? Avoid contact sports.  Take over-the-counter and prescription medicines only as told by your health care provider.  Return to your normal activities as told by your health care provider. Ask your health care provider what activities are safe for you.  Wear compression stockings if recommended by your health care provider.  Keep all follow-up visits as told by  your health care provider. This is important. How is this prevented? To lower your risk of developing this condition again:  For 30 or more minutes every day, do an activity that: ? Involves moving your arms and legs. ? Increases your heart rate.  When traveling for longer than four hours: ? Exercise your arms and legs every hour. ? Drink plenty of water. ? Avoid drinking alcohol.  Avoid sitting or lying for a long time without moving your legs.  Stay a healthy weight.  If you are a woman who is older than age 35, avoid unnecessary use of medicines that contain estrogen.  Do not use any products that contain nicotine or tobacco, such as cigarettes and e-cigarettes. This is especially important if you take estrogen medicines. If you need help quitting, ask your health care provider.  Contact a health care provider if:  You miss a dose of your blood thinner.  You have nausea, vomiting, or diarrhea that lasts for more than one day.  Your menstrual period is heavier than usual.  You have unusual bruising. Get help right away if:  You have new or increased pain, swelling, or redness in an arm or leg.  You have numbness or tingling in an arm or leg.  You have shortness of breath.  You have chest pain.  You have a rapid or irregular heartbeat.  You feel light-headed or dizzy.  You cough up blood.  There is blood in your vomit, stool, or urine.  You have a serious fall or accident, or you hit your head.  You have a severe headache or confusion.  You have a cut that will not stop bleeding. These symptoms may represent a serious problem that is an emergency. Do not wait to see if the symptoms will go away. Get medical help right away. Call your local emergency services (911 in the [place] Do not drive yourself to the hospital. Summary  DVT is a condition in which a blood clot forms in a deep vein, such as a lower leg, thigh, or arm vein.  Symptoms can include swelling,  warmth, pain, and redness in your leg or arm.  Treatment may include taking blood thinners, injecting medicine that dissolves blood clots,wearing compression stockings, or surgery.  If you are prescribed blood thinners, take them exactly as told. This information is not intended to replace advice given to you by your health care provider. Make sure you discuss any questions you have with your health care provider. Document Released: 12/31/2004 Document Revised: 02/03/2016 Document Reviewed: 02/03/2016 Elsevier Interactive Patient Education  2018 [employer]  

## 2017-06-10 NOTE — Assessment & Plan Note (Signed)
The patient has what appears to be either a recurrent DVT or progression of his disease in the left lower extremity.  This occurred while he was off anticoagulation so this is not really a failure of anticoagulation.  His DVT extends to the mid thigh which would not really be a typical candidate for intervention.  He has had some good improvement with restarting his Xarelto so he should continue this.  He would need at least 6 months from today of anticoagulation and potentially need this longer.  I have discussed the pathophysiology and natural history of DVT.  He should get a compression stocking and begin wearing that daily.  He should also increase his activity.  I will see him back in 6 months or sooner if problems develop in the interim.

## 2017-06-10 NOTE — Assessment & Plan Note (Signed)
blood pressure control important in reducing the progression of atherosclerotic disease. On appropriate oral medications.  

## 2017-06-26 ENCOUNTER — Telehealth (INDEPENDENT_AMBULATORY_CARE_PROVIDER_SITE_OTHER): Payer: Self-pay | Admitting: Vascular Surgery

## 2017-06-26 NOTE — Telephone Encounter (Signed)
Then [facility] will need to fax over a vascular clearance for Dr. Lucky Cowboy to sign.

## 2017-06-26 NOTE — Telephone Encounter (Signed)
patient said dr . Jefm Bryant wants dr dew approval to do a knee replacement. Patient can be reached at number in system.

## 2017-08-11 ENCOUNTER — Encounter: Payer: Self-pay | Admitting: General Surgery

## 2017-08-20 ENCOUNTER — Telehealth (INDEPENDENT_AMBULATORY_CARE_PROVIDER_SITE_OTHER): Payer: Self-pay

## 2017-08-21 NOTE — Telephone Encounter (Signed)
Patient is going to be schedule for procedure

## 2017-08-29 ENCOUNTER — Ambulatory Visit (INDEPENDENT_AMBULATORY_CARE_PROVIDER_SITE_OTHER): Payer: 59 | Admitting: Vascular Surgery

## 2017-09-03 ENCOUNTER — Encounter (INDEPENDENT_AMBULATORY_CARE_PROVIDER_SITE_OTHER): Payer: Self-pay

## 2017-09-05 ENCOUNTER — Other Ambulatory Visit (INDEPENDENT_AMBULATORY_CARE_PROVIDER_SITE_OTHER): Payer: Self-pay | Admitting: Nurse Practitioner

## 2017-09-18 ENCOUNTER — Other Ambulatory Visit: Payer: Self-pay | Admitting: Physical Medicine and Rehabilitation

## 2017-09-18 DIAGNOSIS — M5412 Radiculopathy, cervical region: Secondary | ICD-10-CM

## 2017-09-23 ENCOUNTER — Ambulatory Visit
Admission: RE | Admit: 2017-09-23 | Discharge: 2017-09-23 | Disposition: A | Discharge status: Home / Self Care | Payer: BLUE CROSS/BLUE SHIELD | Source: Ambulatory Visit | Attending: Physical Medicine and Rehabilitation | Admitting: Physical Medicine and Rehabilitation

## 2017-09-23 DIAGNOSIS — M5412 Radiculopathy, cervical region: Secondary | ICD-10-CM

## 2017-09-23 DIAGNOSIS — M50122 Cervical disc disorder at C5-C6 level with radiculopathy: Secondary | ICD-10-CM | POA: Diagnosis not present

## 2017-09-30 ENCOUNTER — Emergency Department
Admission: EM | Admit: 2017-09-30 | Discharge: 2017-09-30 | Disposition: A | Discharge status: Home / Self Care | Payer: BLUE CROSS/BLUE SHIELD | Attending: Emergency Medicine | Admitting: Emergency Medicine

## 2017-09-30 ENCOUNTER — Other Ambulatory Visit: Payer: Self-pay

## 2017-09-30 ENCOUNTER — Emergency Department: Payer: BLUE CROSS/BLUE SHIELD

## 2017-09-30 DIAGNOSIS — R079 Chest pain, unspecified: Secondary | ICD-10-CM | POA: Insufficient documentation

## 2017-09-30 DIAGNOSIS — R1013 Epigastric pain: Secondary | ICD-10-CM | POA: Diagnosis present

## 2017-09-30 DIAGNOSIS — Z7901 Long term (current) use of anticoagulants: Secondary | ICD-10-CM | POA: Diagnosis not present

## 2017-09-30 DIAGNOSIS — Z79899 Other long term (current) drug therapy: Secondary | ICD-10-CM | POA: Diagnosis not present

## 2017-09-30 DIAGNOSIS — Z7982 Long term (current) use of aspirin: Secondary | ICD-10-CM | POA: Diagnosis not present

## 2017-09-30 DIAGNOSIS — I10 Essential (primary) hypertension: Secondary | ICD-10-CM | POA: Insufficient documentation

## 2017-09-30 DIAGNOSIS — F17210 Nicotine dependence, cigarettes, uncomplicated: Secondary | ICD-10-CM | POA: Insufficient documentation

## 2017-09-30 DIAGNOSIS — Z86718 Personal history of other venous thrombosis and embolism: Secondary | ICD-10-CM | POA: Insufficient documentation

## 2017-09-30 LAB — BASIC METABOLIC PANEL
ANION GAP: 10 (ref 5–15)
BUN: 20 mg/dL (ref 6–20)
CHLORIDE: 99 mmol/L (ref 98–111)
CO2: 25 mmol/L (ref 22–32)
Calcium: 10 mg/dL (ref 8.9–10.3)
Creatinine, Ser: 1.81 mg/dL — ABNORMAL HIGH (ref 0.61–1.24)
GFR calc Af Amer: 48 mL/min — ABNORMAL LOW (ref >60)
GFR calc non Af Amer: 41 mL/min — ABNORMAL LOW (ref >60)
GLUCOSE: 124 mg/dL — AB (ref 70–99)
POTASSIUM: 4.2 mmol/L (ref 3.5–5.1)
Sodium: 134 mmol/L — ABNORMAL LOW (ref 135–145)

## 2017-09-30 LAB — CBC
HEMATOCRIT: 46.8 % (ref 40.0–52.0)
HEMOGLOBIN: 16.5 g/dL (ref 13.0–18.0)
MCH: 30.3 pg (ref 26.0–34.0)
MCHC: 35.2 g/dL (ref 32.0–36.0)
MCV: 86.1 fL (ref 80.0–100.0)
PLATELETS: 228 10*3/uL (ref 150–440)
RBC: 5.43 MIL/uL (ref 4.40–5.90)
RDW: 14.4 % (ref 11.5–14.5)
WBC: 11.5 10*3/uL — ABNORMAL HIGH (ref 3.8–10.6)

## 2017-09-30 LAB — TROPONIN I: Troponin I: <0.03 ng/mL (ref <0.03)

## 2017-09-30 MED ORDER — GI COCKTAIL ~~LOC~~
30.0000 mL | Freq: Once | ORAL | Status: AC
  Administered 2017-09-30: 30 mL via ORAL
  Filled 2017-09-30: qty 30

## 2017-09-30 MED ORDER — LIDOCAINE VISCOUS HCL 2 % MT SOLN
15.0000 mL | Freq: Once | OROMUCOSAL | Status: AC
  Administered 2017-09-30: 15 mL via OROMUCOSAL
  Filled 2017-09-30: qty 15

## 2017-09-30 MED ORDER — SUCRALFATE 1 G PO TABS: 1.0000 g | ORAL_TABLET | Freq: Four times a day (QID) | ORAL | 1 refills | Status: DC

## 2017-09-30 MED ORDER — PANTOPRAZOLE SODIUM 40 MG PO TBEC: 40.0000 mg | DELAYED_RELEASE_TABLET | Freq: Two times a day (BID) | ORAL | 1 refills | Status: AC

## 2017-09-30 MED ORDER — SODIUM CHLORIDE 0.9 % IV SOLN
Freq: Once | INTRAVENOUS | Status: AC
  Administered 2017-09-30: 14:00:00 via INTRAVENOUS

## 2017-09-30 NOTE — ED Notes (Signed)
Pt states pain is right below sternum but not quite into stomach area. Thought it was heartburn and has taken pepto before arriving.

## 2017-09-30 NOTE — ED Triage Notes (Signed)
Epigastric and upper mid back pain that started Saturday. Nausea. Denies SOB. Pt hx of blood clot, takes Xarelto. Pt alert and oriented X4, active, cooperative, pt in NAD. RR even and unlabored, color WNL.

## 2017-09-30 NOTE — ED Provider Notes (Signed)
[facility] Emergency Department Provider Note       Time seen: ----------------------------------------- 1:13 PM on 09/30/2017 -----------------------------------------   I have reviewed the triage vital signs and the nursing notes.  HISTORY   Chief Complaint Chest Pain    HPI Billy Hughes is a 52 y.o. male with a history of diverticulitis, GERD, hypertension, renal disorder who presents to the ED for epigastric and upper back pain that started Saturday.  Patient has had some nausea but denies any shortness of breath or pleuritic pain.  He does have a history of DVT and is currently taking Xarelto.  DVT was diagnosed 4 months ago.  He denies any recent fevers, chills or other complaints.  Pain is worse with eating.  Past Medical History:  Diagnosis Date  . Diverticulitis   . GERD (gastroesophageal reflux disease)   . Hypertension   . Renal disorder     Patient Active Problem List   Diagnosis Date Noted  . Hypertension 02/28/2017  . Renal disorder 02/28/2017  . DVT (deep venous thrombosis) ([place]) 02/28/2017    Past Surgical History:  Procedure Laterality Date  . COLON RESECTION    . DIALYSIS FISTULA CREATION    . KIDNEY TRANSPLANT    . NASAL RECONSTRUCTION      Allergies Patient has no known allergies.  Social History Social History   Tobacco Use  . Smoking status: Current Every Day Smoker    Packs/day: 1.00    Types: Cigarettes  . Smokeless tobacco: Never Used  Substance Use Topics  . Alcohol use: Yes    Comment: social  . Drug use: No   Review of Systems Constitutional: Negative for fever. Cardiovascular: Negative for chest pain. Respiratory: Negative for shortness of breath. Gastrointestinal: Positive for epigastric pain, nausea Musculoskeletal: Negative for back pain. Skin: Negative for rash. Neurological: Negative for headaches, focal weakness or numbness.  All systems negative/normal/unremarkable except as  stated in the HPI  ____________________________________________   PHYSICAL EXAM:  VITAL SIGNS: ED Triage Vitals  Enc Vitals Group     BP 09/30/17 1120 136/67     Pulse Rate 09/30/17 1120 74     Resp 09/30/17 1120 16     Temp 09/30/17 1120 98.2 F (36.8 C)     Temp Source 09/30/17 1120 Oral     SpO2 09/30/17 1120 95 %     Weight 09/30/17 1121 277 lb (125.6 kg)     Height 09/30/17 1121 6' (1.829 m)     Head Circumference --      Peak Flow --      Pain Score 09/30/17 1121 3     Pain Loc --      Pain Edu? --      Excl. in [place]? --    Constitutional: Alert and oriented. Well appearing and in no distress. Eyes: Conjunctivae are normal. Normal extraocular movements. ENT   Head: Normocephalic and atraumatic.   Nose: No congestion/rhinnorhea.   Mouth/Throat: Mucous membranes are moist.   Neck: No stridor. Cardiovascular: Normal rate, regular rhythm. No murmurs, rubs, or gallops. Respiratory: Normal respiratory effort without tachypnea nor retractions. Breath sounds are clear and equal bilaterally. No wheezes/rales/rhonchi. Gastrointestinal: Epigastric tenderness, no rebound or guarding.  Normal bowel sounds. Musculoskeletal: Nontender with normal range of motion in extremities. No lower extremity tenderness nor edema. Neurologic:  Normal speech and language. No gross focal neurologic deficits are appreciated.  Skin:  Skin is warm, dry and intact. No rash noted. Psychiatric: Mood and  affect are normal. Speech and behavior are normal.  ____________________________________________  EKG: Interpreted by me.  Sinus rhythm rate of 73 bpm, normal PR interval, normal QRS, normal QT  ____________________________________________  ED COURSE:  As part of my medical decision making, I reviewed the following data within the [place] History obtained from family if available, nursing notes, old chart and ekg, as well as notes from prior ED visits. Patient  presented for epigastric discomfort that is worse with swallowing and eating, we will assess with labs and imaging as indicated at this time.   Procedures ____________________________________________   LABS (pertinent positives/negatives)  Labs Reviewed  BASIC METABOLIC PANEL - Abnormal; Notable for the following components:      Result Value   Sodium 134 (*)    Glucose, Bld 124 (*)    Creatinine, Ser 1.81 (*)    GFR calc non Af Amer 41 (*)    GFR calc Af Amer 48 (*)    All other components within normal limits  CBC - Abnormal; Notable for the following components:   WBC 11.5 (*)    All other components within normal limits  TROPONIN I  TROPONIN I  H PYLORI, IGM, IGG, IGA AB    RADIOLOGY  Chest x-ray  IMPRESSION: Peribronchial thickening and increased markings in the lung bases, likely bronchitic changes and bibasilar atelectasis. ____________________________________________  DIFFERENTIAL DIAGNOSIS   Peptic ulcer disease, GERD, unstable angina, MI, PE unlikely  FINAL ASSESSMENT AND PLAN  Epigastric pain   Plan: The patient had presented for epigastric pain. Patient's labs were negative regarding his troponin.  He did have an elevated creatinine compared to prior but not significantly so.  Patient's imaging did not reveal any acute process.  He was given a GI cocktail with some improvement in his symptoms.  His repeat troponin was negative.  We will increase his Protonix as well as provide Carafate and viscous lidocaine as needed.  He will be referred to GI for outpatient follow-up.   Laurence Aly, MD   Note: This note was generated in part or whole with voice recognition software. Voice recognition is usually quite accurate but there are transcription errors that can and very often do occur. I apologize for any typographical errors that were not detected and corrected.     Earleen Newport, MD 09/30/17 1452

## 2017-09-30 NOTE — ED Notes (Signed)
Pt reports relief of pain with GI cocktail. Informed MD that wife and pt were asking about how much longer until DC.

## 2017-09-30 NOTE — ED Notes (Signed)
MD at bedside. 

## 2017-09-30 NOTE — ED Notes (Signed)
First RN note: epigastric pain that started Saturday, worsening today, hx of blood clot in left leg 2019, appears in NAD.

## 2017-10-01 ENCOUNTER — Telehealth (INDEPENDENT_AMBULATORY_CARE_PROVIDER_SITE_OTHER): Payer: Self-pay

## 2017-10-01 LAB — H PYLORI, IGM, IGG, IGA AB
H Pylori IgG: 0.33 Index Value (ref 0.00–0.79)
H. PYLOGI, IGA ABS: 24.6 U — AB (ref 0.0–8.9)

## 2017-10-01 NOTE — Telephone Encounter (Signed)
Dr. Sharlet Salina called wanting to hold the patients Xarelto for 2 days prior to having a cervical epidural pending 10/08/17.

## 2017-10-01 NOTE — Telephone Encounter (Signed)
I called Dr. Sharlet Salina office and gave Dr. Bunnie Domino recommendation to the nurse.

## 2017-10-13 ENCOUNTER — Other Ambulatory Visit: Payer: Self-pay

## 2017-10-13 ENCOUNTER — Telehealth: Payer: Self-pay

## 2017-10-13 NOTE — Telephone Encounter (Signed)
-----   Message from Lucilla Lame, MD sent at 10/03/2017  4:32 PM EDT ----- This patient H. pylori was positive and should be treated.  He should then have his stool or breath test checked after treatment.

## 2017-10-13 NOTE — Telephone Encounter (Signed)
Let the patient know that it is a very good question and something that may need to be addressed with his transplant surgeon or nephrologist who is taking care of him.  There are some cross reactions with Biaxin but has been used safely in the past but should be okayed by his kidney specialist first.

## 2017-10-13 NOTE — Telephone Encounter (Signed)
Tried contacting pt but unable to leave a vm.

## 2017-10-13 NOTE — Telephone Encounter (Signed)
Pt returned call regarding results.   Dr. Allen Norris, pt had a kidney transplant. Please advise if Clarithromycin and amoxicillin will not harm his kidney. Please review his recent BMP and advise.

## 2017-10-13 NOTE — Telephone Encounter (Signed)
Pt notified to contact his kidney transplant surgeon or nephrologist to decide he he can the required antibiotics to treat his h pylori.

## 2017-10-14 ENCOUNTER — Telehealth: Payer: Self-pay | Admitting: Gastroenterology

## 2017-10-14 NOTE — Telephone Encounter (Signed)
Patient calling back to see if y'all could be find a medicine for his ulcer to work with his kidney transplant.

## 2017-10-16 ENCOUNTER — Other Ambulatory Visit: Payer: Self-pay

## 2017-10-16 MED ORDER — TETRACYCLINE HCL 500 MG PO CAPS: 500.0000 mg | ORAL_CAPSULE | Freq: Four times a day (QID) | ORAL | 0 refills | Status: AC

## 2017-10-16 MED ORDER — METRONIDAZOLE 500 MG PO TABS: 500.0000 mg | ORAL_TABLET | Freq: Two times a day (BID) | ORAL | 0 refills | Status: AC

## 2017-10-16 NOTE — Telephone Encounter (Signed)
FYI.Marland KitchenMarland KitchenI spoke with his transplant surgeon's nurse, Levada Dy. She stated he is okay to take tetracycline and Metronidazole. Just letting you know. I'll go ahead and send medications to the pharmacy.

## 2017-10-26 MED ORDER — DEXTROSE 5 % IV SOLN
2.0000 g | Freq: Once | INTRAVENOUS | Status: AC
  Administered 2017-10-27: 2 g via INTRAVENOUS
  Filled 2017-10-26: qty 20

## 2017-10-26 NOTE — Discharge Instructions (Signed)
°  Instructions after Total Knee Replacement ° ° Ananiah Maciolek P. Tiannah Greenly, Jr., M.D.    ° Dept. of Orthopaedics & Sports Medicine ° [facility] ° [address] ° [place], [place]  [address] ° Phone: [phone]   Fax: [phone] ° °  °DIET: °• Drink plenty of non-alcoholic fluids. °• Resume your normal diet. Include foods high in fiber. ° °ACTIVITY:  °• You may use crutches or a walker with weight-bearing as tolerated, unless instructed otherwise. °• You may be weaned off of the walker or crutches by your Physical Therapist.  °• Do NOT place pillows under the knee. Anything placed under the knee could limit your ability to straighten the knee.   °• Continue doing gentle exercises. Exercising will reduce the pain and swelling, increase motion, and prevent muscle weakness.   °• Please continue to use the TED compression stockings for 6 weeks. You may remove the stockings at night, but should reapply them in the morning. °• Do not drive or operate any equipment until instructed. ° °WOUND CARE:  °• Continue to use the PolarCare or ice packs periodically to reduce pain and swelling. °• You may bathe or shower after the staples are removed at the first office visit following surgery. ° °MEDICATIONS: °• You may resume your regular medications. °• Please take the pain medication as prescribed on the medication. °• Do not take pain medication on an empty stomach. °• You have been given a prescription for a blood thinner (Lovenox or Coumadin). Please take the medication as instructed. (NOTE: After completing a 2 week course of Lovenox, take one Enteric-coated aspirin once a day. This along with elevation will help reduce the possibility of phlebitis in your operated leg.) °• Do not drive or drink alcoholic beverages when taking pain medications. ° °CALL THE OFFICE FOR: °• Temperature above 101 degrees °• Excessive bleeding or drainage on the dressing. °• Excessive swelling, coldness, or paleness of the toes. °• Persistent  nausea and vomiting. ° °FOLLOW-UP:  °• You should have an appointment to return to the office in 10-14 days after surgery. °• Arrangements have been made for continuation of Physical Therapy (either home therapy or outpatient therapy). °  °

## 2017-10-27 ENCOUNTER — Encounter
Admission: RE | Disposition: A | Discharge status: Home / Self Care | Payer: Self-pay | Source: Ambulatory Visit | Attending: Vascular Surgery

## 2017-10-27 ENCOUNTER — Ambulatory Visit
Admission: RE | Admit: 2017-10-27 | Discharge: 2017-10-27 | Disposition: A | Discharge status: Home / Self Care | Payer: BLUE CROSS/BLUE SHIELD | Source: Ambulatory Visit | Attending: Vascular Surgery | Admitting: Vascular Surgery

## 2017-10-27 DIAGNOSIS — F17210 Nicotine dependence, cigarettes, uncomplicated: Secondary | ICD-10-CM | POA: Diagnosis not present

## 2017-10-27 DIAGNOSIS — I82412 Acute embolism and thrombosis of left femoral vein: Secondary | ICD-10-CM | POA: Diagnosis not present

## 2017-10-27 DIAGNOSIS — Z940 Kidney transplant status: Secondary | ICD-10-CM | POA: Diagnosis not present

## 2017-10-27 DIAGNOSIS — K219 Gastro-esophageal reflux disease without esophagitis: Secondary | ICD-10-CM | POA: Diagnosis not present

## 2017-10-27 DIAGNOSIS — I82409 Acute embolism and thrombosis of unspecified deep veins of unspecified lower extremity: Secondary | ICD-10-CM

## 2017-10-27 DIAGNOSIS — M1990 Unspecified osteoarthritis, unspecified site: Secondary | ICD-10-CM | POA: Diagnosis not present

## 2017-10-27 DIAGNOSIS — Z9889 Other specified postprocedural states: Secondary | ICD-10-CM | POA: Diagnosis not present

## 2017-10-27 DIAGNOSIS — K922 Gastrointestinal hemorrhage, unspecified: Secondary | ICD-10-CM | POA: Insufficient documentation

## 2017-10-27 DIAGNOSIS — I10 Essential (primary) hypertension: Secondary | ICD-10-CM | POA: Diagnosis not present

## 2017-10-27 DIAGNOSIS — K284 Chronic or unspecified gastrojejunal ulcer with hemorrhage: Secondary | ICD-10-CM | POA: Diagnosis not present

## 2017-10-27 HISTORY — PX: IVC FILTER INSERTION: CATH118245

## 2017-10-27 SURGERY — IVC FILTER INSERTION
Anesthesia: Moderate Sedation | Laterality: Left

## 2017-10-27 MED ORDER — HYDROMORPHONE HCL 1 MG/ML IJ SOLN: 1.0000 mg | Freq: Once | INTRAMUSCULAR | Status: DC | PRN

## 2017-10-27 MED ORDER — SODIUM CHLORIDE 0.9 % IV SOLN
INTRAVENOUS | Status: DC
  Administered 2017-10-27: 07:00:00 via INTRAVENOUS

## 2017-10-27 MED ORDER — IOPAMIDOL (ISOVUE-300) INJECTION 61%
INTRAVENOUS | Status: DC | PRN
  Administered 2017-10-27: 15 mL via INTRA_ARTERIAL

## 2017-10-27 MED ORDER — MIDAZOLAM HCL 2 MG/2ML IJ SOLN
INTRAMUSCULAR | Status: DC | PRN
  Administered 2017-10-27: 2 mg via INTRAVENOUS
  Administered 2017-10-27: 1 mg via INTRAVENOUS

## 2017-10-27 MED ORDER — MIDAZOLAM HCL 5 MG/5ML IJ SOLN
INTRAMUSCULAR | Status: AC
  Filled 2017-10-27: qty 5

## 2017-10-27 MED ORDER — ONDANSETRON HCL 4 MG/2ML IJ SOLN: 4.0000 mg | Freq: Four times a day (QID) | INTRAMUSCULAR | Status: DC | PRN

## 2017-10-27 MED ORDER — LIDOCAINE-EPINEPHRINE (PF) 1 %-1:200000 IJ SOLN
INTRAMUSCULAR | Status: AC
  Filled 2017-10-27: qty 30

## 2017-10-27 MED ORDER — CEFAZOLIN SODIUM-DEXTROSE 2-4 GM/100ML-% IV SOLN
INTRAVENOUS | Status: AC
  Filled 2017-10-27: qty 100

## 2017-10-27 MED ORDER — FENTANYL CITRATE (PF) 100 MCG/2ML IJ SOLN
INTRAMUSCULAR | Status: DC | PRN
  Administered 2017-10-27: 50 ug via INTRAVENOUS
  Administered 2017-10-27: 25 ug via INTRAVENOUS

## 2017-10-27 MED ORDER — FENTANYL CITRATE (PF) 100 MCG/2ML IJ SOLN
INTRAMUSCULAR | Status: AC
  Filled 2017-10-27: qty 2

## 2017-10-27 MED ORDER — HEPARIN (PORCINE) IN NACL 1000-0.9 UT/500ML-% IV SOLN
INTRAVENOUS | Status: AC
  Filled 2017-10-27: qty 500

## 2017-10-27 SURGICAL SUPPLY — 3 items
FILTER VC CELECT-FEMORAL (Filter) ×3 IMPLANT
PACK ANGIOGRAPHY (CUSTOM PROCEDURE TRAY) ×3 IMPLANT
WIRE J 3MM .035X145CM (WIRE) ×3 IMPLANT

## 2017-10-27 NOTE — Discharge Instructions (Signed)
Moderate Conscious Sedation, Adult, Care After °These instructions provide you with information about caring for yourself after your procedure. Your health care provider may also give you more specific instructions. Your treatment has been planned according to current medical practices, but problems sometimes occur. Call your health care provider if you have any problems or questions after your procedure. °What can I expect after the procedure? °After your procedure, it is common: °· To feel sleepy for several hours. °· To feel clumsy and have poor balance for several hours. °· To have poor judgment for several hours. °· To vomit if you eat too soon. ° °Follow these instructions at home: °For at least 24 hours after the procedure: ° °· Do not: °? Participate in activities where you could fall or become injured. °? Drive. °? Use heavy machinery. °? Drink alcohol. °? Take sleeping pills or medicines that cause drowsiness. °? Make important decisions or sign legal documents. °? Take care of children on your own. °· Rest. °Eating and drinking °· Follow the diet recommended by your health care provider. °· If you vomit: °? Drink water, juice, or soup when you can drink without vomiting. °? Make sure you have little or no nausea before eating solid foods. °General instructions °· Have a responsible adult stay with you until you are awake and alert. °· Take over-the-counter and prescription medicines only as told by your health care provider. °· If you smoke, do not smoke without supervision. °· Keep all follow-up visits as told by your health care provider. This is important. °Contact a health care provider if: °· You keep feeling nauseous or you keep vomiting. °· You feel light-headed. °· You develop a rash. °· You have a fever. °Get help right away if: °· You have trouble breathing. °This information is not intended to replace advice given to you by your health care provider. Make sure you discuss any questions you have  with your health care provider. °Document Released: 10/21/2012 Document Revised: 06/05/2015 Document Reviewed: 04/22/2015 °Elsevier Interactive Patient Education © 2018 [employer] °Inferior Vena Cava Filter Insertion, Care After °This sheet gives you information about how to care for yourself after your procedure. Your health care provider may also give you more specific instructions. If you have problems or questions, contact your health care provider. °What can I expect after the procedure? °After your procedure, it is common to have: °· Mild pain in the area where the filter was inserted. °· Mild bruising in the area where the filter was inserted. ° °Follow these instructions at home: °Insertion site care °· Follow instructions from your health care provider about how to take care of the site where a catheter was inserted at your neck or groin (insertion site). Make sure you: °? Wash your hands with soap and water before you change your bandage (dressing). If soap and water are not available, use hand sanitizer. °? Change your dressing as told by your health care provider. °· Check your insertion site every day for signs of infection. Check for: °? More redness, swelling, or pain. °? More fluid or blood. °? Warmth. °? Pus or a bad smell. °· Keep the insertion site clean and dry. °· Do not shower, bathe, use a hot tub, or let the dressing get wet until your health care provider approves. °General instructions °· Take over-the-counter and prescription medicines only as told by your health care provider. °· Avoid heavy lifting or hard activities for 48 hours after the procedure or as   told by your health care provider. °· Do not drive for 24 hours if you were given a a medicine to help you relax (sedative). °· Do not drive or use heavy machinery while taking prescription pain medicine. °· Do not go back to school or work until your health care provider approves. °· Keep all follow-up visits as told by your health  care provider. This is important. °Contact a health care provider if: °· You have more redness, swelling, or pain around your insertion site. °· You have more fluid or blood coming from your insertion site. °· Your insertion site feels warm to the touch. °· You have pus or a bad smell coming from your insertion site. °· You have a fever. °· You are dizzy. °· You have nausea and vomiting. °· You develop a rash. °Get help right away if: °· You develop chest pain, a cough, or difficulty breathing. °· You develop shortness of breath, feel faint, or pass out. °· You cough up blood. °· You have severe pain in your abdomen. °· You develop swelling and discoloration or pain in your legs. °· Your legs become pale and cold or blue. °· You develop weakness, difficulty moving your arms or legs, or balance problems. °· You develop problems with speech or vision. °These symptoms may represent a serious problem that is an emergency. Do not wait to see if the symptoms will go away. Get medical help right away. Call your local emergency services (911 in the [place] Do not drive yourself to the hospital. °Summary °· After your insertion procedure, it is common to have mild pain and bruising. °· Do not shower, bathe, use a hot tub, or let the dressing get wet until your health care provider approves. °· Every day, check for signs of infection where a catheter was inserted at your neck or groin (insertion site). °This information is not intended to replace advice given to you by your health care provider. Make sure you discuss any questions you have with your health care provider. °Document Released: 10/21/2012 Document Revised: 11/22/2015 Document Reviewed: 11/22/2015 °Elsevier Interactive Patient Education © 2017 Else[employer] Inc. ° °

## 2017-10-27 NOTE — H&P (Addendum)
[facility] Admission History & Physical  MRN : [id number]  Billy Hughes is a 52 y.o. (01-19-1965) male who presents with chief complaint of No chief complaint on file. Marland Kitchen  History of Present Illness: Patient presents today for IVC filter placement.  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  Now having GI bleeding issues and apparently has a gastric ulcer being treated by GI.  Also has an upcoming knee replacement later this month, and is at very high risk for thromboembolic complications around the time of his knee replacement.  Will need anticoagulation stopped for at least a short period of time and given his high risk status, an IVC filter seems to be the most appropriate next step.  No chest pain or shortness of breath.  No fevers or chills.  Current Facility-Administered Medications  Medication Dose Route Frequency Provider Last Rate Last Dose  . 0.9 %  sodium chloride infusion   Intravenous Continuous Kris Hartmann, NP 75 mL/hr at 10/27/17 0727    . ceFAZolin (ANCEF) 2 g in dextrose 5 % 50 mL IVPB  2 g Intravenous Once Eulogio Ditch E, NP      . ceFAZolin (ANCEF) 2-4 GM/100ML-% IVPB           . HYDROmorphone (DILAUDID) injection 1 mg  1 mg Intravenous Once PRN Kris Hartmann, NP      . ondansetron [facility]) injection 4 mg  4 mg Intravenous Q6H PRN Kris Hartmann, NP        Past Medical History:  Diagnosis Date  . Diverticulitis   . GERD (gastroesophageal reflux disease)   . Hypertension   . Renal disorder     Past Surgical History:  Procedure Laterality Date  . COLON RESECTION    . DIALYSIS FISTULA CREATION    . KIDNEY TRANSPLANT    . NASAL RECONSTRUCTION      Social History Social History   Tobacco Use  . Smoking status: Current Every Day Smoker    Packs/day: 1.00    Types: Cigarettes  . Smokeless tobacco: Never Used  Substance Use Topics  . Alcohol use: Yes   Comment: social  . Drug use: No    Family History Family History  Problem Relation Age of Onset  . Cancer Mother   . Cancer Father   no porphyria, aneurysm, or autoimmune disease  No Known Allergies   REVIEW OF SYSTEMS (Negative unless checked)  Constitutional: [] Weight loss  [] Fever  [] Chills Cardiac: [] Chest pain   [] Chest pressure   [] Palpitations   [] Shortness of breath when laying flat   [] Shortness of breath at rest   [] Shortness of breath with exertion. Vascular:  [] Pain in legs with walking   [] Pain in legs at rest   [] Pain in legs when laying flat   [] Claudication   [] Pain in feet when walking  [] Pain in feet at rest  [] Pain in feet when laying flat   [x] History of DVT   [x] Phlebitis   [x] Swelling in legs   [] Varicose veins   [] Non-healing ulcers Pulmonary:   [] Uses home oxygen   [] Productive cough   [] Hemoptysis   [] Wheeze  [] COPD   [] Asthma Neurologic:  [] Dizziness  [] Blackouts   [] Seizures   [] History of stroke   [] History of TIA  [] Aphasia   [] Temporary blindness   [] Dysphagia   [] Weakness or numbness in arms   [] Weakness or numbness in legs Musculoskeletal:  [] Arthritis   []   Joint swelling   [] Joint pain   [] Low back pain Hematologic:  [] Easy bruising  [] Easy bleeding   [] Hypercoagulable state   [] Anemic  [] Hepatitis Gastrointestinal:  [x] Blood in stool   [] Vomiting blood  [x] Gastroesophageal reflux/heartburn   [] Difficulty swallowing. Genitourinary:  [] Chronic kidney disease   [] Difficult urination  [] Frequent urination  [] Burning with urination   [] Blood in urine Skin:  [] Rashes   [] Ulcers   [] Wounds Psychological:  [] History of anxiety   []  History of major depression.  Physical Examination  Vitals:   10/27/17 0714  Pulse: 68  Resp: 20  Temp: 98.1 F (36.7 C)  SpO2: 97%  Weight: 127 kg  Height: 6' (1.829 m)   Body mass index is 37.97 kg/m. Gen: WD/WN, NAD. Obese  Head: [place]/AT, No temporalis wasting.  Ear/Nose/Throat: Hearing grossly intact, nares w/o  erythema or drainage, oropharynx w/o Erythema/Exudate,  Eyes: Conjunctiva clear, sclera non-icteric Neck: Trachea midline.  No JVD.  Pulmonary:  Good air movement, respirations not labored, no use of accessory muscles.  Cardiac: RRR, normal S1, S2. Vascular:  Vessel Right Left  Radial Palpable Palpable                                    Musculoskeletal: M/S 5/5 throughout.  Extremities without ischemic changes.  No deformity or atrophy.  Neurologic: Sensation grossly intact in extremities.  Symmetrical.  Speech is fluent. Motor exam as listed above. Psychiatric: Judgment intact, Mood & affect appropriate for pt's clinical situation. Dermatologic: No rashes or ulcers noted.  No cellulitis or open wounds.      CBC Lab Results  Component Value Date   WBC 11.5 (H) 09/30/2017   HGB 16.5 09/30/2017   HCT 46.8 09/30/2017   MCV 86.1 09/30/2017   PLT 228 09/30/2017    BMET    Component Value Date/Time   NA 134 (L) 09/30/2017 1123   NA 135 04/12/2014 0240   K 4.2 09/30/2017 1123   K 3.8 04/12/2014 0240   CL 99 09/30/2017 1123   CL 103 04/12/2014 0240   CO2 25 09/30/2017 1123   CO2 24 04/12/2014 0240   GLUCOSE 124 (H) 09/30/2017 1123   GLUCOSE 129 (H) 04/12/2014 0240   BUN 20 09/30/2017 1123   BUN 17 04/12/2014 0240   CREATININE 1.81 (H) 09/30/2017 1123   CREATININE 1.41 (H) 04/12/2014 0240   CALCIUM 10.0 09/30/2017 1123   CALCIUM 9.2 04/12/2014 0240   GFRNONAA 41 (L) 09/30/2017 1123   GFRNONAA 58 (L) 04/12/2014 0240   GFRAA 48 (L) 09/30/2017 1123   GFRAA >60 04/12/2014 0240   CrCl cannot be calculated (Patient's most recent lab result is older than the maximum 21 days allowed.).  COAG No results found for: INR, PROTIME  Radiology Dg Chest 2 View  Result Date: 09/30/2017 CLINICAL DATA:  Chest pain EXAM: CHEST - 2 VIEW COMPARISON:  03/07/2015 FINDINGS: Heart is normal size. Peribronchial thickening. Increased markings in the lung bases. No confluent opacities  or effusions. No acute bony abnormality. IMPRESSION: Peribronchial thickening and increased markings in the lung bases, likely bronchitic changes and bibasilar atelectasis. Electronically Signed   By: Rolm Baptise M.D.   On: 09/30/2017 11:33     Assessment/Plan 1. DVT with GI bleeding issues as well as upcoming knee replacement. High risk patient so temporary filter is a reasonable option.  Risks and benefits discussed.  Plan removal in 6 to 8 weeks  after his knee replacement. 2. Upper GI bleed.  1 of the reasons for filter as above 3. HTN. Stable on outpatient medications and blood pressure control important in reducing the progression of atherosclerotic disease. On appropriate oral medications. 4.  Osteoarthritis requiring knee replacement.  Very high risk for thromboembolic complications around the time of surgery with a recent DVT, and a temporary IVC filter is appropriate.    Leotis Pain, MD  10/27/2017 8:07 AM

## 2017-10-27 NOTE — Op Note (Signed)
[facility]   OPERATIVE NOTE    PRE-OPERATIVE DIAGNOSIS: Recent DVT with upcoming knee replacement as well as peptic ulcer disease and GI bleeding  POST-OPERATIVE DIAGNOSIS: same as above  PROCEDURE: 1.   Ultrasound guidance for vascular access to the right femoral vein 2.   Catheter placement into the inferior vena cava 3.   Inferior venacavogram 4.   Placement of a Cook Celect IVC filter  SURGEON: Leotis Pain, MD  ASSISTANT(S): None  ANESTHESIA: local with Moderate Conscious Sedation for approximately 15 minutes using 3 mg of Versed and 75 mcg of Fentanyl  ESTIMATED BLOOD LOSS: minimal  CONTRAST: 15 cc  FLUORO TIME: less than one minute  FINDING(S): 1.  Patent IVC  SPECIMEN(S):  none  INDICATIONS:   Billy Hughes is a 52 y.o. male who presents with DVT this year and has an upcoming knee replacement making him very high risk for thromboembolic complications.  He is also having some GI bleeding issues from peptic ulcer disease.  Inferior vena cava filter is indicated for these reasons.  Risks and benefits including filter thrombosis, migration, fracture, bleeding, and infection were all discussed.  We discussed that all IVC filters that we place can be removed if desired from the patient once the need for the filter has passed.    DESCRIPTION: After obtaining full informed written consent, the patient was brought back to the vascular suite. The skin was sterilely prepped and draped in a sterile surgical field was created. Moderate conscious sedation was administered during a face to face encounter with the patient throughout the procedure with my supervision of the RN administering medicines and monitoring the patient's vital signs, pulse oximetry, telemetry and mental status throughout from the start of the procedure until the patient was taken to the recovery room. The right femoral vein was accessed under direct ultrasound guidance without  difficulty with a Seldinger needle and a J-wire was then placed. After skin nick and dilatation, the delivery sheath was placed into the inferior vena cava and an inferior venacavogram was performed. This demonstrated a patent IVC with the level of the renal veins at L1.  The filter was then deployed into the inferior vena cava at the level of L2 just below the renal veins. The delivery sheath was then removed. Pressure was held. Sterile dressings were placed. The patient tolerated the procedure well and was taken to the recovery room in stable condition.  COMPLICATIONS: None  CONDITION: Stable  Leotis Pain  10/27/2017, 8:34 AM   This note was created with Dragon Medical transcription system. Any errors in dictation are purely unintentional.

## 2017-10-28 ENCOUNTER — Encounter: Payer: Self-pay | Admitting: Vascular Surgery

## 2017-10-29 ENCOUNTER — Other Ambulatory Visit: Payer: Self-pay

## 2017-10-29 ENCOUNTER — Encounter
Admission: RE | Admit: 2017-10-29 | Discharge: 2017-10-29 | Disposition: A | Discharge status: Home / Self Care | Payer: BLUE CROSS/BLUE SHIELD | Source: Ambulatory Visit | Attending: Orthopedic Surgery | Admitting: Orthopedic Surgery

## 2017-10-29 DIAGNOSIS — Z01818 Encounter for other preprocedural examination: Secondary | ICD-10-CM | POA: Diagnosis not present

## 2017-10-29 DIAGNOSIS — M1712 Unilateral primary osteoarthritis, left knee: Secondary | ICD-10-CM | POA: Insufficient documentation

## 2017-10-29 HISTORY — DX: Anxiety disorder, unspecified: F41.9

## 2017-10-29 HISTORY — DX: Type 2 diabetes mellitus without complications: E11.9

## 2017-10-29 HISTORY — DX: Malignant (primary) neoplasm, unspecified: C80.1

## 2017-10-29 HISTORY — DX: Gastric ulcer, unspecified as acute or chronic, without hemorrhage or perforation: K25.9

## 2017-10-29 HISTORY — DX: Anemia, unspecified: D64.9

## 2017-10-29 LAB — URINALYSIS, ROUTINE W REFLEX MICROSCOPIC
BILIRUBIN URINE: NEGATIVE
Bacteria, UA: NONE SEEN
GLUCOSE, UA: NEGATIVE mg/dL
Ketones, ur: NEGATIVE mg/dL
Leukocytes, UA: NEGATIVE
Nitrite: NEGATIVE
Protein, ur: 30 mg/dL — AB
SPECIFIC GRAVITY, URINE: 1.013 (ref 1.005–1.030)
SQUAMOUS EPITHELIAL / LPF: NONE SEEN (ref 0–5)
pH: 6 (ref 5.0–8.0)

## 2017-10-29 LAB — APTT: aPTT: 40 seconds — ABNORMAL HIGH (ref 24–36)

## 2017-10-29 LAB — TYPE AND SCREEN
ABO/RH(D): A POS
Antibody Screen: NEGATIVE

## 2017-10-29 LAB — SURGICAL PCR SCREEN
MRSA, PCR: NEGATIVE
STAPHYLOCOCCUS AUREUS: NEGATIVE

## 2017-10-29 LAB — SEDIMENTATION RATE: Sed Rate: 9 mm/hr (ref 0–20)

## 2017-10-29 LAB — PROTIME-INR
INR: 1.54
Prothrombin Time: 18.3 seconds — ABNORMAL HIGH (ref 11.4–15.2)

## 2017-10-29 LAB — C-REACTIVE PROTEIN: CRP: 2.4 mg/dL — ABNORMAL HIGH (ref <1.0)

## 2017-10-29 NOTE — Patient Instructions (Signed)
Your procedure is scheduled on: Wednesday 11/12/17 Report to [place]. To find out your arrival time please call [phone] between 1PM - 3PM on Tuesday 11/11/17.  Remember: Instructions that are not followed completely may result in serious medical risk, up to and including death, or upon the discretion of your surgeon and anesthesiologist your surgery may need to be rescheduled.     _X__ 1. Do not eat food after midnight the night before your procedure.                 No gum chewing or hard candies. You may drink clear liquids up to 2 hours                 before you are scheduled to arrive for your surgery- DO not drink clear                 liquids within 2 hours of the start of your surgery.                 Clear Liquids include:  water, apple juice without pulp, clear carbohydrate                 drink such as Clearfast or Gatorade, Black Coffee or Tea (Do not add                 anything to coffee or tea).  __X__2.  On the morning of surgery brush your teeth with toothpaste and water, you                 may rinse your mouth with mouthwash if you wish.  Do not swallow any              toothpaste of mouthwash.     _X__ 3.  No Alcohol for 24 hours before or after surgery.   _X__ 4.  Do Not Smoke or use e-cigarettes For 24 Hours Prior to Your Surgery.                 Do not use any chewable tobacco products for at least 6 hours prior to                 surgery.  ____  5.  Bring all medications with you on the day of surgery if instructed.   __X__  6.  Notify your doctor if there is any change in your medical condition      (cold, fever, infections).     Do not wear jewelry, make-up, hairpins, clips or nail polish. Do not wear lotions, powders, or perfumes.  Do not shave 48 hours prior to surgery. Men may shave face and neck. Do not bring valuables to the hospital.    [facility] is not responsible for any belongings or  valuables.  Contacts, dentures/partials or body piercings may not be worn into surgery. Bring a case for your contacts, glasses or hearing aids, a denture cup will be supplied. Leave your suitcase in the car. After surgery it may be brought to your room. For patients admitted to the hospital, discharge time is determined by your treatment team.   Patients discharged the day of surgery will not be allowed to drive home.   Please read over the following fact sheets that you were given:   MRSA Information  __X__ Take these medicines the morning of surgery with A SIP OF WATER:  1. amLODipine (NORVASC)  2. atorvastatin (LIPITOR)  3. metoprolol succinate (TOPROL-XL)   4. pantoprazole (PROTONIX)  5. tacrolimus (PROGRAF)  6. mycophenolate (MYFORTIC)  ____ Fleet Enema (as directed)   __X__ Use CHG Soap/SAGE wipes as directed  ____ Use inhalers on the day of surgery  __X__ Stop metformin/Janumet/Farxiga 2 days prior to surgery    ____ Take 1/2 of usual insulin dose the night before surgery. No insulin the morning          of surgery.   __X__ Stop Blood Thinners Coumadin/Plavix/Xarelto/Pleta/Pradaxa/Eliquis/Effient/Aspirin  on   Or contact your Surgeon, Cardiologist or Medical Doctor regarding  ability to stop your blood thinners  STOP ACCORDING TO PREVIUS INSTRUCTIONS  __X__ Stop Anti-inflammatories 7 days before surgery such as Advil, Ibuprofen, Motrin,  BC or Goodies Powder, Naprosyn, Naproxen, Aleve   __X__ Stop all herbal supplements, fish oil or vitamin E until after surgery.  OK TO CONTINUE MAGNESIUM  ____ Bring C-Pap to the hospital.

## 2017-10-30 LAB — URINE CULTURE
CULTURE: NO GROWTH
SPECIAL REQUESTS: NORMAL

## 2017-11-11 MED ORDER — CEFAZOLIN SODIUM-DEXTROSE 2-4 GM/100ML-% IV SOLN: 2.0000 g | INTRAVENOUS | Status: DC

## 2017-11-11 MED ORDER — TRANEXAMIC ACID-NACL 1000-0.7 MG/100ML-% IV SOLN
1000.0000 mg | INTRAVENOUS | Status: DC
  Filled 2017-11-11: qty 100

## 2017-11-12 ENCOUNTER — Inpatient Hospital Stay: Payer: BLUE CROSS/BLUE SHIELD | Admitting: Certified Registered"

## 2017-11-12 ENCOUNTER — Encounter: Payer: Self-pay | Admitting: Orthopedic Surgery

## 2017-11-12 ENCOUNTER — Inpatient Hospital Stay: Payer: BLUE CROSS/BLUE SHIELD

## 2017-11-12 ENCOUNTER — Inpatient Hospital Stay
Admission: RE | Admit: 2017-11-12 | Discharge: 2017-11-14 | DRG: 470 | Disposition: A | Discharge status: Home Health | Payer: BLUE CROSS/BLUE SHIELD | Source: Ambulatory Visit | Attending: Orthopedic Surgery | Admitting: Orthopedic Surgery

## 2017-11-12 ENCOUNTER — Other Ambulatory Visit: Payer: Self-pay

## 2017-11-12 ENCOUNTER — Encounter
Admission: RE | Disposition: A | Discharge status: Home Health | Payer: Self-pay | Source: Ambulatory Visit | Attending: Orthopedic Surgery

## 2017-11-12 DIAGNOSIS — R0902 Hypoxemia: Secondary | ICD-10-CM

## 2017-11-12 DIAGNOSIS — K219 Gastro-esophageal reflux disease without esophagitis: Secondary | ICD-10-CM | POA: Diagnosis present

## 2017-11-12 DIAGNOSIS — Z940 Kidney transplant status: Secondary | ICD-10-CM | POA: Diagnosis not present

## 2017-11-12 DIAGNOSIS — Z6839 Body mass index (BMI) 39.0-39.9, adult: Secondary | ICD-10-CM | POA: Diagnosis not present

## 2017-11-12 DIAGNOSIS — Z85828 Personal history of other malignant neoplasm of skin: Secondary | ICD-10-CM | POA: Diagnosis not present

## 2017-11-12 DIAGNOSIS — E785 Hyperlipidemia, unspecified: Secondary | ICD-10-CM | POA: Diagnosis present

## 2017-11-12 DIAGNOSIS — Z86718 Personal history of other venous thrombosis and embolism: Secondary | ICD-10-CM | POA: Diagnosis not present

## 2017-11-12 DIAGNOSIS — E119 Type 2 diabetes mellitus without complications: Secondary | ICD-10-CM | POA: Diagnosis present

## 2017-11-12 DIAGNOSIS — Z7982 Long term (current) use of aspirin: Secondary | ICD-10-CM

## 2017-11-12 DIAGNOSIS — Z96659 Presence of unspecified artificial knee joint: Secondary | ICD-10-CM

## 2017-11-12 DIAGNOSIS — Z79899 Other long term (current) drug therapy: Secondary | ICD-10-CM

## 2017-11-12 DIAGNOSIS — M1712 Unilateral primary osteoarthritis, left knee: Secondary | ICD-10-CM | POA: Diagnosis present

## 2017-11-12 DIAGNOSIS — F17200 Nicotine dependence, unspecified, uncomplicated: Secondary | ICD-10-CM | POA: Diagnosis present

## 2017-11-12 DIAGNOSIS — F329 Major depressive disorder, single episode, unspecified: Secondary | ICD-10-CM | POA: Diagnosis present

## 2017-11-12 DIAGNOSIS — Z7984 Long term (current) use of oral hypoglycemic drugs: Secondary | ICD-10-CM | POA: Diagnosis not present

## 2017-11-12 DIAGNOSIS — I10 Essential (primary) hypertension: Secondary | ICD-10-CM | POA: Diagnosis present

## 2017-11-12 HISTORY — PX: KNEE ARTHROPLASTY: SHX992

## 2017-11-12 LAB — PROTIME-INR
INR: 0.96
Prothrombin Time: 12.7 seconds (ref 11.4–15.2)

## 2017-11-12 LAB — GLUCOSE, CAPILLARY
GLUCOSE-CAPILLARY: 141 mg/dL — AB (ref 70–99)
GLUCOSE-CAPILLARY: 291 mg/dL — AB (ref 70–99)
Glucose-Capillary: 162 mg/dL — ABNORMAL HIGH (ref 70–99)

## 2017-11-12 LAB — ABO/RH: ABO/RH(D): A POS

## 2017-11-12 SURGERY — ARTHROPLASTY, KNEE, TOTAL, USING IMAGELESS COMPUTER-ASSISTED NAVIGATION
Anesthesia: Spinal | Site: Knee | Laterality: Left

## 2017-11-12 MED ORDER — SODIUM CHLORIDE 0.9 % IV SOLN
INTRAVENOUS | Status: DC
  Administered 2017-11-12: 18:00:00 via INTRAVENOUS

## 2017-11-12 MED ORDER — PROPOFOL 500 MG/50ML IV EMUL
INTRAVENOUS | Status: AC
  Filled 2017-11-12: qty 50

## 2017-11-12 MED ORDER — ACETAMINOPHEN 10 MG/ML IV SOLN
INTRAVENOUS | Status: DC | PRN
  Administered 2017-11-12 (×2): 1000 mg via INTRAVENOUS

## 2017-11-12 MED ORDER — POLYVINYL ALCOHOL 1.4 % OP SOLN
1.0000 [drp] | OPHTHALMIC | Status: DC | PRN
  Administered 2017-11-12: 1 [drp] via OPHTHALMIC
  Filled 2017-11-12: qty 15

## 2017-11-12 MED ORDER — ONDANSETRON HCL 4 MG/2ML IJ SOLN
INTRAMUSCULAR | Status: AC
  Filled 2017-11-12: qty 2

## 2017-11-12 MED ORDER — MYCOPHENOLATE SODIUM 180 MG PO TBEC
360.0000 mg | DELAYED_RELEASE_TABLET | Freq: Two times a day (BID) | ORAL | Status: DC
  Administered 2017-11-12 – 2017-11-14 (×4): 360 mg via ORAL
  Filled 2017-11-12 (×2): qty 2
  Filled 2017-11-12: qty 1
  Filled 2017-11-12 (×3): qty 2

## 2017-11-12 MED ORDER — NEOMYCIN-POLYMYXIN B GU 40-200000 IR SOLN
Status: AC
  Filled 2017-11-12: qty 20

## 2017-11-12 MED ORDER — BISACODYL 10 MG RE SUPP: 10.0000 mg | Freq: Every day | RECTAL | Status: DC | PRN

## 2017-11-12 MED ORDER — FENTANYL CITRATE (PF) 100 MCG/2ML IJ SOLN: 25.0000 ug | INTRAMUSCULAR | Status: DC | PRN

## 2017-11-12 MED ORDER — BUPIVACAINE LIPOSOME 1.3 % IJ SUSP
INTRAMUSCULAR | Status: AC
  Filled 2017-11-12: qty 20

## 2017-11-12 MED ORDER — ATORVASTATIN CALCIUM 10 MG PO TABS
10.0000 mg | ORAL_TABLET | Freq: Every day | ORAL | Status: DC
  Administered 2017-11-13 – 2017-11-14 (×2): 10 mg via ORAL
  Filled 2017-11-12 (×2): qty 1

## 2017-11-12 MED ORDER — TETRACAINE HCL 1 % IJ SOLN
INTRAMUSCULAR | Status: AC
  Filled 2017-11-12: qty 2

## 2017-11-12 MED ORDER — AMLODIPINE BESYLATE 10 MG PO TABS
10.0000 mg | ORAL_TABLET | Freq: Every day | ORAL | Status: DC
  Administered 2017-11-13: 10 mg via ORAL
  Filled 2017-11-12: qty 1

## 2017-11-12 MED ORDER — ONDANSETRON HCL 4 MG PO TABS: 4.0000 mg | ORAL_TABLET | Freq: Four times a day (QID) | ORAL | Status: DC | PRN

## 2017-11-12 MED ORDER — BUPIVACAINE HCL (PF) 0.25 % IJ SOLN
INTRAMUSCULAR | Status: DC | PRN
  Administered 2017-11-12: 60 mL

## 2017-11-12 MED ORDER — FENTANYL CITRATE (PF) 100 MCG/2ML IJ SOLN
INTRAMUSCULAR | Status: AC
  Filled 2017-11-12: qty 2

## 2017-11-12 MED ORDER — ACETAMINOPHEN 10 MG/ML IV SOLN
1000.0000 mg | Freq: Four times a day (QID) | INTRAVENOUS | Status: AC
  Administered 2017-11-12 – 2017-11-13 (×3): 1000 mg via INTRAVENOUS
  Filled 2017-11-12 (×4): qty 100

## 2017-11-12 MED ORDER — GLYCOPYRROLATE 0.2 MG/ML IJ SOLN
INTRAMUSCULAR | Status: DC | PRN
  Administered 2017-11-12: 0.2 mg via INTRAVENOUS

## 2017-11-12 MED ORDER — PROPOFOL 500 MG/50ML IV EMUL
INTRAVENOUS | Status: DC | PRN
  Administered 2017-11-12: 100 ug/kg/min via INTRAVENOUS

## 2017-11-12 MED ORDER — CEFAZOLIN SODIUM-DEXTROSE 2-4 GM/100ML-% IV SOLN
INTRAVENOUS | Status: AC
  Filled 2017-11-12: qty 100

## 2017-11-12 MED ORDER — MENTHOL 3 MG MT LOZG
1.0000 | LOZENGE | OROMUCOSAL | Status: DC | PRN
  Filled 2017-11-12: qty 9

## 2017-11-12 MED ORDER — ONDANSETRON HCL 4 MG/2ML IJ SOLN
INTRAMUSCULAR | Status: DC | PRN
  Administered 2017-11-12: 4 mg via INTRAVENOUS

## 2017-11-12 MED ORDER — BUPIVACAINE HCL (PF) 0.25 % IJ SOLN
INTRAMUSCULAR | Status: AC
  Filled 2017-11-12: qty 30

## 2017-11-12 MED ORDER — GABAPENTIN 300 MG PO CAPS
300.0000 mg | ORAL_CAPSULE | Freq: Once | ORAL | Status: AC
  Administered 2017-11-12: 300 mg via ORAL

## 2017-11-12 MED ORDER — TRANEXAMIC ACID-NACL 1000-0.7 MG/100ML-% IV SOLN
INTRAVENOUS | Status: DC | PRN
  Administered 2017-11-12: 1000 mg via INTRAVENOUS

## 2017-11-12 MED ORDER — DIPHENHYDRAMINE HCL 12.5 MG/5ML PO ELIX: 12.5000 mg | ORAL_SOLUTION | ORAL | Status: DC | PRN

## 2017-11-12 MED ORDER — NICOTINE POLACRILEX 2 MG MT GUM
2.0000 mg | CHEWING_GUM | OROMUCOSAL | Status: DC | PRN
  Filled 2017-11-12: qty 1

## 2017-11-12 MED ORDER — SODIUM CHLORIDE 0.9 % IV SOLN
INTRAVENOUS | Status: DC | PRN
  Administered 2017-11-12: 60 mL

## 2017-11-12 MED ORDER — GABAPENTIN 300 MG PO CAPS
300.0000 mg | ORAL_CAPSULE | Freq: Every day | ORAL | Status: DC
  Administered 2017-11-12 – 2017-11-13 (×2): 300 mg via ORAL
  Filled 2017-11-12 (×2): qty 1

## 2017-11-12 MED ORDER — ACETAMINOPHEN 325 MG PO TABS: 325.0000 mg | ORAL_TABLET | Freq: Four times a day (QID) | ORAL | Status: DC | PRN

## 2017-11-12 MED ORDER — ONDANSETRON HCL 4 MG/2ML IJ SOLN: 4.0000 mg | Freq: Four times a day (QID) | INTRAMUSCULAR | Status: DC | PRN

## 2017-11-12 MED ORDER — FENTANYL CITRATE (PF) 100 MCG/2ML IJ SOLN
INTRAMUSCULAR | Status: DC | PRN
  Administered 2017-11-12 (×2): 50 ug via INTRAVENOUS

## 2017-11-12 MED ORDER — FERROUS SULFATE 325 (65 FE) MG PO TABS
325.0000 mg | ORAL_TABLET | Freq: Two times a day (BID) | ORAL | Status: DC
  Administered 2017-11-13 – 2017-11-14 (×3): 325 mg via ORAL
  Filled 2017-11-12 (×3): qty 1

## 2017-11-12 MED ORDER — PROPOFOL 10 MG/ML IV BOLUS
INTRAVENOUS | Status: DC | PRN
  Administered 2017-11-12: 50 mg via INTRAVENOUS

## 2017-11-12 MED ORDER — LIDOCAINE HCL (PF) 2 % IJ SOLN
INTRAMUSCULAR | Status: AC
  Filled 2017-11-12: qty 10

## 2017-11-12 MED ORDER — OXYCODONE HCL 5 MG PO TABS
5.0000 mg | ORAL_TABLET | ORAL | Status: DC | PRN
  Administered 2017-11-12 – 2017-11-13 (×3): 5 mg via ORAL
  Filled 2017-11-12: qty 1

## 2017-11-12 MED ORDER — MIDAZOLAM HCL 5 MG/5ML IJ SOLN
INTRAMUSCULAR | Status: DC | PRN
  Administered 2017-11-12: 2 mg via INTRAVENOUS

## 2017-11-12 MED ORDER — METOCLOPRAMIDE HCL 10 MG PO TABS
5.0000 mg | ORAL_TABLET | Freq: Three times a day (TID) | ORAL | Status: DC | PRN
  Administered 2017-11-13: 10 mg via ORAL

## 2017-11-12 MED ORDER — ACETAMINOPHEN 10 MG/ML IV SOLN
INTRAVENOUS | Status: AC
  Filled 2017-11-12: qty 100

## 2017-11-12 MED ORDER — MAGNESIUM OXIDE 400 (241.3 MG) MG PO TABS
800.0000 mg | ORAL_TABLET | Freq: Every day | ORAL | Status: DC
  Administered 2017-11-13: 400 mg via ORAL
  Administered 2017-11-14: 800 mg via ORAL
  Filled 2017-11-12 (×2): qty 2

## 2017-11-12 MED ORDER — ALBUTEROL SULFATE HFA 108 (90 BASE) MCG/ACT IN AERS
INHALATION_SPRAY | RESPIRATORY_TRACT | Status: DC | PRN
  Administered 2017-11-12 (×2): 2 via RESPIRATORY_TRACT

## 2017-11-12 MED ORDER — BUPIVACAINE HCL (PF) 0.5 % IJ SOLN
INTRAMUSCULAR | Status: AC
  Filled 2017-11-12: qty 10

## 2017-11-12 MED ORDER — RIVAROXABAN 20 MG PO TABS
20.0000 mg | ORAL_TABLET | Freq: Every day | ORAL | Status: DC
  Administered 2017-11-13: 20 mg via ORAL
  Filled 2017-11-12 (×2): qty 1

## 2017-11-12 MED ORDER — PAROXETINE HCL 20 MG PO TABS
20.0000 mg | ORAL_TABLET | Freq: Every day | ORAL | Status: DC
  Administered 2017-11-12 – 2017-11-13 (×2): 20 mg via ORAL
  Filled 2017-11-12 (×2): qty 1

## 2017-11-12 MED ORDER — CEFAZOLIN SODIUM-DEXTROSE 2-4 GM/100ML-% IV SOLN
2.0000 g | Freq: Four times a day (QID) | INTRAVENOUS | Status: AC
  Administered 2017-11-13 (×2): 2 g via INTRAVENOUS
  Filled 2017-11-12 (×4): qty 100

## 2017-11-12 MED ORDER — PROPOFOL 10 MG/ML IV BOLUS
INTRAVENOUS | Status: AC
  Filled 2017-11-12: qty 20

## 2017-11-12 MED ORDER — CYCLOBENZAPRINE HCL 10 MG PO TABS
10.0000 mg | ORAL_TABLET | Freq: Three times a day (TID) | ORAL | Status: DC | PRN
  Administered 2017-11-12 – 2017-11-13 (×2): 10 mg via ORAL
  Filled 2017-11-12 (×3): qty 1

## 2017-11-12 MED ORDER — NICOTINE 10 MG IN INHA: 10.0000 mg | RESPIRATORY_TRACT | Status: DC | PRN

## 2017-11-12 MED ORDER — TACROLIMUS 1 MG PO CAPS
1.0000 mg | ORAL_CAPSULE | Freq: Two times a day (BID) | ORAL | Status: DC
  Administered 2017-11-12 – 2017-11-14 (×4): 1 mg via ORAL
  Filled 2017-11-12 (×5): qty 1

## 2017-11-12 MED ORDER — METOCLOPRAMIDE HCL 5 MG/ML IJ SOLN: 5.0000 mg | Freq: Three times a day (TID) | INTRAMUSCULAR | Status: DC | PRN

## 2017-11-12 MED ORDER — DEXAMETHASONE SODIUM PHOSPHATE 10 MG/ML IJ SOLN
INTRAMUSCULAR | Status: AC
  Administered 2017-11-12: 8 mg via INTRAVENOUS
  Filled 2017-11-12: qty 1

## 2017-11-12 MED ORDER — IPRATROPIUM-ALBUTEROL 0.5-2.5 (3) MG/3ML IN SOLN
RESPIRATORY_TRACT | Status: AC
  Administered 2017-11-12: 3 mL
  Filled 2017-11-12: qty 3

## 2017-11-12 MED ORDER — DEXAMETHASONE SODIUM PHOSPHATE 10 MG/ML IJ SOLN
8.0000 mg | Freq: Once | INTRAMUSCULAR | Status: AC
  Administered 2017-11-12: 8 mg via INTRAVENOUS

## 2017-11-12 MED ORDER — MIDAZOLAM HCL 2 MG/2ML IJ SOLN
INTRAMUSCULAR | Status: AC
  Filled 2017-11-12: qty 2

## 2017-11-12 MED ORDER — SPIRONOLACTONE 25 MG PO TABS
25.0000 mg | ORAL_TABLET | Freq: Every day | ORAL | Status: DC
  Administered 2017-11-14: 25 mg via ORAL
  Filled 2017-11-12: qty 1

## 2017-11-12 MED ORDER — NORTRIPTYLINE HCL 25 MG PO CAPS
25.0000 mg | ORAL_CAPSULE | Freq: Every day | ORAL | Status: DC
  Administered 2017-11-12 – 2017-11-13 (×2): 25 mg via ORAL
  Filled 2017-11-12 (×3): qty 1

## 2017-11-12 MED ORDER — METOPROLOL SUCCINATE ER 50 MG PO TB24
50.0000 mg | ORAL_TABLET | Freq: Two times a day (BID) | ORAL | Status: DC
  Administered 2017-11-12 – 2017-11-14 (×4): 50 mg via ORAL
  Filled 2017-11-12 (×3): qty 1

## 2017-11-12 MED ORDER — NEOMYCIN-POLYMYXIN B GU 40-200000 IR SOLN
Status: DC | PRN
  Administered 2017-11-12: 14 mL

## 2017-11-12 MED ORDER — GABAPENTIN 300 MG PO CAPS
ORAL_CAPSULE | ORAL | Status: AC
  Administered 2017-11-12: 300 mg via ORAL
  Filled 2017-11-12: qty 1

## 2017-11-12 MED ORDER — INSULIN ASPART 100 UNIT/ML ~~LOC~~ SOLN
0.0000 [IU] | Freq: Three times a day (TID) | SUBCUTANEOUS | Status: DC
  Administered 2017-11-13 (×2): 8 [IU] via SUBCUTANEOUS
  Administered 2017-11-13: 3 [IU] via SUBCUTANEOUS
  Administered 2017-11-14: 2 [IU] via SUBCUTANEOUS
  Filled 2017-11-12 (×4): qty 1

## 2017-11-12 MED ORDER — MAGNESIUM HYDROXIDE 400 MG/5ML PO SUSP
30.0000 mL | Freq: Every day | ORAL | Status: DC
  Administered 2017-11-13: 30 mL via ORAL
  Filled 2017-11-12: qty 30

## 2017-11-12 MED ORDER — SODIUM CHLORIDE 0.9 % IV SOLN
INTRAVENOUS | Status: DC
  Administered 2017-11-12 (×3): via INTRAVENOUS

## 2017-11-12 MED ORDER — TRANEXAMIC ACID-NACL 1000-0.7 MG/100ML-% IV SOLN
1000.0000 mg | Freq: Once | INTRAVENOUS | Status: AC
  Administered 2017-11-12: 1000 mg via INTRAVENOUS
  Filled 2017-11-12: qty 100

## 2017-11-12 MED ORDER — ALBUTEROL SULFATE HFA 108 (90 BASE) MCG/ACT IN AERS
INHALATION_SPRAY | RESPIRATORY_TRACT | Status: AC
  Filled 2017-11-12: qty 6.7

## 2017-11-12 MED ORDER — CEFAZOLIN SODIUM-DEXTROSE 2-3 GM-%(50ML) IV SOLR
INTRAVENOUS | Status: DC | PRN
  Administered 2017-11-12: 2 g via INTRAVENOUS

## 2017-11-12 MED ORDER — SODIUM CHLORIDE FLUSH 0.9 % IV SOLN
INTRAVENOUS | Status: AC
  Filled 2017-11-12: qty 10

## 2017-11-12 MED ORDER — IPRATROPIUM-ALBUTEROL 0.5-2.5 (3) MG/3ML IN SOLN: 3.0000 mL | Freq: Once | RESPIRATORY_TRACT | Status: DC

## 2017-11-12 MED ORDER — OXYCODONE HCL 5 MG PO TABS
10.0000 mg | ORAL_TABLET | ORAL | Status: DC | PRN
  Administered 2017-11-13 – 2017-11-14 (×5): 10 mg via ORAL
  Filled 2017-11-12 (×7): qty 2

## 2017-11-12 MED ORDER — FLEET ENEMA 7-19 GM/118ML RE ENEM: 1.0000 | ENEMA | Freq: Once | RECTAL | Status: DC | PRN

## 2017-11-12 MED ORDER — ALUM & MAG HYDROXIDE-SIMETH 200-200-20 MG/5ML PO SUSP: 30.0000 mL | ORAL | Status: DC | PRN

## 2017-11-12 MED ORDER — TRAMADOL HCL 50 MG PO TABS
50.0000 mg | ORAL_TABLET | ORAL | Status: DC | PRN
  Administered 2017-11-12 – 2017-11-14 (×3): 100 mg via ORAL
  Filled 2017-11-12 (×3): qty 2

## 2017-11-12 MED ORDER — HYDROMORPHONE HCL 1 MG/ML IJ SOLN
0.5000 mg | INTRAMUSCULAR | Status: DC | PRN
  Administered 2017-11-13: 1 mg via INTRAVENOUS
  Filled 2017-11-12: qty 1

## 2017-11-12 MED ORDER — TETRACAINE HCL 1 % IJ SOLN
INTRAMUSCULAR | Status: DC | PRN
  Administered 2017-11-12: 8 mg via INTRASPINAL

## 2017-11-12 MED ORDER — METOCLOPRAMIDE HCL 10 MG PO TABS
10.0000 mg | ORAL_TABLET | Freq: Three times a day (TID) | ORAL | Status: DC
  Administered 2017-11-12 – 2017-11-14 (×5): 10 mg via ORAL
  Filled 2017-11-12 (×6): qty 1

## 2017-11-12 MED ORDER — PHENOL 1.4 % MT LIQD
1.0000 | OROMUCOSAL | Status: DC | PRN
  Filled 2017-11-12: qty 177

## 2017-11-12 MED ORDER — SENNOSIDES-DOCUSATE SODIUM 8.6-50 MG PO TABS
1.0000 | ORAL_TABLET | Freq: Two times a day (BID) | ORAL | Status: DC
  Administered 2017-11-12 – 2017-11-13 (×3): 1 via ORAL
  Filled 2017-11-12 (×3): qty 1

## 2017-11-12 MED ORDER — CHLORHEXIDINE GLUCONATE 4 % EX LIQD: 60.0000 mL | Freq: Once | CUTANEOUS | Status: DC

## 2017-11-12 MED ORDER — BUPIVACAINE HCL (PF) 0.5 % IJ SOLN
INTRAMUSCULAR | Status: DC | PRN
  Administered 2017-11-12: 2.2 mL via INTRATHECAL

## 2017-11-12 MED ORDER — PANTOPRAZOLE SODIUM 40 MG PO TBEC
40.0000 mg | DELAYED_RELEASE_TABLET | Freq: Two times a day (BID) | ORAL | Status: DC
  Administered 2017-11-12 – 2017-11-14 (×4): 40 mg via ORAL
  Filled 2017-11-12 (×5): qty 1

## 2017-11-12 SURGICAL SUPPLY — 69 items
ATTUNE PS FEM LT SZ 6 CEM KNEE (Femur) ×3 IMPLANT
ATTUNE PSRP INSR SZ6 5 KNEE (Insert) ×2 IMPLANT
ATTUNE PSRP INSR SZ6 5MM KNEE (Insert) ×1 IMPLANT
BASE TIBIAL ROT PLAT SZ 7 KNEE (Knees) ×1 IMPLANT
BATTERY INSTRU NAVIGATION (MISCELLANEOUS) ×12 IMPLANT
BLADE SAW 70X12.5 (BLADE) ×3 IMPLANT
BLADE SAW 90X13X1.19 OSCILLAT (BLADE) ×3 IMPLANT
BLADE SAW 90X25X1.19 OSCILLAT (BLADE) ×3 IMPLANT
BONE CEMENT GENTAMICIN (Cement) ×6 IMPLANT
CANISTER SUCT 1200ML W/VALVE (MISCELLANEOUS) ×3 IMPLANT
CANISTER SUCT 3000ML PPV (MISCELLANEOUS) ×6 IMPLANT
CEMENT BONE GENTAMICIN 40 (Cement) ×2 IMPLANT
COOLER POLAR GLACIER W/PUMP (MISCELLANEOUS) ×3 IMPLANT
COVER WAND RF STERILE (DRAPES) ×3 IMPLANT
CUFF TOURN 24 STER (MISCELLANEOUS) IMPLANT
CUFF TOURN 30 STER DUAL PORT (MISCELLANEOUS) IMPLANT
DRAPE SHEET LG 3/4 BI-LAMINATE (DRAPES) ×3 IMPLANT
DRSG DERMACEA 8X12 NADH (GAUZE/BANDAGES/DRESSINGS) ×3 IMPLANT
DRSG OPSITE POSTOP 4X14 (GAUZE/BANDAGES/DRESSINGS) ×3 IMPLANT
DRSG TEGADERM 4X4.75 (GAUZE/BANDAGES/DRESSINGS) ×3 IMPLANT
DURAPREP 26ML APPLICATOR (WOUND CARE) ×6 IMPLANT
ELECT CAUTERY BLADE 6.4 (BLADE) ×3 IMPLANT
ELECT REM PT RETURN 9FT ADLT (ELECTROSURGICAL) ×3
ELECTRODE REM PT RTRN 9FT ADLT (ELECTROSURGICAL) ×1 IMPLANT
EX-PIN ORTHOLOCK NAV 4X150 (PIN) ×6 IMPLANT
GLOVE BIOGEL M STRL SZ7.5 (GLOVE) ×6 IMPLANT
GLOVE BIOGEL PI IND STRL 9 (GLOVE) ×1 IMPLANT
GLOVE BIOGEL PI INDICATOR 9 (GLOVE) ×2
GLOVE INDICATOR 8.0 STRL GRN (GLOVE) ×3 IMPLANT
GLOVE SURG SYN 9.0  PF PI (GLOVE) ×2
GLOVE SURG SYN 9.0 PF PI (GLOVE) ×1 IMPLANT
GOWN STRL REUS W/ TWL LRG LVL3 (GOWN DISPOSABLE) ×2 IMPLANT
GOWN STRL REUS W/TWL 2XL LVL3 (GOWN DISPOSABLE) ×3 IMPLANT
GOWN STRL REUS W/TWL LRG LVL3 (GOWN DISPOSABLE) ×4
HEMOVAC 400CC 10FR (MISCELLANEOUS) ×3 IMPLANT
HOLDER FOLEY CATH W/STRAP (MISCELLANEOUS) ×3 IMPLANT
HOOD PEEL AWAY FLYTE STAYCOOL (MISCELLANEOUS) ×6 IMPLANT
KIT TURNOVER KIT A (KITS) ×3 IMPLANT
KNIFE SCULPS 14X20 (INSTRUMENTS) ×3 IMPLANT
LABEL OR SOLS (LABEL) ×3 IMPLANT
NDL SAFETY ECLIPSE 18X1.5 (NEEDLE) ×1 IMPLANT
NEEDLE HYPO 18GX1.5 SHARP (NEEDLE) ×2
NEEDLE SPNL 20GX3.5 QUINCKE YW (NEEDLE) ×6 IMPLANT
NS IRRIG 500ML POUR BTL (IV SOLUTION) ×3 IMPLANT
PACK TOTAL KNEE (MISCELLANEOUS) ×3 IMPLANT
PAD WRAPON POLAR KNEE (MISCELLANEOUS) ×1 IMPLANT
PATELLA MEDIAL ATTUN 35MM KNEE (Knees) ×3 IMPLANT
PIN FIXATION 1/8DIA X 3INL (PIN) ×9 IMPLANT
PULSAVAC PLUS IRRIG FAN TIP (DISPOSABLE) ×3
SOL .9 NS 3000ML IRR  AL (IV SOLUTION) ×2
SOL .9 NS 3000ML IRR UROMATIC (IV SOLUTION) ×1 IMPLANT
SOL PREP PVP 2OZ (MISCELLANEOUS) ×3
SOLUTION PREP PVP 2OZ (MISCELLANEOUS) ×1 IMPLANT
SPONGE DRAIN TRACH 4X4 STRL 2S (GAUZE/BANDAGES/DRESSINGS) ×3 IMPLANT
STAPLER SKIN PROX 35W (STAPLE) ×3 IMPLANT
STRAP TIBIA SHORT (MISCELLANEOUS) ×3 IMPLANT
SUCTION FRAZIER HANDLE 10FR (MISCELLANEOUS) ×2
SUCTION TUBE FRAZIER 10FR DISP (MISCELLANEOUS) ×1 IMPLANT
SUT VIC AB 0 CT1 36 (SUTURE) ×3 IMPLANT
SUT VIC AB 1 CT1 36 (SUTURE) ×6 IMPLANT
SUT VIC AB 2-0 CT2 27 (SUTURE) ×3 IMPLANT
SYR 20CC LL (SYRINGE) ×3 IMPLANT
SYR 30ML LL (SYRINGE) ×6 IMPLANT
TIBIAL BASE ROT PLAT SZ 7 KNEE (Knees) ×3 IMPLANT
TIP FAN IRRIG PULSAVAC PLUS (DISPOSABLE) ×1 IMPLANT
TOWEL OR 17X26 4PK STRL BLUE (TOWEL DISPOSABLE) ×3 IMPLANT
TOWER CARTRIDGE SMART MIX (DISPOSABLE) ×3 IMPLANT
TRAY FOLEY MTR SLVR 16FR STAT (SET/KITS/TRAYS/PACK) ×3 IMPLANT
WRAPON POLAR PAD KNEE (MISCELLANEOUS) ×3

## 2017-11-12 NOTE — Transfer of Care (Signed)
Immediate Anesthesia Transfer of Care Note  Patient: Billy Hughes  Procedure(s) Performed: COMPUTER ASSISTED TOTAL KNEE ARTHROPLASTY (Left Knee)  Patient Location: PACU  Anesthesia Type:Spinal  Level of Consciousness: awake  Airway & Oxygen Therapy: Patient Spontanous Breathing and Patient connected to face mask oxygen  Post-op Assessment: Report given to RN and Post -op Vital signs reviewed and stable  Post vital signs: Reviewed  Last Vitals:  Vitals Value Taken Time  BP 112/69 11/12/2017  2:50 PM  Temp    Pulse 76 11/12/2017  2:50 PM  Resp 21 11/12/2017  2:50 PM  SpO2 92 % 11/12/2017  2:50 PM  Vitals shown include unvalidated device data.  Last Pain:  Vitals:   11/12/17 0949  TempSrc: Oral  PainSc: 1          Complications: No apparent anesthesia complications

## 2017-11-12 NOTE — Anesthesia Post-op Follow-up Note (Signed)
Anesthesia QCDR form completed.        

## 2017-11-12 NOTE — Anesthesia Preprocedure Evaluation (Addendum)
Anesthesia Evaluation  Patient identified by MRN, date of birth, ID band Patient awake    Reviewed: Allergy & Precautions, H&P , NPO status , Patient's Chart, lab work & pertinent test results  Airway Mallampati: III  TM Distance: >3 FB Neck ROM: full    Dental  (+) Missing, Chipped, Poor Dentition   Pulmonary neg pulmonary ROS, neg sleep apnea, neg COPD, Current Smoker,           Cardiovascular hypertension, (-) angina+ DVT (on Xarelto)  (-) Past MI, (-) Cardiac Stents and (-) CABG  Rhythm:regular Rate:Normal     Neuro/Psych PSYCHIATRIC DISORDERS Anxiety Depression Dementia negative neurological ROS  negative psych ROS   GI/Hepatic negative GI ROS, Neg liver ROS, PUD, GERD  ,  Endo/Other  negative endocrine ROSdiabetes  Renal/GU Renal disease (s/p renal transplant)     Musculoskeletal  (+) Arthritis ,   Abdominal   Peds  Hematology negative hematology ROS (+) Blood dyscrasia, anemia ,   Anesthesia Other Findings Past Medical History: No date: Anemia No date: Anxiety No date: Cancer [facility])     Comment:  skin No date: Diabetes mellitus without complication (HCC) No date: Diverticulitis No date: Gastric ulcer No date: GERD (gastroesophageal reflux disease) No date: Hypertension No date: Renal disorder  Past Surgical History: No date: COLON RESECTION No date: COLON SURGERY No date: DIALYSIS FISTULA CREATION 10/27/2017: IVC FILTER INSERTION; Left     Comment:  Procedure: IVC FILTER INSERTION;  Surgeon: Algernon Huxley,              MD;  Location: [facility];  Service:               Cardiovascular;  Laterality: Left; No date: KIDNEY TRANSPLANT No date: NASAL RECONSTRUCTION  BMI    Body Mass Index:  37.84 kg/m      Reproductive/Obstetrics negative OB ROS                            Anesthesia Physical Anesthesia Plan  ASA: III  Anesthesia Plan: Spinal   Post-op Pain  Management:    Induction: Intravenous  PONV Risk Score and Plan: Propofol infusion  Airway Management Planned: Natural Airway  Additional Equipment:   Intra-op Plan:   Post-operative Plan:   Informed Consent: I have reviewed the patients History and Physical, chart, labs and discussed the procedure including the risks, benefits and alternatives for the proposed anesthesia with the patient or authorized representative who has indicated his/her understanding and acceptance.   Dental Advisory Given  Plan Discussed with: Anesthesiologist, CRNA and Surgeon  Anesthesia Plan Comments:         Anesthesia Quick Evaluation

## 2017-11-12 NOTE — Progress Notes (Signed)
Remove foley once feeling has returned per [employer]

## 2017-11-12 NOTE — Op Note (Signed)
OPERATIVE NOTE  DATE OF SURGERY:  11/12/2017  PATIENT NAME:  Billy Hughes   DOB: Aug 11, 1965  MRN: [id number]  PRE-OPERATIVE DIAGNOSIS: Degenerative arthrosis of the left knee, primary  POST-OPERATIVE DIAGNOSIS:  Same  PROCEDURE:  Left total knee arthroplasty using computer-assisted navigation  SURGEON:  Marciano Sequin. M.D.  ASSISTANT:  Vance Peper, PA (present and scrubbed throughout the case, critical for assistance with exposure, retraction, instrumentation, and closure)  ANESTHESIA: spinal  ESTIMATED BLOOD LOSS: 50 mL  FLUIDS REPLACED: 1000 mL of crystalloid  TOURNIQUET TIME: 107 minutes  DRAINS: 2 medium Hemovac drains  SOFT TISSUE RELEASES: Anterior cruciate ligament, posterior cruciate ligament, deep medial collateral ligament, patellofemoral ligament  IMPLANTS UTILIZED: DePuy Attune size 6 posterior stabilized femoral component (cemented), size 7 rotating platform tibial component (cemented), 35 mm medialized dome patella (cemented), and a 5 mm stabilized rotating platform polyethylene insert.  INDICATIONS FOR SURGERY: Billy Hughes is a 52 y.o. year old male with a long history of progressive knee pain. X-rays demonstrated severe degenerative changes in tricompartmental fashion. The patient had not seen any significant improvement despite conservative nonsurgical intervention. After discussion of the risks and benefits of surgical intervention, the patient expressed understanding of the risks benefits and agree with plans for total knee arthroplasty.   The risks, benefits, and alternatives were discussed at length including but not limited to the risks of infection, bleeding, nerve injury, stiffness, blood clots, the need for revision surgery, cardiopulmonary complications, among others, and they were willing to proceed.  PROCEDURE IN DETAIL: The patient was brought into the operating room and, after adequate spinal anesthesia was achieved, a tourniquet was  placed on the patient's upper thigh. The patient's knee and leg were cleaned and prepped with alcohol and DuraPrep and draped in the usual sterile fashion. A "timeout" was performed as per usual protocol. The lower extremity was exsanguinated using an Esmarch, and the tourniquet was inflated to 300 mmHg. An anterior longitudinal incision was made followed by a standard mid vastus approach. The deep fibers of the medial collateral ligament were elevated in a subperiosteal fashion off of the medial flare of the tibia so as to maintain a continuous soft tissue sleeve. The patella was subluxed laterally and the patellofemoral ligament was incised. Inspection of the knee demonstrated severe degenerative changes with full-thickness loss of articular cartilage. Osteophytes were debrided using a rongeur. Anterior and posterior cruciate ligaments were excised. Two 4.0 mm Schanz pins were inserted in the femur and into the tibia for attachment of the array of trackers used for computer-assisted navigation. Hip center was identified using a circumduction technique. Distal landmarks were mapped using the computer. The distal femur and proximal tibia were mapped using the computer. The distal femoral cutting guide was positioned using computer-assisted navigation so as to achieve a 5 distal valgus cut. The femur was sized and it was felt that a size 6 femoral component was appropriate. A size 6 femoral cutting guide was positioned and the anterior cut was performed and verified using the computer. This was followed by completion of the posterior and chamfer cuts. Femoral cutting guide for the central box was then positioned in the center box cut was performed.  Attention was then directed to the proximal tibia. Medial and lateral menisci were excised. The extramedullary tibial cutting guide was positioned using computer-assisted navigation so as to achieve a 0 varus-valgus alignment and 3 posterior slope. The cut was  performed and verified using the computer. The proximal tibia  was sized and it was felt that a size 7 tibial tray was appropriate. Tibial and femoral trials were inserted followed by insertion of a 5 mm polyethylene insert. This allowed for excellent mediolateral soft tissue balancing both in flexion and in full extension. Finally, the patella was cut and prepared so as to accommodate a 35 mm medialized dome patella. A patella trial was placed and the knee was placed through a range of motion with excellent patellar tracking appreciated. The femoral trial was removed after debridement of posterior osteophytes. The central post-hole for the tibial component was reamed followed by insertion of a keel punch. Tibial trials were then removed. Cut surfaces of bone were irrigated with copious amounts of normal saline with antibiotic solution using pulsatile lavage and then suctioned dry. Polymethylmethacrylate cement with gentamicin was prepared in the usual fashion using a vacuum mixer. Cement was applied to the cut surface of the proximal tibia as well as along the undersurface of a size 7 rotating platform tibial component. Tibial component was positioned and impacted into place. Excess cement was removed using [employer] fast streamer. Cement was then applied to the cut surfaces of the femur as well as along the posterior flanges of the size 6 femoral component. The femoral component was positioned and impacted into place. Excess cement was removed using [employer] fast streamer. A 5 mm polyethylene trial was inserted and the knee was brought into full extension with steady axial compression applied. Finally, cement was applied to the backside of a 35 mm medialized dome patella and the patellar component was positioned and patellar clamp applied. Excess cement was removed using [employer] fast streamer. After adequate curing of the cement, the tourniquet was deflated after a total tourniquet time of 107 minutes. Hemostasis was achieved using  electrocautery. The knee was irrigated with copious amounts of normal saline with antibiotic solution using pulsatile lavage and then suctioned dry. 20 mL of 1.3% Exparel and 60 mL of 0.25% Marcaine in 40 mL of normal saline was injected along the posterior capsule, medial and lateral gutters, and along the arthrotomy site. A 5 mm stabilized rotating platform polyethylene insert was inserted and the knee was placed through a range of motion with excellent mediolateral soft tissue balancing appreciated and excellent patellar tracking noted. 2 medium drains were placed in the wound bed and brought out through separate stab incisions. The medial parapatellar portion of the incision was reapproximated using interrupted sutures of #1 Vicryl. Subcutaneous tissue was approximated in layers using first #0 Vicryl followed #2-0 Vicryl. The skin was approximated with skin staples. A sterile dressing was applied.  The patient tolerated the procedure well and was transported to the recovery room in stable condition.    Darnelle Derrick P. Holley Bouche., M.D.

## 2017-11-12 NOTE — Anesthesia Procedure Notes (Signed)
Procedure Name: MAC Date/Time: 11/12/2017 11:28 AM Performed by: Rolla Plate, CRNA Pre-anesthesia Checklist: Patient identified, Emergency Drugs available, Suction available, Patient being monitored and Timeout performed Oxygen Delivery Method: Simple face mask

## 2017-11-12 NOTE — H&P (Signed)
The patient has been re-examined, and the chart reviewed, and there have been no interval changes to the documented history and physical.    The risks, benefits, and alternatives have been discussed at length. The patient expressed understanding of the risks benefits and agreed with plans for surgical intervention.  James P. Hooten, Jr. M.D.    

## 2017-11-12 NOTE — Anesthesia Procedure Notes (Signed)
Spinal  Patient location during procedure: OR Staffing Anesthesiologist: Durenda Hurt, MD Resident/CRNA: Rolla Plate, CRNA Other anesthesia staff: Foxworth, Richard Carlean Jews, RN Performed: other anesthesia staff  Preanesthetic Checklist Completed: patient identified, site marked, surgical consent, pre-op evaluation, timeout performed, IV checked, risks and benefits discussed and monitors and equipment checked Spinal Block Patient position: sitting Prep: ChloraPrep and site prepped and draped Patient monitoring: heart rate, continuous pulse ox, blood pressure and cardiac monitor Approach: midline Location: L4-5 Injection technique: single-shot Needle Needle type: Introducer and Pencan  Needle gauge: 24 G Needle length: 9 cm Additional Notes Negative paresthesia. Negative blood return. Positive free-flowing CSF. Expiration date of kit checked and confirmed. Patient tolerated procedure well, without complications.

## 2017-11-13 ENCOUNTER — Encounter: Payer: Self-pay | Admitting: Orthopedic Surgery

## 2017-11-13 LAB — BASIC METABOLIC PANEL
Anion gap: 8 (ref 5–15)
BUN: 22 mg/dL — ABNORMAL HIGH (ref 6–20)
CO2: 21 mmol/L — ABNORMAL LOW (ref 22–32)
CREATININE: 1.76 mg/dL — AB (ref 0.61–1.24)
Calcium: 8.9 mg/dL (ref 8.9–10.3)
Chloride: 97 mmol/L — ABNORMAL LOW (ref 98–111)
GFR calc Af Amer: 49 mL/min — ABNORMAL LOW (ref >60)
GFR, EST NON AFRICAN AMERICAN: 42 mL/min — AB (ref >60)
Glucose, Bld: 326 mg/dL — ABNORMAL HIGH (ref 70–99)
Potassium: 5.1 mmol/L (ref 3.5–5.1)
SODIUM: 126 mmol/L — AB (ref 135–145)

## 2017-11-13 LAB — GLUCOSE, CAPILLARY
GLUCOSE-CAPILLARY: 258 mg/dL — AB (ref 70–99)
GLUCOSE-CAPILLARY: 258 mg/dL — AB (ref 70–99)
GLUCOSE-CAPILLARY: 293 mg/dL — AB (ref 70–99)
Glucose-Capillary: 186 mg/dL — ABNORMAL HIGH (ref 70–99)

## 2017-11-13 MED ORDER — TRAMADOL HCL 50 MG PO TABS: 50.0000 mg | ORAL_TABLET | ORAL | 0 refills | Status: DC | PRN

## 2017-11-13 MED ORDER — OXYCODONE HCL 10 MG PO TABS: 10.0000 mg | ORAL_TABLET | ORAL | 0 refills | Status: DC | PRN

## 2017-11-13 NOTE — Progress Notes (Addendum)
   Subjective: 1 Day Post-Op Procedure(s) (LRB): COMPUTER ASSISTED TOTAL KNEE ARTHROPLASTY (Left) Patient reports pain as mild.   Patient is well, and has had no acute complaints or problems We will start therapy today.  Patient sitting at the edge of the bed this morning.  States she is done is quite a few times during the night. Plan is to go Home after hospital stay. no nausea and no vomiting Patient denies any chest pains or shortness of breath. Patient very pleased with results.  Resting well.  No complaints  Objective: Vital signs in last 24 hours: Temp:  [97.5 F (36.4 C)-98.3 F (36.8 C)] 97.7 F (36.5 C) (10/31 0508) Pulse Rate:  [67-87] 82 (10/31 0508) Resp:  [10-20] 18 (10/31 0508) BP: (105-144)/(63-81) 143/75 (10/31 0508) SpO2:  [90 %-97 %] 91 % (10/31 0508) Weight:  [126.6 kg-131.8 kg] 131.8 kg (10/30 1711) Heels are non tender and elevated off the bed using rolled towels Intake/Output from previous day: 10/30 0701 - 10/31 0700 In: 1770.3 [P.O.:240; I.V.:1200.3; IV Piggyback:300] Out: 1150 [Urine:1100; Blood:50] Intake/Output this shift: No intake/output data recorded.  No results for input(s): HGB in the last 72 hours. No results for input(s): WBC, RBC, HCT, PLT in the last 72 hours. Recent Labs    11/13/17 0605  NA 126*  K 5.1  CL 97*  CO2 21*  BUN 22*  CREATININE 1.76*  GLUCOSE 326*  CALCIUM 8.9   Recent Labs    11/12/17 1006  INR 0.96    EXAM General - Patient is Alert, Appropriate and Oriented Extremity - Neurologically intact Neurovascular intact Sensation intact distally Intact pulses distally Dorsiflexion/Plantar flexion intact No cellulitis present Compartment soft Dressing - dressing C/D/I Motor Function - intact, moving foot and toes well on exam.  Moving leg well.  Able to do straight leg raise on his own  Past Medical History:  Diagnosis Date  . Anemia   . Anxiety   . Cancer ([place])    skin  . Diabetes mellitus without  complication ([place])   . Diverticulitis   . Gastric ulcer   . GERD (gastroesophageal reflux disease)   . Hypertension   . Renal disorder     Assessment/Plan: 1 Day Post-Op Procedure(s) (LRB): COMPUTER ASSISTED TOTAL KNEE ARTHROPLASTY (Left) Active Problems:   S/P total knee arthroplasty  Estimated body mass index is 39.41 kg/m as calculated from the following:   Height as of this encounter: 6' (1.829 m).   Weight as of this encounter: 131.8 kg. Advance diet Up with therapy D/C IV fluids Plan for discharge tomorrow Discharge home with home health  Labs: Sodium 126, potassium 5.1, BUN 22 with creatinine 1.76 which are up slightly from his norm. DVT Prophylaxis - Xarelto, Foot Pumps and TED hose Weight-Bearing as tolerated to left leg D/C O2 and Pulse OX and try on Room Air Again working on bowel movement Labs tomorrow morning.  Possibly some early dehydration. Will contact his nephrologist in regards to the labs and treatment plan  Dontavian Marchi R. [place] 11/13/2017, 7:45 AM

## 2017-11-13 NOTE — Progress Notes (Signed)
Physical Therapy Treatment Patient Details Name: Billy Hughes MRN: [id number] DOB: [birth year] Today's Date: 11/13/2017    History of Present Illness 52 y/o male s/p L TKA 11/12/17.      PT Comments    Pt eager to work with PT but is feeling somewhat tired; O2 off on arrival, sats in the 80s - reapplied 2.5 liters with sats increasing to the low 90s.  Pt continued to show very good bed mobility, no need for physical assist with transfers and was able to ambulate ~100 ft and negotiate up/down 4 steps.  Pt was not overly reliant on the walker, but did have some hesitation with WBing on the L during standing acts.  Overall pt did well, has surpassed typical POD1 goals and is hoping to continue improving and go home tomorrow if able.    Follow Up Recommendations  Home health PT     Equipment Recommendations  Rolling walker with 5" wheels    Recommendations for Other Services       Precautions / Restrictions Precautions Precautions: Fall;Knee Restrictions Weight Bearing Restrictions: Yes LLE Weight Bearing: Weight bearing as tolerated    Mobility  Bed Mobility Overal bed mobility: Independent             General bed mobility comments: Easily transitions to either side of bed w/o assist  Transfers Overall transfer level: Needs assistance Equipment used: Rolling walker (2 wheeled) Transfers: Sit to/from Stand Sit to Stand: Supervision         General transfer comment: Minimal cuing to remind about hand placement and extending L knee to get back to sitting, but no direct assist required  Ambulation/Gait Ambulation/Gait assistance: Min guard Gait Distance (Feet): 100 Feet Assistive device: Rolling walker (2 wheeled)       General Gait Details: Pt with slow but consistent cadence w/o heavy reliance on the walker and good overall confidence.  No knee buckling, but a few steps where initial WBing through L caused some hesitancy    Stairs Stairs: Yes Stairs  assistance: Supervision Stair Management: Two rails Number of Stairs: 4 General stair comments: Pt able to negotiate up/down 4 steps w/o phyiscal assist.  Reliant on the rails, but safe and efficient.   Wheelchair Mobility    Modified Rankin (Stroke Patients Only)       Balance Overall balance assessment: Modified Independent Sitting-balance support: Feet supported;No upper extremity supported Sitting balance-Leahy Scale: Good       Standing balance-Leahy Scale: Good                              Cognition Arousal/Alertness: Awake/alert Behavior During Therapy: WFL for tasks assessed/performed Overall Cognitive Status: Within Functional Limits for tasks assessed                                        Exercises Total Joint Exercises Ankle Circles/Pumps: AROM;10 reps Quad Sets: Strengthening;15 reps Short Arc Quad: Strengthening;10 reps Heel Slides: AROM;10 reps(resisted leg extension) Hip ABduction/ADduction: Strengthening;10 reps Straight Leg Raises: Strengthening;10 reps Knee Flexion: PROM;5 reps Other Exercises Other Exercises: Pt/spouse educated in polar care manegement and compression sock mgt including donning/doffing, wear schedule, and positioning.  Other Exercises: Pt/spouse educated in use of AE for LB dressing to increase independence and safety. Other Exercises: Pt/spouse educated in falls prevention strategies to increase safety in the  home.  Other Exercises: Pt/spouse educated in pet care mgt to increase safety upon returning home.    General Comments General comments (skin integrity, edema, etc.): Pt on room air on arrival, O2 sats in the low 80s, able to achieve as high as 88% on room air with purposeful breathing and guidance. Only achieved low 90s on 2-3 liters.       Pertinent Vitals/Pain Pain Assessment: 0-10 Pain Score: 4  Pain Location: increased pain with ROM Pain Descriptors / Indicators: Sharp;Aching Pain  Intervention(s): Limited activity within patient's tolerance;Repositioned;Ice applied;Monitored during session;Premedicated before session    Home Living Family/patient expects to be discharged to:: Private residence Living Arrangements: Spouse/significant other Available Help at Discharge: Family;Available 24 hours/day Type of Home: House Home Access: Stairs to enter Entrance Stairs-Rails: Can reach both Home Layout: Two level Home Equipment: None      Prior Function Level of Independence: Independent      Comments: Pt has been limited due to chronic knee pain (and inability to take anti-inflammatories). 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    PT Goals (current goals can now be found in the care plan section) Acute Rehab PT Goals Patient Stated Goal: to get back to work and travel with spouse Progress towards PT goals: Progressing toward goals    Frequency    BID      PT Plan Current plan remains appropriate    Co-evaluation              AM-PAC PT "6 Clicks" Daily Activity  Outcome Measure  Difficulty turning over in bed (including adjusting bedclothes, sheets and blankets)?: None Difficulty moving from lying on back to sitting on the side of the bed? : None Difficulty sitting down on and standing up from a chair with arms (e.g., wheelchair, bedside commode, etc,.)?: None Help needed moving to and from a bed to chair (including a wheelchair)?: None Help needed walking in hospital room?: None Help needed climbing 3-5 steps with a railing? : None 6 Click Score: 24    End of Session Equipment Utilized During Treatment: Gait belt;Oxygen Activity Tolerance: Patient tolerated treatment well Patient left: with chair alarm set;with call bell/phone within reach Nurse Communication: Mobility status(O2 situation) PT Visit Diagnosis: Muscle weakness (generalized)  (M62.81);Difficulty in walking, not elsewhere classified (R26.2);Pain Pain - Right/Left: Right Pain - part of body: Knee     Time: 8341-9622 PT Time Calculation (min) (ACUTE ONLY): 47 min  Charges:  $Gait Training: 8-22 mins $Therapeutic Exercise: 8-22 mins                     Kreg Shropshire, DPT 11/13/2017, 3:26 PM

## 2017-11-13 NOTE — Care Management Note (Signed)
Case Management Note  Patient Details  Name: Billy Hughes MRN: [id number] Date of Birth: 01/24/1965  Subjective/Objective:  Met with patient and his wife at bedside to discuss discharge planning. They prefer to use [facility] for home health PT, they have used them in the past. They are requesting a walker and a hospital bed since the patients bedroom is on the 2nd floor 16 steps up.  Ordered from [facility] with [facility]. He is on Xarelto.  It is anticipated patient will be discharged home tomorrow.                   Action/Plan: [facility] for HHPT and DME.   Expected Discharge Date:  11/14/17               Expected Discharge Plan:  [place]  In-House Referral:     Discharge planning Services  CM Consult  Post Acute Care Choice:  Durable Medical Equipment, Home Health Choice offered to:  Patient, Spouse  DME Arranged:  Hospital bed, Walker rolling DME Agency:  [place]:  PT [facility] Agency:  [place]  Status of Service:  In process, will continue to follow  If discussed at Long Length of Stay Meetings, dates discussed:    Additional Comments:  Jolly Mango, RN 11/13/2017, 4:08 PM

## 2017-11-13 NOTE — Discharge Summary (Signed)
Physician Discharge Summary  Patient ID: Billy Hughes MRN: [id number] DOB/AGE: Oct 10, 1965 51 y.o.  Admit date: 11/12/2017 Discharge date: 11/14/2017  Admission Diagnoses:  PRIMARY OSTEOARTHRITIS OF LEFT KNEE   Discharge Diagnoses: Patient Active Problem List   Diagnosis Date Noted  . S/P total knee arthroplasty 11/12/2017  . Primary osteoarthritis of left knee 05/30/2017  . Hypertension 02/28/2017  . Renal disorder 02/28/2017  . DVT (deep venous thrombosis) ([place]) 02/28/2017  . GERD without esophagitis 02/10/2017  . Hyperlipidemia 02/10/2017  . Recurrent major depressive disorder, in partial remission ([place]) 02/10/2017  . Post concussion syndrome 03/05/2016  . Kidney transplant status 11/05/2011    Past Medical History:  Diagnosis Date  . Anemia   . Anxiety   . Cancer ([place])    skin  . Diabetes mellitus without complication ([place])   . Diverticulitis   . Gastric ulcer   . GERD (gastroesophageal reflux disease)   . Hypertension   . Renal disorder      Transfusion: No transfusions during this admission   Consultants (if any):   Discharged Condition: Improved  Hospital Course: Billy Hughes is an 52 y.o. male who was admitted 11/12/2017 with a diagnosis of degenerative arthrosis left knee and went to the operating room on 11/12/2017 and underwent the above named procedures.    Surgeries:Procedure(s): COMPUTER ASSISTED TOTAL KNEE ARTHROPLASTY on 11/12/2017  PRE-OPERATIVE DIAGNOSIS: Degenerative arthrosis of the left knee, primary  POST-OPERATIVE DIAGNOSIS:  Same  PROCEDURE:  Left total knee arthroplasty using computer-assisted navigation  SURGEON:  Marciano Sequin. M.D.  ASSISTANT:  Vance Peper, PA (present and scrubbed throughout the case, critical for assistance with exposure, retraction, instrumentation, and closure)  ANESTHESIA: spinal  ESTIMATED BLOOD LOSS: 50 mL  FLUIDS REPLACED: 1000 mL of crystalloid  TOURNIQUET TIME: 107  minutes  DRAINS: 2 medium Hemovac drains  SOFT TISSUE RELEASES: Anterior cruciate ligament, posterior cruciate ligament, deep medial collateral ligament, patellofemoral ligament  IMPLANTS UTILIZED: DePuy Attune size 6 posterior stabilized femoral component (cemented), size 7 rotating platform tibial component (cemented), 35 mm medialized dome patella (cemented), and a 5 mm stabilized rotating platform polyethylene insert.  INDICATIONS FOR SURGERY: Billy Hughes is a 52 y.o. year old male with a long history of progressive knee pain. X-rays demonstrated severe degenerative changes in tricompartmental fashion. The patient had not seen any significant improvement despite conservative nonsurgical intervention. After discussion of the risks and benefits of surgical intervention, the patient expressed understanding of the risks benefits and agree with plans for total knee arthroplasty.   The risks, benefits, and alternatives were discussed at length including but not limited to the risks of infection, bleeding, nerve injury, stiffness, blood clots, the need for revision surgery, cardiopulmonary complications, among others, and they were willing to proceed. Patient tolerated the surgery well. No complications .Patient was taken to PACU where she was stabilized and then transferred to the orthopedic floor.  Patient started on Xarelto. Foot pumps applied bilaterally at 80 mm hgb. Heels elevated off bed with rolled towels. No evidence of DVT. Calves non tender. Negative [place]. Physical therapy started on day #1 for gait training and transfer with OT starting on  day #1 for ADL and assisted devices. Patient has done well with therapy. Ambulated greater than 200 feet upon being discharged.  Was able to ascend and descend 4 steps safely and independently  Patient's IV And Foley were discontinued on day #1 with Hemovac being discontinued on day #2. Dressing was changed on day 2  prior to patient being  discharged   He was given perioperative antibiotics:  Anti-infectives (From admission, onward)   Start     Dose/Rate Route Frequency Ordered Stop   11/12/17 1730  ceFAZolin (ANCEF) IVPB 2g/100 mL premix     2 g 200 mL/hr over 30 Minutes Intravenous Every 6 hours 11/12/17 1646 11/13/17 1729   11/12/17 0957  ceFAZolin (ANCEF) 2-4 GM/100ML-% IVPB    Note to Pharmacy:  Phineas Real   : cabinet override      11/12/17 0957 11/12/17 2214   11/12/17 0600  ceFAZolin (ANCEF) IVPB 2g/100 mL premix  Status:  Discontinued     2 g 200 mL/hr over 30 Minutes Intravenous On call to O.R. 11/11/17 2256 11/12/17 2426    .  He was fitted with AV 1 compression foot pump devices, instructed on heel pumps, early ambulation, and fitted with TED stockings bilaterally for DVT prophylaxis.  He benefited maximally from the hospital stay and there were no complications.    Recent vital signs:  Vitals:   11/13/17 0508 11/13/17 0742  BP: (!) 143/75 140/67  Pulse: 82 78  Resp: 18   Temp: 97.7 F (36.5 C) 97.7 F (36.5 C)  SpO2: 91% 93%    Recent laboratory studies:  Lab Results  Component Value Date   HGB 16.5 09/30/2017   HGB 12.9 (L) 04/12/2014   HGB 14.3 04/11/2014   Lab Results  Component Value Date   WBC 11.5 (H) 09/30/2017   PLT 228 09/30/2017   Lab Results  Component Value Date   INR 0.96 11/12/2017   Lab Results  Component Value Date   NA 126 (L) 11/13/2017   K 5.1 11/13/2017   CL 97 (L) 11/13/2017   CO2 21 (L) 11/13/2017   BUN 22 (H) 11/13/2017   CREATININE 1.76 (H) 11/13/2017   GLUCOSE 326 (H) 11/13/2017    Discharge Medications:   Allergies as of 11/13/2017   No Known Allergies     Medication List    TAKE these medications   albuterol 108 (90 Base) MCG/ACT inhaler Commonly known as:  PROVENTIL HFA;VENTOLIN HFA Inhale 1-2 puffs into the lungs every 6 (six) hours as needed for wheezing or shortness of breath.   amLODipine 10 MG tablet Commonly known as:   NORVASC Take 10 mg by mouth at bedtime.   aspirin 81 MG tablet Take 81 mg by mouth daily.   atorvastatin 10 MG tablet Commonly known as:  LIPITOR Take 10 mg by mouth daily.   cyclobenzaprine 10 MG tablet Commonly known as:  FLEXERIL Take 1 tablet (10 mg total) by mouth 3 (three) times daily as needed for muscle spasms.   fluticasone 50 MCG/ACT nasal spray Commonly known as:  FLONASE Place 2 sprays into both nostrils daily.   HYDROcodone-acetaminophen 5-325 MG tablet Commonly known as:  NORCO/VICODIN 1-2 tabs po q 8 hours prn What changed:    how much to take  how to take this  when to take this  reasons to take this  additional instructions   Magnesium Oxide 250 MG Tabs Take 2 tablets by mouth daily.   metFORMIN 500 MG tablet Commonly known as:  GLUCOPHAGE Take 500 mg by mouth daily with breakfast.   metoprolol succinate 25 MG 24 hr tablet Commonly known as:  TOPROL-XL Take 50 mg by mouth 2 (two) times daily.   mycophenolate 180 MG EC tablet Commonly known as:  MYFORTIC Take 360 mg by mouth 2 (two) times daily.  nicotine 10 MG inhaler Commonly known as:  NICOTROL Inhale 10 mg into the lungs every 2 (two) hours as needed for smoking cessation.   nortriptyline 25 MG capsule Commonly known as:  PAMELOR Take 25 mg by mouth at bedtime.   oxyCODONE 5 MG immediate release tablet Commonly known as:  Oxy IR/ROXICODONE Take 1 tablet (5 mg total) by mouth every 8 (eight) hours as needed. What changed:  Another medication with the same name was added. Make sure you understand how and when to take each.   Oxycodone HCl 10 MG Tabs Take 1 tablet (10 mg total) by mouth every 4 (four) hours as needed for severe pain (pain score 7-10). What changed:  You were already taking a medication with the same name, and this prescription was added. Make sure you understand how and when to take each.   oxyCODONE-acetaminophen 5-325 MG tablet Commonly known as:   PERCOCET/ROXICET Take 1 tablet by mouth every 4 (four) hours as needed for severe pain.   pantoprazole 40 MG tablet Commonly known as:  PROTONIX Take 1 tablet (40 mg total) by mouth 2 (two) times daily.   PARoxetine 20 MG tablet Commonly known as:  PAXIL Take 20 mg by mouth at bedtime.   rivaroxaban 20 MG Tabs tablet Commonly known as:  XARELTO Take 1 tablet (20 mg total) by mouth daily with supper.   spironolactone 25 MG tablet Commonly known as:  ALDACTONE Take 25 mg by mouth daily.   sucralfate 1 g tablet Commonly known as:  CARAFATE Take 1 tablet (1 g total) by mouth 4 (four) times daily.   tacrolimus 1 MG capsule Commonly known as:  PROGRAF Take 1 mg by mouth 2 (two) times daily.   traMADol 50 MG tablet Commonly known as:  ULTRAM Take 1-2 tablets (50-100 mg total) by mouth every 4 (four) hours as needed for moderate pain.            Durable Medical Equipment  (From admission, onward)         Start     Ordered   11/12/17 1647  DME Walker rolling  Once    Question:  Patient needs a walker to treat with the following condition  Answer:  Total knee replacement status   11/12/17 1646   11/12/17 1647  DME Bedside commode  Once    Question:  Patient needs a bedside commode to treat with the following condition  Answer:  Total knee replacement status   11/12/17 1646          Diagnostic Studies: Dg Chest Port 1 View  Result Date: 11/12/2017 CLINICAL DATA:  52 year old male status post left total knee arthroplasty today. Postoperative hypoxia. EXAM: PORTABLE CHEST 1 VIEW COMPARISON:  Chest radiographs 09/30/2017 and earlier. FINDINGS: Portable AP view at 1545 hours. Stable lung volumes and mediastinal contours. No pneumothorax, pulmonary edema or pleural effusion. New streaky retrocardiac opacity at the left lung base. No other confluent opacity. Visualized tracheal air column is within normal limits. Paucity bowel gas in the upper abdomen. No acute osseous  abnormality identified. IMPRESSION: New streaky left lower lobe opacity. Atelectasis favored but aspiration or pneumonia difficult to exclude in this setting. No pleural effusion. Electronically Signed   By: Genevie Ann M.D.   On: 11/12/2017 16:11   Dg Knee Left Port  Result Date: 11/12/2017 CLINICAL DATA:  Total knee replacement EXAM: PORTABLE LEFT KNEE - 1-2 VIEW COMPARISON:  None. FINDINGS: Total knee replacement in satisfactory position. No fracture  or immediate complication. Surgical drain in the soft tissues above the patella. IMPRESSION: Satisfactory left knee replacement. Electronically Signed   By: Franchot Gallo M.D.   On: 11/12/2017 15:10    Disposition:   Discharge Instructions    Increase activity slowly   Complete by:  As directed       Follow-up Information    Watt Climes, PA On 11/27/2017.   Specialty:  Physician Assistant Why:  at 8:45am Contact information: [place] [address] [phone]        Dereck Leep, MD On 12/25/2017.   Specialty:  Orthopedic Surgery Why:  at 3:15pm Contact information: [place] [address] [phone]            Signed: Watt Climes 11/13/2017, 7:51 AM

## 2017-11-13 NOTE — Evaluation (Signed)
Occupational Therapy Evaluation Patient Details Name: Billy Hughes MRN: [id number] DOB: Feb 02, 1965 Today's Date: 11/13/2017    History of Present Illness 52 y/o male s/p L TKA 11/12/17.     Clinical Impression   Pt seen for OT evaluation this date, POD#1 from above surgery. Pt was independent in all ADLs prior to surgery, however his mobility was somewhat limited due to L knee pain. Pt is eager to return to PLOF with less pain and improved safety and independence. Pt currently demonstrates impairments in pain, ROM, and strength requiring  minimal assist for LB dressing while in seated position due to pain and limited AROM of L knee. Pt's spouse available 24/7 for any needed assistance. Pt/spouse instructed in polar care mgt, falls prevention strategies, home/routines modifications, DME/AE for LB bathing and dressing tasks, and compression stocking mgt.  Do not currently anticipate any OT needs following this hospitalization. OT recommends pt to go home after discharge. Will sign off.      Follow Up Recommendations  No OT follow up    Equipment Recommendations  (reacher; Pt requested hospital bed due to need to climbing 15 steps in the home. Case manager notified. )    Recommendations for Other Services       Precautions / Restrictions Precautions Precautions: Fall;Knee Restrictions Weight Bearing Restrictions: Yes LLE Weight Bearing: Weight bearing as tolerated      Mobility Bed Mobility Overal bed mobility: Independent             General bed mobility comments: From EOB, pt with minimal hesitation, moved BLE into bed w/o assist. Moved LLE too quickly and experienced pain. VC provided to slow down.   Transfers Overall transfer level: Needs assistance Equipment used: Rolling walker (2 wheeled) Transfers: Sit to/from Stand Sit to Stand: Min guard         General transfer comment:  Some initial unsteadiness in standing but safe.    Balance Overall balance  assessment: Modified Independent Sitting-balance support: Feet supported;No upper extremity supported Sitting balance-Leahy Scale: Good       Standing balance-Leahy Scale: Good                             ADL either performed or assessed with clinical judgement   ADL Overall ADL's : Needs assistance/impaired Eating/Feeding: Independent   Grooming: Supervision/safety;Standing   Upper Body Bathing: Independent;Sitting   Lower Body Bathing: Minimal assistance;Sit to/from stand   Upper Body Dressing : Independent;Sitting   Lower Body Dressing: Minimal assistance;Sitting/lateral leans Lower Body Dressing Details (indicate cue type and reason): VC needed for technique with AE Toilet Transfer: Min guard;RW;Ambulation           Functional mobility during ADLs: Rolling walker;Min guard       Vision Baseline Vision/History: Wears glasses Wears Glasses: At all times Patient Visual Report: No change from baseline       Perception     Praxis      Pertinent Vitals/Pain Pain Assessment: 0-10 Pain Score: 7  Pain Location: R knee/hamstring; decreasing sitting in recliner. Pain to 10 when moved R knee to quickly moving EOB<>sup. Pain Descriptors / Indicators: Hervey Ard;Aching Pain Intervention(s): Limited activity within patient's tolerance;Repositioned;Ice applied;Monitored during session;Premedicated before session     Hand Dominance     Extremity/Trunk Assessment Upper Extremity Assessment Upper Extremity Assessment: Overall WFL for tasks assessed   Lower Extremity Assessment Lower Extremity Assessment: Defer to PT evaluation  Communication Communication Communication: No difficulties   Cognition Arousal/Alertness: Awake/alert Behavior During Therapy: WFL for tasks assessed/performed Overall Cognitive Status: Within Functional Limits for tasks assessed                                     General Comments  Pt supine in bed O2 sats  87-88% after moving from recliner to bed. Nasal cannula not in place at this time due to pt getting dressed. Nasal cannula put back in place O2 sats 88%. RN notified.     Exercises  Other Exercises Other Exercises: Pt/spouse educated in polar care manegement and compression sock mgt including donning/doffing, wear schedule, and positioning.  Other Exercises: Pt/spouse educated in use of AE for LB dressing to increase independence and safety. Other Exercises: Pt/spouse educated in falls prevention strategies to increase safety in the home.  Other Exercises: Pt/spouse educated in pet care mgt to increase safety upon returning home.   Shoulder Instructions      Home Living Family/patient expects to be discharged to:: Private residence Living Arrangements: Spouse/significant other Available Help at Discharge: Family;Available 24 hours/day Type of Home: House Home Access: Stairs to enter [employer] of Steps: 4 Entrance Stairs-Rails: Can reach both Home Layout: Two level Alternate Level Stairs-Number of Steps: 15+4 Alternate Level Stairs-Rails: Right Bathroom Shower/Tub: Walk-in shower(only accessible with 15 steps)   Bathroom Toilet: Handicapped height     Home Equipment: None          Prior Functioning/Environment Level of Independence: Independent        Comments: Pt has been limited due to chronic knee pain (and inability to take anti-inflammatories). 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         OT Problem List:        OT Treatment/Interventions:      OT Goals(Current goals can be found in the care plan section) Acute Rehab OT Goals Patient Stated Goal: to get back to work and travel with spouse OT Goal Formulation: All assessment and education complete, DC therapy Potential to Achieve Goals: Good  OT Frequency:     Barriers to D/C:             Co-evaluation              AM-PAC PT "6 Clicks" Daily Activity     Outcome Measure Help from another person eating meals?: None Help from another person taking care of personal grooming?: None Help from another person toileting, which includes using toliet, bedpan, or urinal?: None Help from another person bathing (including washing, rinsing, drying)?: A Little Help from another person to put on and taking off regular upper body clothing?: None Help from another person to put on and taking off regular lower body clothing?: None 6 Click Score: 23   End of Session Equipment Utilized During Treatment: Gait belt;Rolling walker  Activity Tolerance: Patient tolerated treatment well Patient left: in bed;with call bell/phone within reach;with bed alarm set;with family/visitor present;with SCD's reapplied  OT Visit Diagnosis: Other abnormalities of gait and mobility (R26.89);Pain Pain - Right/Left: Left Pain - part of body: Knee                Time: 1010-1115 OT Time Calculation (min): 65 min Charges:     Jadene Pierini OTS  11/13/2017, 12:05  PM

## 2017-11-13 NOTE — Evaluation (Signed)
Physical Therapy Evaluation Patient Details Name: Billy Hughes MRN: [id number] DOB: 08-Feb-1965 Today's Date: 11/13/2017   History of Present Illness  52 y/o male s/p L TKA 11/12/17.    Clinical Impression  Pt did well with PT exam and showed good motivation and effort despite considerable pain with most acts (marked improvement with IV pain meds mid session).  He was able to achieve AROM 0-84 degrees and showed good quad set strength and easily performed 10 SLRs.  He was on 2 liters O2 t/o the session and though he stayed in the low 90s seated and with minimal activity he did drop to the mid 80s with modest bout of ambulation (on 2 liters) w/o c/o excessive fatigue or shortness of breath ("I just need to quit smoking." - PT did discusses implications of smoking with surgical recovery and general concerns).  Pt highly motivated and did well with gait training as well as exercises in addition to formal PT exam.  Pt will have to deal with a flight of steps QD at home to get to bedroom, so we will likely trial stair this afternoon to work toward appropriate discharge.      Follow Up Recommendations Home health PT    Equipment Recommendations  Rolling walker with 5" wheels    Recommendations for Other Services       Precautions / Restrictions Precautions Precautions: Fall;Knee Restrictions Weight Bearing Restrictions: Yes LLE Weight Bearing: Weight bearing as tolerated      Mobility  Bed Mobility Overal bed mobility: Independent             General bed mobility comments: Pt easily gets to EOB w/o assist, minimal hesitation  Transfers Overall transfer level: Modified independent Equipment used: Rolling walker (2 wheeled)             General transfer comment: Minimal cuing and explanation, pt able to rise with good confidence.  Some initial unsteadiness in standing but safe.  Ambulation/Gait Ambulation/Gait assistance: Min guard Gait Distance (Feet): 60  Feet Assistive device: Rolling walker (2 wheeled)       General Gait Details: Pt was able to ambulate into the hallway with slow but consistent cadence.  Step-to pattern with some hesitation with L WBing and 2 small bouts of buckling.    Stairs            Wheelchair Mobility    Modified Rankin (Stroke Patients Only)       Balance Overall balance assessment: Independent                                           Pertinent Vitals/Pain Pain Assessment: 0-10 Pain Score: 9  Pain Location: R knee, reports significant decrease with IV pain meds mid session    Home Living Family/patient expects to be discharged to:: Private residence Living Arrangements: Spouse/significant other Available Help at Discharge: Family;Available 24 hours/day Type of Home: House Home Access: Stairs to enter Entrance Stairs-Rails: Can reach both Entrance Stairs-Number of Steps: 4 Home Layout: Two level        Prior Function Level of Independence: Independent         Comments: Pt has been limited due to chronic knee pain (and inability to take anti-inflammatories) but overall was able to take care of him      Hand Dominance        Extremity/Trunk Assessment  Upper Extremity Assessment Upper Extremity Assessment: Overall WFL for tasks assessed    Lower Extremity Assessment Lower Extremity Assessment: Overall WFL for tasks assessed(expected post op weakness, able to do SLRs, quad sets, etc)       Communication   Communication: No difficulties  Cognition Arousal/Alertness: Awake/alert Behavior During Therapy: WFL for tasks assessed/performed Overall Cognitive Status: Within Functional Limits for tasks assessed                                        General Comments      Exercises Total Joint Exercises Ankle Circles/Pumps: AROM;10 reps Quad Sets: Strengthening;10 reps Heel Slides: AROM;10 reps Hip ABduction/ADduction: Strengthening;10  reps Straight Leg Raises: Strengthening;10 reps Knee Flexion: PROM;5 reps Goniometric ROM: 0-84    Assessment/Plan    PT Assessment Patient needs continued PT services  PT Problem List Decreased strength;Decreased range of motion;Decreased activity tolerance;Decreased balance;Decreased mobility;Decreased coordination;Decreased cognition;Decreased safety awareness;Decreased knowledge of use of DME;Pain;Cardiopulmonary status limiting activity       PT Treatment Interventions DME instruction;Gait training;Stair training;Functional mobility training;Therapeutic activities;Therapeutic exercise;Balance training;Neuromuscular re-education;Patient/family education    PT Goals (Current goals can be found in the Care Plan section)  Acute Rehab PT Goals Patient Stated Goal: go home 11/1 AM PT Goal Formulation: With patient Time For Goal Achievement: 11/27/17 Potential to Achieve Goals: Good    Frequency BID   Barriers to discharge        Co-evaluation               AM-PAC PT "6 Clicks" Daily Activity  Outcome Measure Difficulty turning over in bed (including adjusting bedclothes, sheets and blankets)?: None Difficulty moving from lying on back to sitting on the side of the bed? : None Difficulty sitting down on and standing up from a chair with arms (e.g., wheelchair, bedside commode, etc,.)?: A Little Help needed moving to and from a bed to chair (including a wheelchair)?: None Help needed walking in hospital room?: A Little Help needed climbing 3-5 steps with a railing? : A Little 6 Click Score: 21    End of Session Equipment Utilized During Treatment: Gait belt;Oxygen(2 liters) Activity Tolerance: Patient tolerated treatment well Patient left: with chair alarm set;with call bell/phone within reach Nurse Communication: Mobility status PT Visit Diagnosis: Muscle weakness (generalized) (M62.81);Difficulty in walking, not elsewhere classified (R26.2);Pain Pain - Right/Left:  Right Pain - part of body: Knee    Time: 0277-4128 PT Time Calculation (min) (ACUTE ONLY): 48 min   Charges:   PT Evaluation $PT Eval Low Complexity: 1 Low PT Treatments $Gait Training: 8-22 mins $Therapeutic Exercise: 8-22 mins        Kreg Shropshire, DPT 11/13/2017, 10:13 AM

## 2017-11-13 NOTE — Care Management (Signed)
Notified patient that hospital bed would be covered.

## 2017-11-13 NOTE — NC FL2 (Signed)
[place] LEVEL OF CARE SCREENING TOOL     IDENTIFICATION  Patient Name: Billy Hughes Birthdate: 1965/10/01 Sex: male Admission Date (Current Location): 11/12/2017  [place] and [place] Number:  Engineering geologist and Address:  [facility], [address] [place], [place] [address]      Provider Number: [id number]  Attending Physician Name and Address:  Dereck Leep, MD  Relative Name and Phone Number:       Current Level of Care: Hospital Recommended Level of Care: [place] Prior Approval Number:    Date Approved/Denied:   PASRR Number: [id number])  Discharge Plan: SNF    Current Diagnoses: Patient Active Problem List   Diagnosis Date Noted  . S/P total knee arthroplasty 11/12/2017  . Primary osteoarthritis of left knee 05/30/2017  . Hypertension 02/28/2017  . Renal disorder 02/28/2017  . DVT (deep venous thrombosis) ([place]) 02/28/2017  . GERD without esophagitis 02/10/2017  . Hyperlipidemia 02/10/2017  . Recurrent major depressive disorder, in partial remission ([place]) 02/10/2017  . Post concussion syndrome 03/05/2016  . Kidney transplant status 11/05/2011    Orientation RESPIRATION BLADDER Height & Weight     Self, Time, Situation, Place  O2(2 Liters Oxygen. ) Continent Weight: 290 lb 9.1 oz (131.8 kg) Height:  6' (182.9 cm)  BEHAVIORAL SYMPTOMS/MOOD NEUROLOGICAL BOWEL NUTRITION STATUS      Continent Diet(Diet: Regular )  AMBULATORY STATUS COMMUNICATION OF NEEDS Skin   Extensive Assist Verbally Surgical wounds(Incision: Left Knee. )                       Personal Care Assistance Level of Assistance  Bathing, Feeding, Dressing Bathing Assistance: Limited assistance Feeding assistance: Independent Dressing Assistance: Limited assistance     Functional Limitations Info  Sight, Hearing, Speech Sight Info: Adequate Hearing Info: Adequate Speech Info: Adequate    SPECIAL CARE  FACTORS FREQUENCY  PT (By licensed PT), OT (By licensed OT)     PT Frequency: (5) OT Frequency: (5)            Contractures      Additional Factors Info  Code Status, Allergies Code Status Info: (Full Code. ) Allergies Info: (No Known Allergies. )           Current Medications (11/13/2017):  This is the current hospital active medication list Current Facility-Administered Medications  Medication Dose Route Frequency Provider Last Rate Last Dose  . 0.9 %  sodium chloride infusion   Intravenous Continuous Hooten, Laurice Record, MD 100 mL/hr at 11/12/17 1741    . acetaminophen (OFIRMEV) IV 1,000 mg  1,000 mg Intravenous Q6H Hooten, Laurice Record, MD 400 mL/hr at 11/12/17 2355 1,000 mg at 11/12/17 2355  . acetaminophen (TYLENOL) tablet 325-650 mg  325-650 mg Oral Q6H PRN Hooten, Laurice Record, MD      . alum & mag hydroxide-simeth (MAALOX/MYLANTA) 200-200-20 MG/5ML suspension 30 mL  30 mL Oral Q4H PRN Hooten, Laurice Record, MD      . amLODipine (NORVASC) tablet 10 mg  10 mg Oral QHS Hooten, Laurice Record, MD      . atorvastatin (LIPITOR) tablet 10 mg  10 mg Oral Daily Hooten, Laurice Record, MD   10 mg at 11/13/17 0914  . bisacodyl (DULCOLAX) suppository 10 mg  10 mg Rectal Daily PRN Hooten, Laurice Record, MD      . ceFAZolin (ANCEF) IVPB 2g/100 mL premix  2 g Intravenous Q6H Hooten, Laurice Record, MD 200 mL/hr  at 11/13/17 0051 2 g at 11/13/17 0051  . cyclobenzaprine (FLEXERIL) tablet 10 mg  10 mg Oral TID PRN Dereck Leep, MD   10 mg at 11/12/17 2136  . diphenhydrAMINE (BENADRYL) 12.5 MG/5ML elixir 12.5-25 mg  12.5-25 mg Oral Q4H PRN Hooten, Laurice Record, MD      . ferrous sulfate tablet 325 mg  325 mg Oral BID WC Hooten, Laurice Record, MD   325 mg at 11/13/17 0804  . gabapentin (NEURONTIN) capsule 300 mg  300 mg Oral QHS Hooten, Laurice Record, MD   300 mg at 11/12/17 2123  . HYDROmorphone (DILAUDID) injection 0.5-1 mg  0.5-1 mg Intravenous Q4H PRN Hooten, Laurice Record, MD   1 mg at 11/13/17 0915  . insulin aspart (novoLOG) injection 0-15 Units   0-15 Units Subcutaneous TID WC Hooten, Laurice Record, MD   8 Units at 11/13/17 0804  . magnesium hydroxide (MILK OF MAGNESIA) suspension 30 mL  30 mL Oral Daily Hooten, Laurice Record, MD   30 mL at 11/13/17 0914  . magnesium oxide (MAG-OX) tablet 800 mg  800 mg Oral Daily Hooten, Laurice Record, MD   400 mg at 11/13/17 0915  . menthol-cetylpyridinium (CEPACOL) lozenge 3 mg  1 lozenge Oral PRN Hooten, Laurice Record, MD       Or  . phenol (CHLORASEPTIC) mouth spray 1 spray  1 spray Mouth/Throat PRN Hooten, Laurice Record, MD      . metoCLOPramide (REGLAN) tablet 5-10 mg  5-10 mg Oral Q8H PRN Hooten, Laurice Record, MD       Or  . metoCLOPramide (REGLAN) injection 5-10 mg  5-10 mg Intravenous Q8H PRN Hooten, Laurice Record, MD      . metoCLOPramide (REGLAN) tablet 10 mg  10 mg Oral TID AC & HS Hooten, Laurice Record, MD   10 mg at 11/13/17 0804  . metoprolol succinate (TOPROL-XL) 24 hr tablet 50 mg  50 mg Oral BID Dereck Leep, MD   50 mg at 11/12/17 2125  . mycophenolate (MYFORTIC) EC tablet 360 mg  360 mg Oral BID Dereck Leep, MD   360 mg at 11/13/17 6568  . nicotine polacrilex (NICORETTE) gum 2 mg  2 mg Oral PRN Hooten, Laurice Record, MD      . nortriptyline (PAMELOR) capsule 25 mg  25 mg Oral QHS Dereck Leep, MD   25 mg at 11/12/17 2136  . ondansetron (ZOFRAN) tablet 4 mg  4 mg Oral Q6H PRN Hooten, Laurice Record, MD       Or  . ondansetron (ZOFRAN) injection 4 mg  4 mg Intravenous Q6H PRN Hooten, Laurice Record, MD      . oxyCODONE (Oxy IR/ROXICODONE) immediate release tablet 10 mg  10 mg Oral Q4H PRN Dereck Leep, MD   10 mg at 11/13/17 0803  . oxyCODONE (Oxy IR/ROXICODONE) immediate release tablet 5 mg  5 mg Oral Q4H PRN Hooten, Laurice Record, MD   5 mg at 11/12/17 2013  . pantoprazole (PROTONIX) EC tablet 40 mg  40 mg Oral BID Dereck Leep, MD   40 mg at 11/13/17 0915  . PARoxetine (PAXIL) tablet 20 mg  20 mg Oral QHS Hooten, Laurice Record, MD   20 mg at 11/12/17 2124  . polyvinyl alcohol (LIQUIFILM TEARS) 1.4 % ophthalmic solution 1 drop  1 drop Both Eyes  PRN Hooten, Laurice Record, MD   1 drop at 11/12/17 2024  . rivaroxaban (XARELTO) tablet 20 mg  20 mg Oral Q supper  Hooten, Laurice Record, MD      . senna-docusate (Senokot-S) tablet 1 tablet  1 tablet Oral BID Dereck Leep, MD   1 tablet at 11/13/17 0914  . sodium phosphate (FLEET) 7-19 GM/118ML enema 1 enema  1 enema Rectal Once PRN Hooten, Laurice Record, MD      . spironolactone (ALDACTONE) tablet 25 mg  25 mg Oral Daily Hooten, Laurice Record, MD      . tacrolimus (PROGRAF) capsule 1 mg  1 mg Oral BID Hooten, Laurice Record, MD   1 mg at 11/13/17 0914  . traMADol (ULTRAM) tablet 50-100 mg  50-100 mg Oral Q4H PRN Dereck Leep, MD   100 mg at 11/12/17 2123     Discharge Medications: Please see discharge summary for a list of discharge medications.  Relevant Imaging Results:  Relevant Lab Results:   Additional Information (SSN: [id number])  Trinh Sanjose, Veronia Beets, LCSW

## 2017-11-13 NOTE — Progress Notes (Signed)
Clinical Social Worker (CSW) received SNF consult. PT is recommending home health. RN case manager aware of above. Please reconsult if future social work needs arise. CSW signing off.   Marysue Fait, LCSW [phone] 

## 2017-11-13 NOTE — Anesthesia Postprocedure Evaluation (Signed)
Anesthesia Post Note  Patient: Billy Hughes  Procedure(s) Performed: COMPUTER ASSISTED TOTAL KNEE ARTHROPLASTY (Left Knee)  Patient location during evaluation: PACU Anesthesia Type: Spinal Level of consciousness: awake and alert Pain management: pain level controlled Vital Signs Assessment: post-procedure vital signs reviewed and stable Respiratory status: spontaneous breathing, nonlabored ventilation and respiratory function stable Cardiovascular status: blood pressure returned to baseline and stable Postop Assessment: no apparent nausea or vomiting Anesthetic complications: no     Last Vitals:  Vitals:   11/13/17 0508 11/13/17 0742  BP: (!) 143/75 140/67  Pulse: 82 78  Resp: 18   Temp: 36.5 C 36.5 C  SpO2: 91% 93%    Last Pain:  Vitals:   11/13/17 0742  TempSrc: Oral  PainSc:                  Durenda Hurt

## 2017-11-14 LAB — BASIC METABOLIC PANEL
Anion gap: 8 (ref 5–15)
BUN: 23 mg/dL — AB (ref 6–20)
CHLORIDE: 100 mmol/L (ref 98–111)
CO2: 25 mmol/L (ref 22–32)
Calcium: 9.4 mg/dL (ref 8.9–10.3)
Creatinine, Ser: 1.5 mg/dL — ABNORMAL HIGH (ref 0.61–1.24)
GFR calc Af Amer: 60 mL/min — ABNORMAL LOW (ref >60)
GFR calc non Af Amer: 51 mL/min — ABNORMAL LOW (ref >60)
GLUCOSE: 176 mg/dL — AB (ref 70–99)
POTASSIUM: 4.6 mmol/L (ref 3.5–5.1)
Sodium: 133 mmol/L — ABNORMAL LOW (ref 135–145)

## 2017-11-14 LAB — GLUCOSE, CAPILLARY: GLUCOSE-CAPILLARY: 138 mg/dL — AB (ref 70–99)

## 2017-11-14 NOTE — Progress Notes (Signed)
Patient is being discharged to home with family. DC, RX instructions, and IV removed by SWOT nurse, Theadora Rama.  This nurse changed bandage and washed leg per Dr. Instructions. Gave patient 2 additional honeycomb dressings.

## 2017-11-14 NOTE — Progress Notes (Signed)
Physical Therapy Treatment Patient Details Name: Billy Hughes MRN: [id number] DOB: 1965-08-23 Today's Date: 11/14/2017    History of Present Illness 52 y/o male s/p L TKA 11/12/17.      PT Comments    Pt did well with all aspects of PT this AM showing ability to maintain excellent quad contraction, AROM 0-102, circumambulation of the nurses station with consistent cadence, no excessive fatigue and minimal reliance on the UEs.  He was also able to negotiate a flight of steps using only single R rail with no safety issues and good overall confidence.  Pt has progressed nicely and is safe to go home with HHPT from PT standpoint.   Follow Up Recommendations  Home health PT     Equipment Recommendations  Rolling walker with 5" wheels    Recommendations for Other Services       Precautions / Restrictions Precautions Precautions: Fall;Knee Precaution Booklet Issued: Yes (comment) Restrictions LLE Weight Bearing: Weight bearing as tolerated    Mobility  Bed Mobility Overal bed mobility: Independent             General bed mobility comments: Easily transitions to either side of bed w/o assist  Transfers Overall transfer level: Needs assistance Equipment used: Rolling walker (2 wheeled) Transfers: Sit to/from Stand Sit to Stand: Supervision         General transfer comment: Minimal cuing to remind about hand placement and set up, but no direct assist required  Ambulation/Gait Ambulation/Gait assistance: Min guard Gait Distance (Feet): 300 Feet Assistive device: Rolling walker (2 wheeled)       General Gait Details: Pt with slow but consistent cadence w/o heavy reliance on the walker and good overall confidence.  Minimal knee buckling while try to really minimize UE usage but no LOBs or true safety concerns (pt able to take a few occasional steps w/o AD but PT encourged him no to play with this on his own for now).   Stairs Stairs: Yes Stairs assistance:  Supervision Stair Management: One rail Right Number of Stairs: 15 General stair comments: Pt was able to negotiate up/down flight of steps with R UE only and did not have issues with safety or ability to place feet using "off" single rail (simulating home)   Wheelchair Mobility    Modified Rankin (Stroke Patients Only)       Balance Overall balance assessment: Modified Independent                                          Cognition Arousal/Alertness: Awake/alert Behavior During Therapy: WFL for tasks assessed/performed Overall Cognitive Status: Within Functional Limits for tasks assessed                                        Exercises Total Joint Exercises Ankle Circles/Pumps: AROM;10 reps Quad Sets: Strengthening;15 reps Short Arc Quad: Strengthening;10 reps Heel Slides: AROM;10 reps(resisted leg extension) Hip ABduction/ADduction: Strengthening;10 reps Straight Leg Raises: Strengthening;10 reps Knee Flexion: PROM;5 reps Goniometric ROM: AROM 0-102    General Comments        Pertinent Vitals/Pain Pain Score: 3  Pain Location: increased pain with ROM    Home Living  Prior Function            PT Goals (current goals can now be found in the care plan section) Progress towards PT goals: Progressing toward goals    Frequency    BID      PT Plan Current plan remains appropriate    Co-evaluation              AM-PAC PT "6 Clicks" Daily Activity  Outcome Measure  Difficulty turning over in bed (including adjusting bedclothes, sheets and blankets)?: None Difficulty moving from lying on back to sitting on the side of the bed? : None Difficulty sitting down on and standing up from a chair with arms (e.g., wheelchair, bedside commode, etc,.)?: None Help needed moving to and from a bed to chair (including a wheelchair)?: None Help needed walking in hospital room?: None Help needed climbing  3-5 steps with a railing? : None 6 Click Score: 24    End of Session Equipment Utilized During Treatment: Gait belt Activity Tolerance: Patient tolerated treatment well Patient left: with chair alarm set;with call bell/phone within reach Nurse Communication: Mobility status PT Visit Diagnosis: Muscle weakness (generalized) (M62.81);Difficulty in walking, not elsewhere classified (R26.2);Pain Pain - Right/Left: Right Pain - part of body: Knee     Time: 0240-9735 PT Time Calculation (min) (ACUTE ONLY): 32 min  Charges:  $Gait Training: 8-22 mins $Therapeutic Exercise: 8-22 mins                     Kreg Shropshire, DPT 11/14/2017, 9:00 AM

## 2017-11-14 NOTE — Progress Notes (Signed)
   Subjective: 2 Days Post-Op Procedure(s) (LRB): COMPUTER ASSISTED TOTAL KNEE ARTHROPLASTY (Left) Patient reports pain as 2 on 0-10 scale.   Patient very pleased with results so far. Patient is well, and has had no acute complaints or problems Patient was able to ambulate around the nurses desk and do steps yesterday.  Has met goals to go home.  Range of motion 0-84 Plan is to go Home after hospital stay. no nausea and no vomiting Patient denies any chest pains or shortness of breath. Objective: Vital signs in last 24 hours: Temp:  [97.5 F (36.4 C)-98.8 F (37.1 C)] 97.5 F (36.4 C) (11/01 0007) Pulse Rate:  [71-82] 78 (11/01 0458) Resp:  [16] 16 (11/01 0007) BP: (140-156)/(67-76) 150/75 (11/01 0007) SpO2:  [88 %-93 %] 93 % (11/01 0458) FiO2 (%):  [2 %] 2 % (10/31 1528) well approximated incision Heels are non tender and elevated off the bed using rolled towels Intake/Output from previous day: 10/31 0701 - 11/01 0700 In: 480 [P.O.:480] Out: 125 [Drains:125] Intake/Output this shift: No intake/output data recorded.  No results for input(s): HGB in the last 72 hours. No results for input(s): WBC, RBC, HCT, PLT in the last 72 hours. Recent Labs    11/13/17 0605  NA 126*  K 5.1  CL 97*  CO2 21*  BUN 22*  CREATININE 1.76*  GLUCOSE 326*  CALCIUM 8.9   Recent Labs    11/12/17 1006  INR 0.96    EXAM General - Patient is Alert, Appropriate and Oriented Extremity - Neurologically intact Neurovascular intact Sensation intact distally Intact pulses distally Dorsiflexion/Plantar flexion intact No cellulitis present Compartment soft Dressing - dressing C/D/I Motor Function - intact, moving foot and toes well on exam.    Past Medical History:  Diagnosis Date  . Anemia   . Anxiety   . Cancer ([place])    skin  . Diabetes mellitus without complication ([place])   . Diverticulitis   . Gastric ulcer   . GERD (gastroesophageal reflux disease)   . Hypertension   . Renal  disorder     Assessment/Plan: 2 Days Post-Op Procedure(s) (LRB): COMPUTER ASSISTED TOTAL KNEE ARTHROPLASTY (Left) Active Problems:   S/P total knee arthroplasty  Estimated body mass index is 39.41 kg/m as calculated from the following:   Height as of this encounter: 6' (1.829 m).   Weight as of this encounter: 131.8 kg. Up with therapy Discharge home with home health  Labs: Pending DVT Prophylaxis - Xarelto, Foot Pumps and TED hose Weight-Bearing as tolerated to left leg Hemovac discontinued on today's visit.  Ends of the drain appeared to be intact. Please wash operative leg, change dressing and apply TED stockings to both legs. Please give the patient 2 extra honeycomb dressings to take home  Jon R. [place] 11/14/2017, 7:00 AM

## 2017-11-17 ENCOUNTER — Telehealth: Payer: Self-pay

## 2017-11-17 NOTE — Telephone Encounter (Signed)
EMMI Follow-up: Noted on the report that the patient hadn't read his discharge paperwork yet.  I called to talk with Billy Hughes and his wife, Billy Hughes said he was sleeping.  I explained I was trying to reach him about his discharge paperwork and she said he was aware of his follow-up appointments.  I asked her to let him know there would be a second automated call and to let us know if he had any concerns at that time.

## 2017-11-19 ENCOUNTER — Telehealth: Payer: Self-pay

## 2017-11-19 NOTE — Telephone Encounter (Signed)
EMMI Follow-up:  Mr. Billy Hughes called as he got cut off trying to respond to the questions on the second automated call.  I asked if he had any concerns and reviewed the survey questions with him. He said everything was fine and he felt the automated calls were helpful. No needs noted for today.  Both calls completed.

## 2017-12-04 ENCOUNTER — Observation Stay: Payer: BLUE CROSS/BLUE SHIELD

## 2017-12-04 ENCOUNTER — Encounter: Payer: Self-pay | Admitting: Emergency Medicine

## 2017-12-04 ENCOUNTER — Observation Stay
Admission: EM | Admit: 2017-12-04 | Discharge: 2017-12-06 | Disposition: A | Discharge status: Home / Self Care | Payer: BLUE CROSS/BLUE SHIELD | Attending: Internal Medicine | Admitting: Internal Medicine

## 2017-12-04 ENCOUNTER — Other Ambulatory Visit: Payer: Self-pay

## 2017-12-04 DIAGNOSIS — R531 Weakness: Secondary | ICD-10-CM | POA: Diagnosis not present

## 2017-12-04 DIAGNOSIS — F419 Anxiety disorder, unspecified: Secondary | ICD-10-CM | POA: Insufficient documentation

## 2017-12-04 DIAGNOSIS — R7989 Other specified abnormal findings of blood chemistry: Secondary | ICD-10-CM | POA: Insufficient documentation

## 2017-12-04 DIAGNOSIS — N183 Chronic kidney disease, stage 3 (moderate): Secondary | ICD-10-CM | POA: Insufficient documentation

## 2017-12-04 DIAGNOSIS — I129 Hypertensive chronic kidney disease with stage 1 through stage 4 chronic kidney disease, or unspecified chronic kidney disease: Secondary | ICD-10-CM | POA: Insufficient documentation

## 2017-12-04 DIAGNOSIS — N179 Acute kidney failure, unspecified: Secondary | ICD-10-CM | POA: Diagnosis not present

## 2017-12-04 DIAGNOSIS — K219 Gastro-esophageal reflux disease without esophagitis: Secondary | ICD-10-CM | POA: Diagnosis not present

## 2017-12-04 DIAGNOSIS — I10 Essential (primary) hypertension: Secondary | ICD-10-CM | POA: Diagnosis not present

## 2017-12-04 DIAGNOSIS — E1122 Type 2 diabetes mellitus with diabetic chronic kidney disease: Secondary | ICD-10-CM | POA: Diagnosis not present

## 2017-12-04 DIAGNOSIS — F17210 Nicotine dependence, cigarettes, uncomplicated: Secondary | ICD-10-CM | POA: Insufficient documentation

## 2017-12-04 DIAGNOSIS — Z940 Kidney transplant status: Secondary | ICD-10-CM | POA: Diagnosis not present

## 2017-12-04 DIAGNOSIS — R630 Anorexia: Secondary | ICD-10-CM | POA: Diagnosis not present

## 2017-12-04 DIAGNOSIS — Z86718 Personal history of other venous thrombosis and embolism: Secondary | ICD-10-CM | POA: Insufficient documentation

## 2017-12-04 DIAGNOSIS — S37009A Unspecified injury of unspecified kidney, initial encounter: Secondary | ICD-10-CM | POA: Diagnosis present

## 2017-12-04 DIAGNOSIS — R112 Nausea with vomiting, unspecified: Secondary | ICD-10-CM | POA: Insufficient documentation

## 2017-12-04 DIAGNOSIS — Z7901 Long term (current) use of anticoagulants: Secondary | ICD-10-CM | POA: Insufficient documentation

## 2017-12-04 DIAGNOSIS — Z79899 Other long term (current) drug therapy: Secondary | ICD-10-CM | POA: Insufficient documentation

## 2017-12-04 DIAGNOSIS — E785 Hyperlipidemia, unspecified: Secondary | ICD-10-CM | POA: Diagnosis not present

## 2017-12-04 DIAGNOSIS — Z992 Dependence on renal dialysis: Secondary | ICD-10-CM | POA: Insufficient documentation

## 2017-12-04 DIAGNOSIS — E860 Dehydration: Secondary | ICD-10-CM | POA: Insufficient documentation

## 2017-12-04 DIAGNOSIS — N1330 Unspecified hydronephrosis: Secondary | ICD-10-CM

## 2017-12-04 DIAGNOSIS — E871 Hypo-osmolality and hyponatremia: Secondary | ICD-10-CM | POA: Diagnosis not present

## 2017-12-04 DIAGNOSIS — Z7982 Long term (current) use of aspirin: Secondary | ICD-10-CM | POA: Insufficient documentation

## 2017-12-04 DIAGNOSIS — F3341 Major depressive disorder, recurrent, in partial remission: Secondary | ICD-10-CM | POA: Insufficient documentation

## 2017-12-04 DIAGNOSIS — Z96652 Presence of left artificial knee joint: Secondary | ICD-10-CM | POA: Insufficient documentation

## 2017-12-04 DIAGNOSIS — Z6839 Body mass index (BMI) 39.0-39.9, adult: Secondary | ICD-10-CM | POA: Insufficient documentation

## 2017-12-04 DIAGNOSIS — Z7984 Long term (current) use of oral hypoglycemic drugs: Secondary | ICD-10-CM | POA: Insufficient documentation

## 2017-12-04 LAB — URINALYSIS, COMPLETE (UACMP) WITH MICROSCOPIC
Bacteria, UA: NONE SEEN
Bilirubin Urine: NEGATIVE
GLUCOSE, UA: NEGATIVE mg/dL
HGB URINE DIPSTICK: NEGATIVE
Ketones, ur: NEGATIVE mg/dL
LEUKOCYTES UA: NEGATIVE
NITRITE: NEGATIVE
PH: 5 (ref 5.0–8.0)
Protein, ur: NEGATIVE mg/dL
SPECIFIC GRAVITY, URINE: 1.01 (ref 1.005–1.030)

## 2017-12-04 LAB — CBC
HCT: 43.1 % (ref 39.0–52.0)
Hemoglobin: 13.7 g/dL (ref 13.0–17.0)
MCH: 27.7 pg (ref 26.0–34.0)
MCHC: 31.8 g/dL (ref 30.0–36.0)
MCV: 87.1 fL (ref 80.0–100.0)
NRBC: 0 % (ref 0.0–0.2)
PLATELETS: 566 10*3/uL — AB (ref 150–400)
RBC: 4.95 MIL/uL (ref 4.22–5.81)
RDW: 14.4 % (ref 11.5–15.5)
WBC: 11.5 10*3/uL — ABNORMAL HIGH (ref 4.0–10.5)

## 2017-12-04 LAB — GLUCOSE, CAPILLARY
GLUCOSE-CAPILLARY: 186 mg/dL — AB (ref 70–99)
Glucose-Capillary: 112 mg/dL — ABNORMAL HIGH (ref 70–99)

## 2017-12-04 LAB — BASIC METABOLIC PANEL
ANION GAP: 13 (ref 5–15)
BUN: 34 mg/dL — ABNORMAL HIGH (ref 6–20)
CALCIUM: 10.2 mg/dL (ref 8.9–10.3)
CO2: 22 mmol/L (ref 22–32)
Chloride: 94 mmol/L — ABNORMAL LOW (ref 98–111)
Creatinine, Ser: 2.43 mg/dL — ABNORMAL HIGH (ref 0.61–1.24)
GFR, EST AFRICAN AMERICAN: 33 mL/min — AB (ref >60)
GFR, EST NON AFRICAN AMERICAN: 29 mL/min — AB (ref >60)
Glucose, Bld: 208 mg/dL — ABNORMAL HIGH (ref 70–99)
Potassium: 4.5 mmol/L (ref 3.5–5.1)
Sodium: 129 mmol/L — ABNORMAL LOW (ref 135–145)

## 2017-12-04 MED ORDER — PAROXETINE HCL 20 MG PO TABS
20.0000 mg | ORAL_TABLET | Freq: Every day | ORAL | Status: DC
  Administered 2017-12-04 – 2017-12-05 (×2): 20 mg via ORAL
  Filled 2017-12-04 (×3): qty 1

## 2017-12-04 MED ORDER — HYDROMORPHONE HCL 1 MG/ML IJ SOLN
1.0000 mg | Freq: Once | INTRAMUSCULAR | Status: AC
  Administered 2017-12-05: 1 mg via INTRAVENOUS
  Filled 2017-12-04: qty 1

## 2017-12-04 MED ORDER — TACROLIMUS 1 MG PO CAPS
1.0000 mg | ORAL_CAPSULE | Freq: Two times a day (BID) | ORAL | Status: DC
  Administered 2017-12-04 – 2017-12-06 (×4): 1 mg via ORAL
  Filled 2017-12-04 (×5): qty 1

## 2017-12-04 MED ORDER — ACETAMINOPHEN 325 MG PO TABS: 650.0000 mg | ORAL_TABLET | Freq: Four times a day (QID) | ORAL | Status: DC | PRN

## 2017-12-04 MED ORDER — ACETAMINOPHEN 650 MG RE SUPP: 650.0000 mg | Freq: Four times a day (QID) | RECTAL | Status: DC | PRN

## 2017-12-04 MED ORDER — RIVAROXABAN 20 MG PO TABS
20.0000 mg | ORAL_TABLET | Freq: Every day | ORAL | Status: DC
  Administered 2017-12-04 – 2017-12-05 (×2): 20 mg via ORAL
  Filled 2017-12-04 (×3): qty 1

## 2017-12-04 MED ORDER — PANTOPRAZOLE SODIUM 40 MG PO TBEC
40.0000 mg | DELAYED_RELEASE_TABLET | Freq: Two times a day (BID) | ORAL | Status: DC
  Administered 2017-12-04 – 2017-12-06 (×4): 40 mg via ORAL
  Filled 2017-12-04 (×4): qty 1

## 2017-12-04 MED ORDER — ASPIRIN EC 81 MG PO TBEC
81.0000 mg | DELAYED_RELEASE_TABLET | Freq: Every day | ORAL | Status: DC
  Administered 2017-12-04 – 2017-12-05 (×2): 81 mg via ORAL
  Filled 2017-12-04 (×2): qty 1

## 2017-12-04 MED ORDER — SODIUM CHLORIDE 0.9 % IV BOLUS
1000.0000 mL | Freq: Once | INTRAVENOUS | Status: AC
  Administered 2017-12-04: 1000 mL via INTRAVENOUS

## 2017-12-04 MED ORDER — INSULIN ASPART 100 UNIT/ML ~~LOC~~ SOLN
0.0000 [IU] | Freq: Three times a day (TID) | SUBCUTANEOUS | Status: DC
  Administered 2017-12-05: 2 [IU] via SUBCUTANEOUS
  Administered 2017-12-05: 1 [IU] via SUBCUTANEOUS
  Filled 2017-12-04 (×2): qty 1

## 2017-12-04 MED ORDER — SODIUM CHLORIDE 0.9 % IV SOLN
INTRAVENOUS | Status: DC
  Administered 2017-12-04 – 2017-12-06 (×4): via INTRAVENOUS

## 2017-12-04 MED ORDER — ONDANSETRON HCL 4 MG/2ML IJ SOLN: 4.0000 mg | Freq: Four times a day (QID) | INTRAMUSCULAR | Status: DC | PRN

## 2017-12-04 MED ORDER — NORTRIPTYLINE HCL 25 MG PO CAPS
25.0000 mg | ORAL_CAPSULE | Freq: Every day | ORAL | Status: DC
  Administered 2017-12-04 – 2017-12-05 (×2): 25 mg via ORAL
  Filled 2017-12-04 (×3): qty 1

## 2017-12-04 MED ORDER — METOPROLOL SUCCINATE ER 50 MG PO TB24
50.0000 mg | ORAL_TABLET | Freq: Two times a day (BID) | ORAL | Status: DC
  Administered 2017-12-04 – 2017-12-06 (×4): 50 mg via ORAL
  Filled 2017-12-04 (×4): qty 1

## 2017-12-04 MED ORDER — ATORVASTATIN CALCIUM 20 MG PO TABS
20.0000 mg | ORAL_TABLET | Freq: Every day | ORAL | Status: DC
  Administered 2017-12-04 – 2017-12-05 (×2): 20 mg via ORAL
  Filled 2017-12-04 (×2): qty 1

## 2017-12-04 MED ORDER — AMLODIPINE BESYLATE 5 MG PO TABS
10.0000 mg | ORAL_TABLET | Freq: Every day | ORAL | Status: DC
  Administered 2017-12-04 – 2017-12-05 (×2): 10 mg via ORAL
  Filled 2017-12-04 (×2): qty 2

## 2017-12-04 MED ORDER — ONDANSETRON HCL 4 MG PO TABS: 4.0000 mg | ORAL_TABLET | Freq: Four times a day (QID) | ORAL | Status: DC | PRN

## 2017-12-04 MED ORDER — HYDROCODONE-ACETAMINOPHEN 5-325 MG PO TABS
1.0000 | ORAL_TABLET | Freq: Two times a day (BID) | ORAL | Status: DC | PRN
  Administered 2017-12-04: 1 via ORAL
  Filled 2017-12-04: qty 1

## 2017-12-04 MED ORDER — POLYETHYLENE GLYCOL 3350 17 G PO PACK: 17.0000 g | PACK | Freq: Every day | ORAL | Status: DC | PRN

## 2017-12-04 MED ORDER — MYCOPHENOLATE SODIUM 180 MG PO TBEC
360.0000 mg | DELAYED_RELEASE_TABLET | Freq: Two times a day (BID) | ORAL | Status: DC
  Administered 2017-12-04 – 2017-12-06 (×4): 360 mg via ORAL
  Filled 2017-12-04 (×5): qty 2

## 2017-12-04 NOTE — ED Provider Notes (Signed)
[facility] Emergency Department Provider Note    ____________________________________________   I have reviewed the triage vital signs and the nursing notes.   HISTORY  Chief Complaint Post-op Problem and Weakness   History limited by: Not Limited   HPI Billy Hughes is a 52 y.o. male who presents to the emergency department today because of concerns for possible dehydration, decreased oral intake since surgery.  Patient had left knee replacement at the end of last month.  Since that time he states he has been quite nauseous anytime he tries to eat or drink.  He has been able to keep down a little bit of soup.  He denies any significant abdominal pain with this.  He denies any true episodes of vomiting.  The patient has not noticed any change in defecation.  The patient does state that he has lost about 20 pounds since the surgery.   Per medical record review patient has a history of kidney transplant  Past Medical History:  Diagnosis Date  . Anemia   . Anxiety   . Cancer ([place])    skin  . Diabetes mellitus without complication ([place])   . Diverticulitis   . Gastric ulcer   . GERD (gastroesophageal reflux disease)   . Hypertension   . Renal disorder     Patient Active Problem List   Diagnosis Date Noted  . S/P total knee arthroplasty 11/12/2017  . Primary osteoarthritis of left knee 05/30/2017  . Hypertension 02/28/2017  . Renal disorder 02/28/2017  . DVT (deep venous thrombosis) ([place]) 02/28/2017  . GERD without esophagitis 02/10/2017  . Hyperlipidemia 02/10/2017  . Recurrent major depressive disorder, in partial remission ([place]) 02/10/2017  . Post concussion syndrome 03/05/2016  . Kidney transplant status 11/05/2011    Past Surgical History:  Procedure Laterality Date  . COLON RESECTION    . COLON SURGERY    . DIALYSIS FISTULA CREATION    . IVC FILTER INSERTION Left 10/27/2017   Procedure: IVC FILTER INSERTION;  Surgeon: Algernon Huxley, MD;  Location: [facility];  Service: Cardiovascular;  Laterality: Left;  . KIDNEY TRANSPLANT    . KNEE ARTHROPLASTY Left 11/12/2017   Procedure: COMPUTER ASSISTED TOTAL KNEE ARTHROPLASTY;  Surgeon: Dereck Leep, MD;  Location: [facility];  Service: Orthopedics;  Laterality: Left;  . NASAL RECONSTRUCTION      Prior to Admission medications   Medication Sig Start Date End Date Taking? Authorizing Provider  albuterol (PROVENTIL HFA;VENTOLIN HFA) 108 (90 Base) MCG/ACT inhaler Inhale 1-2 puffs into the lungs every 6 (six) hours as needed for wheezing or shortness of breath. Patient not taking: Reported on 10/23/2017 03/07/15   Crecencio Mc P, PA-C  amLODipine (NORVASC) 10 MG tablet Take 10 mg by mouth at bedtime.     [provider]  aspirin 81 MG tablet Take 81 mg by mouth daily.    [provider]  atorvastatin (LIPITOR) 10 MG tablet Take 10 mg by mouth daily.     [provider]  cyclobenzaprine (FLEXERIL) 10 MG tablet Take 1 tablet (10 mg total) by mouth 3 (three) times daily as needed for muscle spasms. 11/21/14   Melynda Ripple, MD  fluticasone (FLONASE) 50 MCG/ACT nasal spray Place 2 sprays into both nostrils daily. Patient not taking: Reported on 10/23/2017 02/28/15   Lorin Picket, PA-C  HYDROcodone-acetaminophen (NORCO/VICODIN) 5-325 MG tablet 1-2 tabs po q 8 hours prn Patient taking differently: Take 1 tablet by mouth 2 (two) times  daily as needed for moderate pain.  03/05/16   Norval Gable, MD  Magnesium Oxide 250 MG TABS Take 2 tablets by mouth daily.    [provider]  metFORMIN (GLUCOPHAGE) 500 MG tablet Take 500 mg by mouth daily with breakfast.    [provider]  metoprolol succinate (TOPROL-XL) 25 MG 24 hr tablet Take 50 mg by mouth 2 (two) times daily.    [provider]  mycophenolate (MYFORTIC) 180 MG EC tablet Take 360 mg by mouth 2 (two) times daily.     [provider]  nortriptyline  (PAMELOR) 25 MG capsule Take 25 mg by mouth at bedtime.    [provider]  oxyCODONE (ROXICODONE) 5 MG immediate release tablet Take 1 tablet (5 mg total) by mouth every 8 (eight) hours as needed. Patient not taking: Reported on 10/29/2017 02/10/17 02/10/18  Sherrie George B, FNP  oxyCODONE 10 MG TABS Take 1 tablet (10 mg total) by mouth every 4 (four) hours as needed for severe pain (pain score 7-10). 11/13/17   Watt Climes, PA  oxyCODONE-acetaminophen (PERCOCET) 5-325 MG tablet Take 1 tablet by mouth every 4 (four) hours as needed for severe pain. Patient not taking: Reported on 10/23/2017 06/02/17   Rudene Re, MD  pantoprazole (PROTONIX) 40 MG tablet Take 1 tablet (40 mg total) by mouth 2 (two) times daily. 09/30/17   Earleen Newport, MD  PARoxetine (PAXIL) 20 MG tablet Take 20 mg by mouth at bedtime.     [provider]  rivaroxaban (XARELTO) 20 MG TABS tablet Take 1 tablet (20 mg total) by mouth daily with supper. 06/02/17   Rudene Re, MD  spironolactone (ALDACTONE) 25 MG tablet Take 25 mg by mouth daily.    [provider]  sucralfate (CARAFATE) 1 g tablet Take 1 tablet (1 g total) by mouth 4 (four) times daily. Patient not taking: Reported on 10/23/2017 09/30/17 09/30/18  Earleen Newport, MD  tacrolimus (PROGRAF) 1 MG capsule Take 1 mg by mouth 2 (two) times daily.     [provider]  traMADol (ULTRAM) 50 MG tablet Take 1-2 tablets (50-100 mg total) by mouth every 4 (four) hours as needed for moderate pain. 11/13/17   Watt Climes, PA    Allergies Patient has no known allergies.  Family History  Problem Relation Age of Onset  . Cancer Mother   . Cancer Father     Social History Social History   Tobacco Use  . Smoking status: Current Every Day Smoker    Packs/day: 1.00    Types: Cigarettes  . Smokeless tobacco: Never Used  Substance Use Topics  . Alcohol use: Not Currently    Comment: social  . Drug use: No     Review of Systems Constitutional: No fever/chills.  Positive for weight loss Eyes: No visual changes. ENT: No sore throat. Cardiovascular: Denies chest pain. Respiratory: Denies shortness of breath. Gastrointestinal: No abdominal pain.  Positive for nausea Genitourinary: Negative for dysuria. Musculoskeletal: Positive for left knee pain Skin: Negative for rash. Neurological: Negative for headaches, focal weakness or numbness.  ____________________________________________   PHYSICAL EXAM:  VITAL SIGNS: ED Triage Vitals  Enc Vitals Group     BP 12/04/17 1339 129/63     Pulse Rate 12/04/17 1339 90     Resp 12/04/17 1339 18     Temp 12/04/17 1339 97.9 F (36.6 C)     Temp Source 12/04/17 1339 Oral     SpO2 12/04/17 1339  96 %     Weight 12/04/17 1340 290 lb 9.1 oz (131.8 kg)     Height 12/04/17 1340 6' (1.829 m)     Head Circumference --      Peak Flow --      Pain Score 12/04/17 1340 3   Constitutional: Alert and oriented.  Eyes: Conjunctivae are normal.  ENT      Head: Normocephalic and atraumatic.      Nose: No congestion/rhinnorhea.      Mouth/Throat: Mucous membranes are moist.      Neck: No stridor. Hematological/Lymphatic/Immunilogical: No cervical lymphadenopathy. Cardiovascular: Normal rate, regular rhythm.  No murmurs, rubs, or gallops.  Respiratory: Normal respiratory effort without tachypnea nor retractions. Breath sounds are clear and equal bilaterally. No wheezes/rales/rhonchi. Gastrointestinal: Soft and non tender. No rebound. No guarding.  Genitourinary: Deferred Musculoskeletal: Left knee with vertical incision scar.  No erythema. Neurologic:  Normal speech and language. No gross focal neurologic deficits are appreciated.  Skin:  Skin is warm, dry and intact. No rash noted. Psychiatric: Mood and affect are normal. Speech and behavior are normal. Patient exhibits appropriate insight and judgment.  ____________________________________________     LABS (pertinent positives/negatives)  CBC wbc 11.5, hgb 13.7, plt 566 BMP na 129, k 4.5, glu 208, cr 2.43  ____________________________________________   EKG  I, Nance Pear, attending physician, personally viewed and interpreted this EKG  EKG Time: 1344 Rate: 91 Rhythm: normal sinus rhythm Axis: normal Intervals: qtc 437  QRS: narrow ST changes: no st elevation Impression: normal ekg   ____________________________________________    RADIOLOGY  None  ____________________________________________   PROCEDURES  Procedures  ____________________________________________   INITIAL IMPRESSION / ASSESSMENT AND PLAN / ED COURSE  Pertinent labs & imaging results that were available during my care of the patient were reviewed by me and considered in my medical decision making (see chart for details).   Patient presented to the emergency department today because of concerns for decreased p.o. intake since surgery of his left knee the end of last month.  Patient's blood work is concerning for increased creatinine which would be consistent with dehydration.  Unclear etiology of the patient's nausea but I do wonder if it is related to the pain medications patient started after the left knee surgery.  Will plan on IV fluids.  Will plan on admission.  Discussed plan with patient.   ____________________________________________   FINAL CLINICAL IMPRESSION(S) / ED DIAGNOSES  Final diagnoses:  AKI (acute kidney injury) ([place])  Dehydration     Note: This dictation was prepared with Dragon dictation. Any transcriptional errors that result from this process are unintentional     Nance Pear, MD 12/04/17 [id number]

## 2017-12-04 NOTE — ED Triage Notes (Signed)
Pt presents to ED via POV with c/o lack of appetite,lack of wanting to drink, and generalized weakness since having L knee replacement on 10/30. Pt reports approx 20lb weight loss since surgery.

## 2017-12-04 NOTE — H&P (Signed)
[place] [facility] [place] NAME: Billy Hughes    MR#:  [id number]  DATE OF BIRTH:  [birth year]  DATE OF ADMISSION:  12/04/2017  PRIMARY CARE PHYSICIAN: Sofie Hartigan, MD   REQUESTING/REFERRING PHYSICIAN: dr Archie Balboa  CHIEF COMPLAINT:   Weakness lack of appetite HISTORY OF PRESENT ILLNESS:  Billy Hughes  is a 52 y.o. male with a known history of recent left knee replacement at the end of October who presents to the ED with above complaint. Patient reports since his surgery he has decreased appetitie and recently generalized weakness. He is also complaining of nausea when he eats/drinks. He denies abdominal pain, constipation or diarrhea.  PAST MEDICAL HISTORY:   Past Medical History:  Diagnosis Date  . Anemia   . Anxiety   . Cancer ([place])    skin  . Diabetes mellitus without complication ([place])   . Diverticulitis   . Gastric ulcer   . GERD (gastroesophageal reflux disease)   . Hypertension   . Renal disorder     PAST SURGICAL HISTORY:   Past Surgical History:  Procedure Laterality Date  . COLON RESECTION    . COLON SURGERY    . DIALYSIS FISTULA CREATION    . IVC FILTER INSERTION Left 10/27/2017   Procedure: IVC FILTER INSERTION;  Surgeon: Algernon Huxley, MD;  Location: [facility];  Service: Cardiovascular;  Laterality: Left;  . KIDNEY TRANSPLANT    . KNEE ARTHROPLASTY Left 11/12/2017   Procedure: COMPUTER ASSISTED TOTAL KNEE ARTHROPLASTY;  Surgeon: Dereck Leep, MD;  Location: [facility];  Service: Orthopedics;  Laterality: Left;  . NASAL RECONSTRUCTION      SOCIAL HISTORY:   Social History   Tobacco Use  . Smoking status: Current Every Day Smoker    Packs/day: 1.00    Types: Cigarettes  . Smokeless tobacco: Never Used  Substance Use Topics  . Alcohol use: Not Currently    Comment: social    FAMILY HISTORY:   Family History  Problem Relation Age of Onset  . Cancer Mother   . Cancer Father      DRUG ALLERGIES:  No Known Allergies  REVIEW OF SYSTEMS:   Review of Systems  Constitutional: Positive for malaise/fatigue. Negative for chills and fever.  HENT: Negative.  Negative for ear discharge, ear pain, hearing loss, nosebleeds and sore throat.   Eyes: Negative.  Negative for blurred vision and pain.  Respiratory: Negative.  Negative for cough, hemoptysis, shortness of breath and wheezing.   Cardiovascular: Negative.  Negative for chest pain, palpitations and leg swelling.  Gastrointestinal: Positive for nausea. Negative for abdominal pain, blood in stool, diarrhea and vomiting.  Genitourinary: Negative.  Negative for dysuria.  Musculoskeletal: Negative.  Negative for back pain.  Skin: Negative.   Neurological: Negative for dizziness, tremors, speech change, focal weakness, seizures and headaches.  Endo/Heme/Allergies: Negative.  Does not bruise/bleed easily.  Psychiatric/Behavioral: Negative.  Negative for depression, hallucinations and suicidal ideas.    MEDICATIONS AT HOME:   Prior to Admission medications   Medication Sig Start Date End Date Taking? Authorizing Provider  albuterol (PROVENTIL HFA;VENTOLIN HFA) 108 (90 Base) MCG/ACT inhaler Inhale 1-2 puffs into the lungs every 6 (six) hours as needed for wheezing or shortness of breath.   Yes [provider]  amLODipine (NORVASC) 10 MG tablet Take 10 mg by mouth at bedtime.    Yes [provider]  aspirin EC 81 MG tablet Take 81 mg by mouth  at bedtime.   Yes [provider]  atorvastatin (LIPITOR) 20 MG tablet Take 20 mg by mouth at bedtime.   Yes [provider]  HYDROcodone-acetaminophen (NORCO/VICODIN) 5-325 MG tablet 1-2 tabs po q 8 hours prn Patient taking differently: Take 1 tablet by mouth 2 (two) times daily as needed for moderate pain.  03/05/16  Yes Norval Gable, MD  Magnesium Oxide 250 MG TABS Take 500 mg by mouth daily.    Yes [provider]  metoprolol  succinate (TOPROL-XL) 25 MG 24 hr tablet Take 50 mg by mouth 2 (two) times daily.   Yes [provider]  mycophenolate (MYFORTIC) 180 MG EC tablet Take 360 mg by mouth 2 (two) times daily.    Yes [provider]  nortriptyline (PAMELOR) 25 MG capsule Take 25 mg by mouth at bedtime.   Yes [provider]  pantoprazole (PROTONIX) 40 MG tablet Take 1 tablet (40 mg total) by mouth 2 (two) times daily. 09/30/17  Yes Earleen Newport, MD  PARoxetine (PAXIL) 20 MG tablet Take 20 mg by mouth at bedtime.    Yes [provider]  rivaroxaban (XARELTO) 20 MG TABS tablet Take 1 tablet (20 mg total) by mouth daily with supper. Patient taking differently: Take 20 mg by mouth at bedtime.  06/02/17  Yes [place], [place], MD  spironolactone (ALDACTONE) 25 MG tablet Take 25 mg by mouth daily.   Yes [provider]  tacrolimus (PROGRAF) 1 MG capsule Take 1 mg by mouth 2 (two) times daily.    Yes [provider]  traMADol (ULTRAM) 50 MG tablet Take 1-2 tablets (50-100 mg total) by mouth every 4 (four) hours as needed for moderate pain. 11/13/17  Yes Watt Climes, PA  oxyCODONE 10 MG TABS Take 1 tablet (10 mg total) by mouth every 4 (four) hours as needed for severe pain (pain score 7-10). Patient not taking: Reported on 12/04/2017 11/13/17   Watt Climes, PA  sucralfate (CARAFATE) 1 g tablet Take 1 tablet (1 g total) by mouth 4 (four) times daily. Patient not taking: Reported on 10/23/2017 09/30/17 09/30/18  Earleen Newport, MD      VITAL SIGNS:  Blood pressure 129/63, pulse 90, temperature 97.9 F (36.6 C), temperature source Oral, resp. rate 18, height 6' (1.829 m), weight 131.8 kg, SpO2 96 %.  PHYSICAL EXAMINATION:   Physical Exam  Constitutional: He is oriented to person, place, and time. No distress.  HENT:  Head: Normocephalic.  Eyes: No scleral icterus.  Neck: Normal range of motion. Neck supple. No JVD present. No tracheal deviation present.   Cardiovascular: Normal rate, regular rhythm and normal heart sounds. Exam reveals no gallop and no friction rub.  No murmur heard. Pulmonary/Chest: Effort normal and breath sounds normal. No respiratory distress. He has no wheezes. He has no rales. He exhibits no tenderness.  Abdominal: Soft. Bowel sounds are normal. He exhibits no distension and no mass. There is no tenderness. There is no rebound and no guarding.  Musculoskeletal: Normal range of motion. He exhibits no edema.  Neurological: He is alert and oriented to person, place, and time.  Skin: Skin is warm. No rash noted. No erythema.  Psychiatric: He has a normal mood and affect. Judgment normal.      LABORATORY PANEL:   CBC Recent Labs  Lab 12/04/17 1341  WBC 11.5*  HGB 13.7  HCT 43.1  PLT 566*   ------------------------------------------------------------------------------------------------------------------  Chemistries  Recent Labs  Lab 12/04/17 1341  NA 129*  K 4.5  CL 94*  CO2 22  GLUCOSE 208*  BUN 34*  CREATININE 2.43*  CALCIUM 10.2   ------------------------------------------------------------------------------------------------------------------  Cardiac Enzymes No results for input(s): TROPONINI in the last 168 hours. ------------------------------------------------------------------------------------------------------------------  RADIOLOGY:  No results found.  EKG:  NSR no ST elevation/depression   IMPRESSION AND PLAN:   52 y/o male with recent left knee replacement 10/30 and kidney transplant on Prograf and Mycophenolate  presenting to the ED with generalized weakness and poor appetitie.  1. AKI in the setting of poor oral intake. Start IVF  BMP for am Renal ultrasound due to AKI in patient with renal transplant Check levels for prograf and mycophenolate Renal consult placed via [place]. Hold nephrotoxoc medications including Aldactone for now.  2. Nausea: This may be from acute  kidney injury however will check KUB  3. generalaized weakness likley from above issues: PT evaluation  4. Hx of kidney transplant: check levels as mentioned above Nephrology consult placed  5. Hyponatremia from dehydration: Start IVF and recheck BMP in am and check TSH.  6. HTN: Continue Norvasc and metoprolol and monitor BP  7. HLD: continue statin  8. Tobacco dependence: Patient is encouraged to quit smoking. Counseling was provided for 4 minutes.   All the records are reviewed and case discussed with ED provider. Management plans discussed with the patient and he is in agreement  CODE STATUS: FULL  TOTAL TIME TAKING CARE OF THIS PATIENT: 47 minutes.    Jayah Balthazar M.D on 12/04/2017 at 5:50 PM  Between 7am to 6pm - Pager - [phone]  After 6pm go to [email or web address] - password [email or web address] [facility]  Office  [phone]  CC: Primary care physician; Sofie Hartigan, MD

## 2017-12-05 LAB — CBC
HCT: 35.6 % — ABNORMAL LOW (ref 39.0–52.0)
Hemoglobin: 11.5 g/dL — ABNORMAL LOW (ref 13.0–17.0)
MCH: 27.9 pg (ref 26.0–34.0)
MCHC: 32.3 g/dL (ref 30.0–36.0)
MCV: 86.4 fL (ref 80.0–100.0)
Platelets: 427 10*3/uL — ABNORMAL HIGH (ref 150–400)
RBC: 4.12 MIL/uL — ABNORMAL LOW (ref 4.22–5.81)
RDW: 14.3 % (ref 11.5–15.5)
WBC: 7.6 10*3/uL (ref 4.0–10.5)
nRBC: 0 % (ref 0.0–0.2)

## 2017-12-05 LAB — BASIC METABOLIC PANEL
Anion gap: 9 (ref 5–15)
BUN: 32 mg/dL — AB (ref 6–20)
CHLORIDE: 102 mmol/L (ref 98–111)
CO2: 22 mmol/L (ref 22–32)
CREATININE: 2.17 mg/dL — AB (ref 0.61–1.24)
Calcium: 9.5 mg/dL (ref 8.9–10.3)
GFR calc Af Amer: 38 mL/min — ABNORMAL LOW (ref >60)
GFR calc non Af Amer: 33 mL/min — ABNORMAL LOW (ref >60)
GLUCOSE: 165 mg/dL — AB (ref 70–99)
Potassium: 4.7 mmol/L (ref 3.5–5.1)
SODIUM: 133 mmol/L — AB (ref 135–145)

## 2017-12-05 LAB — GLUCOSE, CAPILLARY
GLUCOSE-CAPILLARY: 150 mg/dL — AB (ref 70–99)
Glucose-Capillary: 107 mg/dL — ABNORMAL HIGH (ref 70–99)
Glucose-Capillary: 164 mg/dL — ABNORMAL HIGH (ref 70–99)
Glucose-Capillary: 146 mg/dL — ABNORMAL HIGH (ref 70–99)

## 2017-12-05 MED ORDER — HYDROCODONE-ACETAMINOPHEN 5-325 MG PO TABS
1.0000 | ORAL_TABLET | Freq: Four times a day (QID) | ORAL | Status: DC | PRN
  Administered 2017-12-05 – 2017-12-06 (×3): 1 via ORAL
  Filled 2017-12-05 (×4): qty 1

## 2017-12-05 MED ORDER — PHENOL 1.4 % MT LIQD
1.0000 | OROMUCOSAL | Status: DC | PRN
  Administered 2017-12-05: 1 via OROMUCOSAL
  Filled 2017-12-05: qty 177

## 2017-12-05 MED ORDER — ADULT MULTIVITAMIN W/MINERALS CH
1.0000 | ORAL_TABLET | Freq: Every day | ORAL | Status: DC
  Administered 2017-12-05 – 2017-12-06 (×2): 1 via ORAL
  Filled 2017-12-05 (×2): qty 1

## 2017-12-05 MED ORDER — ENSURE ENLIVE PO LIQD: 237.0000 mL | Freq: Two times a day (BID) | ORAL | Status: DC

## 2017-12-05 MED ORDER — MENTHOL 3 MG MT LOZG
1.0000 | LOZENGE | OROMUCOSAL | Status: DC | PRN
  Filled 2017-12-05: qty 9

## 2017-12-05 MED ORDER — HYDROMORPHONE HCL 1 MG/ML IJ SOLN
1.0000 mg | INTRAMUSCULAR | Status: DC | PRN
  Administered 2017-12-05 (×3): 1 mg via INTRAVENOUS
  Filled 2017-12-05 (×3): qty 1

## 2017-12-05 MED ORDER — MAGNESIUM OXIDE 400 (241.3 MG) MG PO TABS
400.0000 mg | ORAL_TABLET | Freq: Every day | ORAL | Status: DC
  Administered 2017-12-05 – 2017-12-06 (×2): 400 mg via ORAL
  Filled 2017-12-05 (×2): qty 1

## 2017-12-05 MED ORDER — HYDROMORPHONE HCL 1 MG/ML IJ SOLN
1.0000 mg | Freq: Once | INTRAMUSCULAR | Status: AC
  Administered 2017-12-05: 1 mg via INTRAVENOUS
  Filled 2017-12-05: qty 1

## 2017-12-05 NOTE — Progress Notes (Addendum)
Initial Nutrition Assessment  DOCUMENTATION CODES:   Obesity unspecified  INTERVENTION:   Ensure Enlive po BID, each supplement provides 350 kcal and 20 grams of protein  MVI daily  Bowel regimen per MD  NUTRITION DIAGNOSIS:   Inadequate oral intake related to acute illness as evidenced by per patient/family report.  GOAL:   Patient will meet greater than or equal to 90% of their needs  MONITOR:   PO intake, Supplement acceptance, Labs, Weight trends, Skin, I & O's  REASON FOR ASSESSMENT:   Malnutrition Screening Tool    ASSESSMENT:   52 y/o male with recent left knee replacement 10/30 and kidney transplant on Prograf and Mycophenolate  presenting to the ED with generalized weakness and poor appetitie.   Met with pt in room today. Pt reports nausea, poor appetite and oral intake for the past 3 weeks r/t recent knee surgery. Pt reports that he has no desire to eat or drink. Pt reports his appetite has remained poor today. Pt reports eating a few bites of a hamburger and some fries for lunch today. Pt documented to have eaten 100% of his lunch tray. Pt reports a 20lb weight loss in 3 weeks; pt reports his UBW is ~275. Pt weighed 288lbs in August, 288lbs in September, 279lbs in October and 261lbs on 11/14. It appears pt has lost ~27lbs(9%) over the past 2 months which is significant assuming that November weight is acurate. Pt reports he has been drinking some Ensure. RD discussed with pt the importance of adequate nutrition needed for healing. RD will order supplements and MVI to help pt meet his estimated needs. Last BM noted was type 2; recommend bowel regimen per MD.   Medications reviewed and include: aspirin, insulin, Mg oxide, protonix, paxil, NaCl '@100ml' /hr  Labs reviewed: Na 133(L), BUN 32(H), creat 2.17(H)  NUTRITION - FOCUSED PHYSICAL EXAM:    Most Recent Value  Orbital Region  No depletion  Upper Arm Region  No depletion  Thoracic and Lumbar Region  No depletion   Buccal Region  No depletion  Temple Region  No depletion  Clavicle Bone Region  No depletion  Clavicle and Acromion Bone Region  No depletion  Scapular Bone Region  No depletion  Dorsal Hand  No depletion  Patellar Region  No depletion  Anterior Thigh Region  No depletion  Posterior Calf Region  No depletion  Edema (RD Assessment)  Mild  Hair  Reviewed  Eyes  Reviewed  Mouth  Reviewed  Skin  Reviewed  Nails  Reviewed     Diet Order:   Diet Order            Diet regular Room service appropriate? Yes; Fluid consistency: Thin  Diet effective now             EDUCATION NEEDS:   Education needs have been addressed  Skin:  Skin Assessment: Reviewed RN Assessment(incision L knee )  Last BM:  11/22- type 2  Height:   Ht Readings from Last 1 Encounters:  12/04/17 6' (1.829 m)    Weight:   Wt Readings from Last 1 Encounters:  12/04/17 131.8 kg    Ideal Body Weight:  80.9 kg  BMI:  Body mass index is 39.41 kg/m.  Estimated Nutritional Needs:   Kcal:  2400-2700kcal/day   Protein:  >127g/day   Fluid:  >2.4L/day   Koleen Distance MS, RD, LDN Pager #- [phone] Office#- [phone] After Hours Pager: [phone]

## 2017-12-05 NOTE — Evaluation (Signed)
Physical Therapy Evaluation Patient Details Name: Billy Hughes MRN: [id number] DOB: 08/23/1965 Today's Date: 12/05/2017   History of Present Illness  Patient is a 52 year old male admitted to [facility] due to dehydration, nausea, inability eat, recent TKA 11/12/17   Clinical Impression  Patient demonstrates independence with bed mobility and transfers, ambulated 200 feet with rw and supervision. Patient demonstrates decreased cadence and general weakness due to poor appetite and dehydration. Will benefit from continued PT on outpatient level to work on TKA rehab.    Follow Up Recommendations Outpatient PT    Equipment Recommendations       Recommendations for Other Services   OPPT    Precautions / Restrictions Precautions Precautions: None Restrictions Weight Bearing Restrictions: No      Mobility  Bed Mobility Overal bed mobility: Independent             General bed mobility comments: independent  Transfers Overall transfer level: Independent Equipment used: Rolling walker (2 wheeled) Transfers: Sit to/from Stand Sit to Stand: Supervision            Ambulation/Gait Ambulation/Gait assistance: Supervision Gait Distance (Feet): 200 Feet Assistive device: Rolling walker (2 wheeled) Gait Pattern/deviations: WFL(Within Functional Limits)     General Gait Details: Patient ambulating with good confidence, decreased cadence due to weakness   Stairs   Stairs assistance: Supervision        Wheelchair Mobility    Modified Rankin (Stroke Patients Only)       Balance Overall balance assessment: Modified Independent Sitting-balance support: Feet supported;No upper extremity supported Sitting balance-Leahy Scale: Good       Standing balance-Leahy Scale: Good                               Pertinent Vitals/Pain Pain Assessment: No/denies pain    Home Living Family/patient expects to be discharged to:: Private residence Living  Arrangements: Spouse/significant other Available Help at Discharge: Family Type of Home: House Home Access: Stairs to enter Entrance Stairs-Rails: Can reach both Entrance Stairs-Number of Steps: 4 Home Layout: Two level Home Equipment: Environmental consultant - 2 wheels;Cane - single point      Prior Function Level of Independence: Independent         Comments: Patient was independent prior to TKA, was using RW, just recently progressed to cane. Not driving yet since TKA.      Hand Dominance        Extremity/Trunk Assessment        Lower Extremity Assessment Lower Extremity Assessment: Overall WFL for tasks assessed       Communication   Communication: No difficulties  Cognition Arousal/Alertness: Awake/alert Behavior During Therapy: WFL for tasks assessed/performed Overall Cognitive Status: Within Functional Limits for tasks assessed                                        General Comments      Exercises Total Joint Exercises Quad Sets: AROM;10 reps Heel Slides: AROM;10 reps Straight Leg Raises: AROM;10 reps Long Arc Quad: AROM;10 reps   Assessment/Plan    PT Assessment Patient needs continued PT services;All further PT needs can be met in the next venue of care  PT Problem List Decreased strength;Decreased activity tolerance;Decreased range of motion       PT Treatment Interventions Therapeutic activities;Gait training;Therapeutic exercise;Functional mobility training  PT Goals (Current goals can be found in the Care Plan section)  Acute Rehab PT Goals Patient Stated Goal: to get back to work and travel with spouse PT Goal Formulation: With patient Time For Goal Achievement: 12/12/17 Potential to Achieve Goals: Good    Frequency Min 2X/week   Barriers to discharge   none    Co-evaluation               AM-PAC PT "6 Clicks" Daily Activity  Outcome Measure Difficulty turning over in bed (including adjusting bedclothes, sheets and  blankets)?: None Difficulty moving from lying on back to sitting on the side of the bed? : None Difficulty sitting down on and standing up from a chair with arms (e.g., wheelchair, bedside commode, etc,.)?: None Help needed moving to and from a bed to chair (including a wheelchair)?: None Help needed walking in hospital room?: None Help needed climbing 3-5 steps with a railing? : None 6 Click Score: 24    End of Session Equipment Utilized During Treatment: Gait belt Activity Tolerance: Patient tolerated treatment well Patient left: in bed Nurse Communication: Mobility status PT Visit Diagnosis: Muscle weakness (generalized) (M62.81);Difficulty in walking, not elsewhere classified (R26.2) Pain - Right/Left: Left Pain - part of body: Knee    Time: 1130-1147 PT Time Calculation (min) (ACUTE ONLY): 17 min   Charges:   PT Evaluation $PT Eval Low Complexity: 1 Low PT Treatments $Gait Training: 8-22 mins        Tulsi Crossett, PT, GCS 12/05/17,12:32 PM

## 2017-12-05 NOTE — Consult Note (Signed)
[facility]  CONSULT NOTE    Date: 12/05/2017                  Patient Name:  Billy Hughes  MRN: [id number]  DOB: [birth year]  Age / Sex: 52 y.o., male         PCP: Sofie Hartigan, MD                 Service Requesting Consult: Dr. Benjie Karvonen                 Reason for Consult: Acute renal failure            History of Present Illness: Billy Hughes is a 52 y.o. white male with living donor renal transplant in 2009, who was admitted to [facility] on 12/04/2017 for Hydronephrosis [N13.30] Dehydration [E86.0] Nausea & vomiting [R11.2] AKI (acute kidney injury) [facility]) [N17.9]  Patient states that he has not been eating well since his left knee surgery on 10/30 by Dr. Marry Guan.   Placed on NS at 138mL/hr   Medications: Outpatient medications: Medications Prior to Admission  Medication Sig Dispense Refill Last Dose  . albuterol (PROVENTIL HFA;VENTOLIN HFA) 108 (90 Base) MCG/ACT inhaler Inhale 1-2 puffs into the lungs every 6 (six) hours as needed for wheezing or shortness of breath.   unknown at unknown  . amLODipine (NORVASC) 10 MG tablet Take 10 mg by mouth at bedtime.    12/03/2017 at pm  . aspirin EC 81 MG tablet Take 81 mg by mouth at bedtime.   12/03/2017 at pm  . atorvastatin (LIPITOR) 20 MG tablet Take 20 mg by mouth at bedtime.   12/03/2017 at pm  . HYDROcodone-acetaminophen (NORCO/VICODIN) 5-325 MG tablet 1-2 tabs po q 8 hours prn (Patient taking differently: Take 1 tablet by mouth 2 (two) times daily as needed for moderate pain. ) 10 tablet 0  at unknown  . Magnesium Oxide 250 MG TABS Take 500 mg by mouth daily.    12/04/2017 at am  . metoprolol succinate (TOPROL-XL) 25 MG 24 hr tablet Take 50 mg by mouth 2 (two) times daily.   12/04/2017 at 1000  . mycophenolate (MYFORTIC) 180 MG EC tablet Take 360 mg by mouth 2 (two) times daily.    12/04/2017 at am  . nortriptyline (PAMELOR) 25 MG capsule Take 25 mg by mouth at bedtime.   12/03/2017 at  pm  . pantoprazole (PROTONIX) 40 MG tablet Take 1 tablet (40 mg total) by mouth 2 (two) times daily. 60 tablet 1 12/04/2017 at am  . PARoxetine (PAXIL) 20 MG tablet Take 20 mg by mouth at bedtime.    12/03/2017 at pm  . rivaroxaban (XARELTO) 20 MG TABS tablet Take 1 tablet (20 mg total) by mouth daily with supper. (Patient taking differently: Take 20 mg by mouth at bedtime. ) 30 tablet 2 12/03/2017 at 2100  . spironolactone (ALDACTONE) 25 MG tablet Take 25 mg by mouth daily.   12/04/2017 at am  . tacrolimus (PROGRAF) 1 MG capsule Take 1 mg by mouth 2 (two) times daily.    12/04/2017 at am  . traMADol (ULTRAM) 50 MG tablet Take 1-2 tablets (50-100 mg total) by mouth every 4 (four) hours as needed for moderate pain. 60 tablet 0 unknown at unknown  . oxyCODONE 10 MG TABS Take 1 tablet (10 mg total) by mouth every 4 (four) hours as needed for severe pain (pain score 7-10). (Patient not taking: Reported on 12/04/2017)  30 tablet 0 Not Taking at Unknown time  . sucralfate (CARAFATE) 1 g tablet Take 1 tablet (1 g total) by mouth 4 (four) times daily. (Patient not taking: Reported on 10/23/2017) 120 tablet 1 Not Taking at Unknown time    Current medications: Current Facility-Administered Medications  Medication Dose Route Frequency Provider Last Rate Last Dose  . 0.9 %  sodium chloride infusion   Intravenous Continuous Bettey Costa, MD 100 mL/hr at 12/05/17 0518    . acetaminophen (TYLENOL) tablet 650 mg  650 mg Oral Q6H PRN Bettey Costa, MD       Or  . acetaminophen (TYLENOL) suppository 650 mg  650 mg Rectal Q6H PRN Mody, Sital, MD      . amLODipine (NORVASC) tablet 10 mg  10 mg Oral QHS Bettey Costa, MD   10 mg at 12/04/17 2251  . aspirin EC tablet 81 mg  81 mg Oral QHS Bettey Costa, MD   81 mg at 12/04/17 2251  . atorvastatin (LIPITOR) tablet 20 mg  20 mg Oral QHS Bettey Costa, MD   20 mg at 12/04/17 2251  . HYDROcodone-acetaminophen (NORCO/VICODIN) 5-325 MG per tablet 1 tablet  1 tablet Oral BID PRN  Bettey Costa, MD   1 tablet at 12/04/17 2023  . HYDROmorphone (DILAUDID) injection 1 mg  1 mg Intravenous Q4H PRN Amelia Jo, MD   1 mg at 12/05/17 1103  . insulin aspart (novoLOG) injection 0-9 Units  0-9 Units Subcutaneous TID WC Bettey Costa, MD   2 Units at 12/05/17 1238  . magnesium oxide (MAG-OX) tablet 400 mg  400 mg Oral Daily Fritzi Mandes, MD      . menthol-cetylpyridinium (CEPACOL) lozenge 3 mg  1 lozenge Oral PRN Arta Silence, MD      . metoprolol succinate (TOPROL-XL) 24 hr tablet 50 mg  50 mg Oral BID Bettey Costa, MD   50 mg at 12/05/17 0944  . mycophenolate (MYFORTIC) EC tablet 360 mg  360 mg Oral BID Bettey Costa, MD   360 mg at 12/05/17 0944  . nortriptyline (PAMELOR) capsule 25 mg  25 mg Oral QHS Bettey Costa, MD   25 mg at 12/04/17 2251  . ondansetron (ZOFRAN) tablet 4 mg  4 mg Oral Q6H PRN Mody, Sital, MD       Or  . ondansetron (ZOFRAN) injection 4 mg  4 mg Intravenous Q6H PRN Mody, Sital, MD      . pantoprazole (PROTONIX) EC tablet 40 mg  40 mg Oral BID Bettey Costa, MD   40 mg at 12/05/17 0944  . PARoxetine (PAXIL) tablet 20 mg  20 mg Oral QHS Bettey Costa, MD   20 mg at 12/04/17 2250  . phenol (CHLORASEPTIC) mouth spray 1 spray  1 spray Mouth/Throat PRN Mody, Sital, MD      . polyethylene glycol (MIRALAX / GLYCOLAX) packet 17 g  17 g Oral Daily PRN Mody, Sital, MD      . rivaroxaban (XARELTO) tablet 20 mg  20 mg Oral QHS Bettey Costa, MD   20 mg at 12/04/17 2251  . tacrolimus (PROGRAF) capsule 1 mg  1 mg Oral BID Bettey Costa, MD   1 mg at 12/05/17 0944      Allergies: No Known Allergies    Past Medical History: Past Medical History:  Diagnosis Date  . Anemia   . Anxiety   . Cancer ([place])    skin  . Diabetes mellitus without complication ([place])   . Diverticulitis   . Gastric  ulcer   . GERD (gastroesophageal reflux disease)   . Hypertension   . Renal disorder      Past Surgical History: Past Surgical History:  Procedure Laterality Date  . COLON  RESECTION    . COLON SURGERY    . DIALYSIS FISTULA CREATION    . IVC FILTER INSERTION Left 10/27/2017   Procedure: IVC FILTER INSERTION;  Surgeon: Algernon Huxley, MD;  Location: [facility];  Service: Cardiovascular;  Laterality: Left;  . KIDNEY TRANSPLANT    . KNEE ARTHROPLASTY Left 11/12/2017   Procedure: COMPUTER ASSISTED TOTAL KNEE ARTHROPLASTY;  Surgeon: Dereck Leep, MD;  Location: [facility];  Service: Orthopedics;  Laterality: Left;  . NASAL RECONSTRUCTION       Family History: Family History  Problem Relation Age of Onset  . Cancer Mother   . Cancer Father      Social History: Social History   Socioeconomic History  . Marital status: Married    Spouse name: Not on file  . Number of children: Not on file  . Years of education: Not on file  . Highest education level: Not on file  Occupational History  . Not on file  Social Needs  . Financial resource strain: Not on file  . Food insecurity:    Worry: Not on file    Inability: Not on file  . Transportation needs:    Medical: Not on file    Non-medical: Not on file  Tobacco Use  . Smoking status: Current Every Day Smoker    Packs/day: 1.00    Types: Cigarettes  . Smokeless tobacco: Never Used  Substance and Sexual Activity  . Alcohol use: Not Currently    Comment: social  . Drug use: No  . Sexual activity: Not on file  Lifestyle  . Physical activity:    Days per week: Not on file    Minutes per session: Not on file  . Stress: Not on file  Relationships  . Social connections:    Talks on phone: Not on file    Gets together: Not on file    Attends religious service: Not on file    Active member of club or organization: Not on file    Attends meetings of clubs or organizations: Not on file    Relationship status: Not on file  . Intimate partner violence:    Fear of current or ex partner: Not on file    Emotionally abused: Not on file    Physically abused: Not on file    Forced sexual  activity: Not on file  Other Topics Concern  . Not on file  Social History Narrative  . Not on file     Review of Systems: Review of Systems  Constitutional: Negative.  Negative for chills, diaphoresis, fever, malaise/fatigue and weight loss.  HENT: Negative.  Negative for congestion, ear discharge, ear pain, hearing loss, nosebleeds, sinus pain, sore throat and tinnitus.   Eyes: Negative.  Negative for blurred vision, double vision, photophobia, pain, discharge and redness.  Respiratory: Negative.  Negative for cough, hemoptysis, sputum production, shortness of breath, wheezing and stridor.   Cardiovascular: Negative.  Negative for chest pain, palpitations, orthopnea, claudication, leg swelling and PND.  Gastrointestinal: Positive for abdominal pain, nausea and vomiting. Negative for blood in stool, constipation, diarrhea, heartburn and melena.  Genitourinary: Negative.  Negative for dysuria, flank pain, frequency, hematuria and urgency.  Musculoskeletal: Negative.  Negative for back pain, falls, joint pain, myalgias and neck  pain.  Skin: Negative.  Negative for itching and rash.  Neurological: Negative.  Negative for dizziness, tingling, tremors, sensory change, speech change, focal weakness, seizures, loss of consciousness, weakness and headaches.  Endo/Heme/Allergies: Negative.  Negative for environmental allergies and polydipsia. Does not bruise/bleed easily.  Psychiatric/Behavioral: Negative.  Negative for depression, hallucinations, memory loss, substance abuse and suicidal ideas. The patient is not nervous/anxious and does not have insomnia.     Vital Signs: Blood pressure 130/76, pulse 70, temperature 97.8 F (36.6 C), temperature source Oral, resp. rate 16, height 6' (1.829 m), weight 131.8 kg, SpO2 98 %.  Weight trends: Filed Weights   12/04/17 1340  Weight: 131.8 kg    Physical Exam: General: NAD,   Head: Normocephalic, atraumatic. Moist oral mucosal membranes  Eyes:  Anicteric, PERRL  Neck: Supple, trachea midline  Lungs:  Clear to auscultation  Heart: Regular rate and rhythm  Abdomen:  Soft, nontender, +allograft kidney right lower quadrant  Extremities: No peripheral edema, Left knee incision clean and dry  Neurologic: Nonfocal, moving all four extremities  Skin: No lesions  Access: Left AVF     Lab results: Basic Metabolic Panel: Recent Labs  Lab 12/04/17 1341 12/05/17 0437  NA 129* 133*  K 4.5 4.7  CL 94* 102  CO2 22 22  GLUCOSE 208* 165*  BUN 34* 32*  CREATININE 2.43* 2.17*  CALCIUM 10.2 9.5    Liver Function Tests: No results for input(s): AST, ALT, ALKPHOS, BILITOT, PROT, ALBUMIN in the last 168 hours. No results for input(s): LIPASE, AMYLASE in the last 168 hours. No results for input(s): AMMONIA in the last 168 hours.  CBC: Recent Labs  Lab 12/04/17 1341 12/05/17 0437  WBC 11.5* 7.6  HGB 13.7 11.5*  HCT 43.1 35.6*  MCV 87.1 86.4  PLT 566* 427*    Cardiac Enzymes: No results for input(s): CKTOTAL, CKMB, CKMBINDEX, TROPONINI in the last 168 hours.  BNP: Invalid input(s): POCBNP  CBG: Recent Labs  Lab 12/04/17 1716 12/04/17 2052 12/05/17 0746 12/05/17 1157  GLUCAP 112* 186* 107* 164*    Microbiology: Results for orders placed or performed during the hospital encounter of 10/29/17  Urine culture     Status: None   Collection Time: 10/29/17  7:45 AM  Result Value Ref Range Status   Specimen Description   Final    URINE, RANDOM Performed at [facility], [address] [place], [place] [address]    Special Requests   Final    Normal Performed at [facility], [address] [place], [place] [address]    Culture   Final    NO GROWTH Performed at [facility], [place] [address] [place], [place] [address]    Report Status 10/30/2017 FINAL  Final  Surgical pcr screen     Status: None   Collection Time: 10/29/17  9:05 AM  Result Value Ref Range Status   MRSA, PCR NEGATIVE  NEGATIVE Final   Staphylococcus aureus NEGATIVE NEGATIVE Final    Comment: (NOTE) The Xpert SA Assay (FDA approved for NASAL specimens in patients 67 years of age and older), is one component of a comprehensive surveillance program. It is not intended to diagnose infection nor to guide or monitor treatment. Performed at [facility], [address] [place], [place] [address]     Coagulation Studies: No results for input(s): LABPROT, INR in the last 72 hours.  Urinalysis: Recent Labs    12/04/17 [place]  1.010  PHURINE 5.0  GLUCOSEU NEGATIVE  HGBUR NEGATIVE  BILIRUBINUR NEGATIVE  KETONESUR NEGATIVE  PROTEINUR NEGATIVE  NITRITE NEGATIVE  LEUKOCYTESUR NEGATIVE      Imaging: Dg Abd 1 View  Result Date: 12/04/2017 CLINICAL DATA:  Dehydration, decreased oral intake.  Unable to eat. EXAM: ABDOMEN - 1 VIEW COMPARISON:  CT scan 04/11/2014 FINDINGS: The bowel gas pattern is unremarkable. There is scattered air and stool throughout the colon and down into the rectum. There are also air-filled loops of small bowel in the mid and left abdomen but no distension. No free air. The soft tissue shadows are maintained. An IVC filter is noted. The bony structures are intact. The left lung base is grossly clear. IMPRESSION: Unremarkable abdominal radiographs. Electronically Signed   By: Marijo Sanes M.D.   On: 12/04/2017 18:37   US Renal  Result Date: 12/04/2017 CLINICAL DATA:  Transplant kidney.  RIGHT renal pelvic transplant. EXAM: RENAL / URINARY TRACT ULTRASOUND COMPLETE COMPARISON:  None. FINDINGS: Native kidneys difficult to visualize. Transplant kidney: 11.4 x 8.0 x 6.8 cm. No hydronephrosis. Small anechoic cysts measuring 1 cm in the mid renal cortex. Bladder: Appears normal for degree of bladder distention. IMPRESSION: 1. No hydronephrosis or obstruction of the transplant RIGHT pelvic kidney which is normal volume. 2. Atrophic native kidneys difficult  to visualize. Electronically Signed   By: Suzy Bouchard M.D.   On: 12/04/2017 19:29      Assessment & Plan: Billy Hughes is a 52 y.o. white male with living donor renal transplant in 2009, who was admitted to [facility] on 12/04/2017 for Hydronephrosis [N13.30] Dehydration [E86.0] Nausea & vomiting [R11.2] AKI (acute kidney injury) ([place]) [N17.9]  1. Acute renal failure on chronic kidney disease stage III-T. Baseline creatinine of 1.3-1.5. Followed by [facility], Dr. Suzan Nailer.  Living donor renal transplant in 2009 (wife) Acute renal failure secondary to prerenal azotemia - immunosuppression: myfortic and tacrolimus. No prednisone - Continue IV fluids  2. Hypertension: blood pressure at goal. Holding home agents.   3. Diabetes mellitus type II with chronic kidney disease - continue glucose control  LOS: 0 Joss Friedel 11/22/20191:49 PM

## 2017-12-05 NOTE — Progress Notes (Signed)
Dilaudid was discontinued by provider earlier in the shift. Patient requested dilaudid and nurse explained to him that it was discontinued and it was done because he will need to be off of the IV pain medications in order to go home and that there were still oral medications that he could have. Nurse administered oral pain medications and upon reassessment patient stated " that doesn't do anything for the pain". Nurse contacted provider and provider ordered one time dos of dilaudid. Pain is in the patient's left knee from knee replacement.

## 2017-12-05 NOTE — Progress Notes (Signed)
[place] [facility] [place] NAME: Billy Hughes    MR#:  [id number]  DATE OF BIRTH:  02/20/1965  SUBJECTIVE:  patient came in with generalized weakness dry mouth and deconditioning. He was found to have elevated creatinine due to dehydration.. Still has some dry mouth and poor appetite. He reports poor appetite since his surgery three weeks ago.  REVIEW OF SYSTEMS:   Review of Systems  Constitutional: Positive for weight loss. Negative for chills and fever.  HENT: Negative for ear discharge, ear pain and nosebleeds.   Eyes: Negative for blurred vision, pain and discharge.  Respiratory: Negative for sputum production, shortness of breath, wheezing and stridor.   Cardiovascular: Negative for chest pain, palpitations, orthopnea and PND.  Gastrointestinal: Negative for abdominal pain, diarrhea, nausea and vomiting.  Genitourinary: Negative for frequency and urgency.  Musculoskeletal: Negative for back pain and joint pain.  Neurological: Positive for weakness. Negative for sensory change, speech change and focal weakness.  Psychiatric/Behavioral: Negative for depression and hallucinations. The patient is not nervous/anxious.    Tolerating Diet:some Tolerating PT: ambulatory  DRUG ALLERGIES:  No Known Allergies  VITALS:  Blood pressure 130/76, pulse 70, temperature 97.8 F (36.6 C), temperature source Oral, resp. rate 16, height 6' (1.829 m), weight 131.8 kg, SpO2 98 %.  PHYSICAL EXAMINATION:   Physical Exam  GENERAL:  52 y.o.-year-old patient lying in the bed with no acute distress.  EYES: Pupils equal, round, reactive to light and accommodation. No scleral icterus. Extraocular muscles intact.  HEENT: Head atraumatic, normocephalic. Oropharynx and nasopharynx clear.  NECK:  Supple, no jugular venous distention. No thyroid enlargement, no tenderness.  LUNGS: Normal breath sounds bilaterally, no wheezing, rales, rhonchi. No use of accessory  muscles of respiration.  CARDIOVASCULAR: S1, S2 normal. No murmurs, rubs, or gallops.  ABDOMEN: Soft, nontender, nondistended. Bowel sounds present. No organomegaly or mass.  EXTREMITIES: No cyanosis, clubbing or edema b/l.    NEUROLOGIC: Cranial nerves II through XII are intact. No focal Motor or sensory deficits b/l.   PSYCHIATRIC:  patient is alert and oriented x 3.  SKIN: No obvious rash, lesion, or ulcer.   LABORATORY PANEL:  CBC Recent Labs  Lab 12/05/17 0437  WBC 7.6  HGB 11.5*  HCT 35.6*  PLT 427*    Chemistries  Recent Labs  Lab 12/05/17 0437  NA 133*  K 4.7  CL 102  CO2 22  GLUCOSE 165*  BUN 32*  CREATININE 2.17*  CALCIUM 9.5   Cardiac Enzymes No results for input(s): TROPONINI in the last 168 hours. RADIOLOGY:  Dg Abd 1 View  Result Date: 12/04/2017 CLINICAL DATA:  Dehydration, decreased oral intake.  Unable to eat. EXAM: ABDOMEN - 1 VIEW COMPARISON:  CT scan 04/11/2014 FINDINGS: The bowel gas pattern is unremarkable. There is scattered air and stool throughout the colon and down into the rectum. There are also air-filled loops of small bowel in the mid and left abdomen but no distension. No free air. The soft tissue shadows are maintained. An IVC filter is noted. The bony structures are intact. The left lung base is grossly clear. IMPRESSION: Unremarkable abdominal radiographs. Electronically Signed   By: Marijo Sanes M.D.   On: 12/04/2017 18:37   US Renal  Result Date: 12/04/2017 CLINICAL DATA:  Transplant kidney.  RIGHT renal pelvic transplant. EXAM: RENAL / URINARY TRACT ULTRASOUND COMPLETE COMPARISON:  None. FINDINGS: Native kidneys difficult to visualize. Transplant kidney: 11.4 x 8.0 x 6.8 cm. No  hydronephrosis. Small anechoic cysts measuring 1 cm in the mid renal cortex. Bladder: Appears normal for degree of bladder distention. IMPRESSION: 1. No hydronephrosis or obstruction of the transplant RIGHT pelvic kidney which is normal volume. 2. Atrophic native  kidneys difficult to visualize. Electronically Signed   By: Suzy Bouchard M.D.   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  1. AKI in the setting of poor oral intake. Start IVF  Creat 2.43--2.17 -baseline creat 1.4-1.8 Renal ultrasound no acute abnormality -appreciate nephrology Dr. Assunta Gambles input. Hold nephrotoxoc medications including Aldactone for now.  2. Nausea: This may be from acute kidney injury  -KUB no signs of bowel obstruction  3. generalaized weakness likley from above issues: PT evaluation -advised patient to see if he wants dietary to see regarding recommendations for oral intake and he was not too keen on it.  4. Hx of kidney transplant: check levels as mentioned above Nephrology consult appreciated  5. Hyponatremia from dehydration: sodium 129--- IV fluids-- 133  6. HTN: Continue Norvasc and metoprolol and monitor BP  7. HLD: continue statin  8. Tobacco dependence: Patient is encouraged to quit smoking. Counseling was provided for 4 minutes.  9. H/o DVT on xarelto Pt is s/p IVC filter placement  Case discussed with Care Management/Social Worker. Management plans discussed with the patient, family and they are in agreement.  CODE STATUS: *full*  DVT Prophylaxis:xarelto  TOTAL TIME TAKING CARE OF THIS PATIENT: *30* minutes.  >50% time spent on counselling and coordination of care  POSSIBLE D/C IN 1-2 DAYS, DEPENDING ON CLINICAL CONDITION.  Note: This dictation was prepared with Dragon dictation along with smaller phrase technology. Any transcriptional errors that result from this process are unintentional.  Fritzi Mandes M.D on 12/05/2017 at 3:38 PM  Between 7am to 6pm - Pager - [phone]  After 6pm go to [email or web address] - password [email or web address] [facility]  Office  [phone]  CC: Primary care physician; Sofie Hartigan, MDPatient ID: Billy Hughes, male   DOB: [birth year], 52 y.o.   MRN: [id number]

## 2017-12-06 LAB — GLUCOSE, CAPILLARY: GLUCOSE-CAPILLARY: 120 mg/dL — AB (ref 70–99)

## 2017-12-06 LAB — BASIC METABOLIC PANEL
Anion gap: 5 (ref 5–15)
BUN: 22 mg/dL — AB (ref 6–20)
CHLORIDE: 105 mmol/L (ref 98–111)
CO2: 25 mmol/L (ref 22–32)
CREATININE: 1.45 mg/dL — AB (ref 0.61–1.24)
Calcium: 9.5 mg/dL (ref 8.9–10.3)
GFR calc Af Amer: >60 mL/min (ref >60)
GFR calc non Af Amer: 54 mL/min — ABNORMAL LOW (ref >60)
Glucose, Bld: 133 mg/dL — ABNORMAL HIGH (ref 70–99)
Potassium: 5.2 mmol/L — ABNORMAL HIGH (ref 3.5–5.1)
Sodium: 135 mmol/L (ref 135–145)

## 2017-12-06 LAB — HIV ANTIBODY (ROUTINE TESTING W REFLEX): HIV Screen 4th Generation wRfx: NONREACTIVE

## 2017-12-06 MED ORDER — ADULT MULTIVITAMIN W/MINERALS CH: 1.0000 | ORAL_TABLET | Freq: Every day | ORAL | 0 refills | Status: DC

## 2017-12-06 MED ORDER — HYDROMORPHONE HCL 1 MG/ML IJ SOLN
1.0000 mg | Freq: Once | INTRAMUSCULAR | Status: AC
  Administered 2017-12-06: 1 mg via INTRAVENOUS
  Filled 2017-12-06: qty 1

## 2017-12-06 NOTE — Progress Notes (Signed)
Discharge teaching given to patient, patient verbalized understanding and had no questions. Patient IV removed. Patient will be transported home by family. All patient belongings gathered prior to leaving.  

## 2017-12-06 NOTE — Discharge Summary (Signed)
[place] at [place] NAME: Billy Hughes    MR#:  [id number]  DATE OF BIRTH:  [birth year]  DATE OF ADMISSION:  12/04/2017 ADMITTING PHYSICIAN: Bettey Costa, MD  DATE OF DISCHARGE: 12/06/2017  PRIMARY CARE PHYSICIAN: Sofie Hartigan, MD    ADMISSION DIAGNOSIS:  Hydronephrosis [N13.30] Dehydration [E86.0] Nausea & vomiting [R11.2] AKI (acute kidney injury) ([employer]) [N17.9]  DISCHARGE DIAGNOSIS:  Acute on chronic renal failure due to dehydraiton  SECONDARY DIAGNOSIS:   Past Medical History:  Diagnosis Date  . Anemia   . Anxiety   . Cancer ([place])    skin  . Diabetes mellitus without complication ([place])   . Diverticulitis   . Gastric ulcer   . GERD (gastroesophageal reflux disease)   . Hypertension   . 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  1. AKI pn CKD-II in the setting of poor oral intake. recieved IVF  Creat 2.43--2.17--1.45 -baseline creat 1.4-1.8 Renal ultrasound no acute abnormality -appreciate nephrology Dr. Assunta Gambles input. Hold nephrotoxoc medications including Aldactone for now.  2. Nausea:This may be from acute kidney injury  -KUB no signs of bowel obstruction  3. generalized weakness likley from above issues: PT evaluation -advised patient to see if he wants dietary to see regarding recommendations for oral intake and he was not too keen on it.  4. Hx of kidney transplant: check levels as mentioned above Nephrology consult appreciated  5. Hyponatremia from dehydration: sodium 129--- IV fluids-- 133--135  6. HTN: Continue Norvasc and metoprolol and monitor BP  7. HLD: continue statin  8.Tobacco dependence: Patient is encouraged to quit smoking. Counseling was provided for 4 minutes.  9. H/o DVT on  xarelto Pt is s/p IVC filter placement  Feels back to baseline. D/c home. D/w dr Juleen China CONSULTS OBTAINED:    DRUG ALLERGIES:  No Known Allergies  DISCHARGE MEDICATIONS:   Allergies as of 12/06/2017   No Known Allergies     Medication List    STOP taking these medications   Oxycodone HCl 10 MG Tabs   spironolactone 25 MG tablet Commonly known as:  ALDACTONE   sucralfate 1 g tablet Commonly known as:  CARAFATE     TAKE these medications   albuterol 108 (90 Base) MCG/ACT inhaler Commonly known as:  PROVENTIL HFA;VENTOLIN HFA Inhale 1-2 puffs into the lungs every 6 (six) hours as needed for wheezing or shortness of breath.   amLODipine 10 MG tablet Commonly known as:  NORVASC Take 10 mg by mouth at bedtime.   aspirin EC 81 MG tablet Take 81 mg by mouth at bedtime.   atorvastatin 20 MG tablet Commonly known as:  LIPITOR Take 20 mg by mouth at bedtime.   HYDROcodone-acetaminophen 5-325 MG tablet Commonly known as:  NORCO/VICODIN 1-2 tabs po q 8 hours prn What changed:    how much to take  how to take this  when to take this  reasons to take this  additional instructions   Magnesium Oxide 250 MG Tabs Take 500 mg by mouth daily.   metoprolol succinate 25 MG 24 hr tablet Commonly known as:  TOPROL-XL Take 50 mg by mouth 2 (two) times daily.   multivitamin with minerals Tabs tablet Take 1 tablet by mouth daily. Start taking on:  12/07/2017  mycophenolate 180 MG EC tablet Commonly known as:  MYFORTIC Take 360 mg by mouth 2 (two) times daily.   nortriptyline 25 MG capsule Commonly known as:  PAMELOR Take 25 mg by mouth at bedtime.   pantoprazole 40 MG tablet Commonly known as:  PROTONIX Take 1 tablet (40 mg total) by mouth 2 (two) times daily.   PARoxetine 20 MG tablet Commonly known as:  PAXIL Take 20 mg by mouth at bedtime.   rivaroxaban 20 MG Tabs tablet Commonly known as:  XARELTO Take 1 tablet (20 mg total) by mouth daily with  supper. What changed:  when to take this   tacrolimus 1 MG capsule Commonly known as:  PROGRAF Take 1 mg by mouth 2 (two) times daily.   traMADol 50 MG tablet Commonly known as:  ULTRAM Take 1-2 tablets (50-100 mg total) by mouth every 4 (four) hours as needed for moderate pain.       If you experience worsening of your admission symptoms, develop shortness of breath, life threatening emergency, suicidal or homicidal thoughts you must seek medical attention immediately by calling 911 or calling your MD immediately  if symptoms less severe.  You Must read complete instructions/literature along with all the possible adverse reactions/side effects for all the Medicines you take and that have been prescribed to you. Take any new Medicines after you have completely understood and accept all the possible adverse reactions/side effects.   Please note  You were cared for by a hospitalist during your hospital stay. If you have any questions about your discharge medications or the care you received while you were in the hospital after you are discharged, you can call the unit and asked to speak with the hospitalist on call if the hospitalist that took care of you is not available. Once you are discharged, your primary care physician will handle any further medical issues. Please note that NO REFILLS for any discharge medications will be authorized once you are discharged, as it is imperative that you return to your primary care physician (or establish a relationship with a primary care physician if you do not have one) for your aftercare needs so that they can reassess your need for medications and monitor your lab values. Today   SUBJECTIVE   Doing well  VITAL SIGNS:  Blood pressure (!) 157/72, pulse 75, temperature 98.1 F (36.7 C), temperature source Oral, resp. rate 16, height 6' (1.829 m), weight 131.8 kg, SpO2 98 %.  I/O:    Intake/Output Summary (Last 24 hours) at 12/06/2017 1056 Last  data filed at 12/06/2017 0700 Gross per 24 hour  Intake 240 ml  Output 2725 ml  Net -2485 ml    PHYSICAL EXAMINATION:  GENERAL:  52 y.o.-year-old patient lying in the bed with no acute distress.  EYES: Pupils equal, round, reactive to light and accommodation. No scleral icterus. Extraocular muscles intact.  HEENT: Head atraumatic, normocephalic. Oropharynx and nasopharynx clear.  NECK:  Supple, no jugular venous distention. No thyroid enlargement, no tenderness.  LUNGS: Normal breath sounds bilaterally, no wheezing, rales,rhonchi or crepitation. No use of accessory muscles of respiration.  CARDIOVASCULAR: S1, S2 normal. No murmurs, rubs, or gallops.  ABDOMEN: Soft, non-tender, non-distended. Bowel sounds present. No organomegaly or mass.  EXTREMITIES: No pedal edema, cyanosis, or clubbing. Left knee surgical incision looks ok NEUROLOGIC: Cranial nerves II through XII are intact. Muscle strength 5/5 in all extremities. Sensation intact. Gait not checked.  PSYCHIATRIC: The patient is alert and oriented x 3.  SKIN: No obvious rash, lesion, or ulcer.   DATA REVIEW:   CBC  Recent Labs  Lab 12/05/17 0437  WBC 7.6  HGB 11.5*  HCT 35.6*  PLT 427*    Chemistries  Recent Labs  Lab 12/06/17 0659  NA 135  K 5.2*  CL 105  CO2 25  GLUCOSE 133*  BUN 22*  CREATININE 1.45*  CALCIUM 9.5    Microbiology Results   No results found for this or any previous visit (from the past 240 hour(s)).  RADIOLOGY:  Dg Abd 1 View  Result Date: 12/04/2017 CLINICAL DATA:  Dehydration, decreased oral intake.  Unable to eat. EXAM: ABDOMEN - 1 VIEW COMPARISON:  CT scan 04/11/2014 FINDINGS: The bowel gas pattern is unremarkable. There is scattered air and stool throughout the colon and down into the rectum. There are also air-filled loops of small bowel in the mid and left abdomen but no distension. No free air. The soft tissue shadows are maintained. An IVC filter is noted. The bony structures are  intact. The left lung base is grossly clear. IMPRESSION: Unremarkable abdominal radiographs. Electronically Signed   By: Marijo Sanes M.D.   On: 12/04/2017 18:37   US Renal  Result Date: 12/04/2017 CLINICAL DATA:  Transplant kidney.  RIGHT renal pelvic transplant. EXAM: RENAL / URINARY TRACT ULTRASOUND COMPLETE COMPARISON:  None. FINDINGS: Native kidneys difficult to visualize. Transplant kidney: 11.4 x 8.0 x 6.8 cm. No hydronephrosis. Small anechoic cysts measuring 1 cm in the mid renal cortex. Bladder: Appears normal for degree of bladder distention. IMPRESSION: 1. No hydronephrosis or obstruction of the transplant RIGHT pelvic kidney which is normal volume. 2. Atrophic native kidneys difficult to visualize. Electronically Signed   By: Suzy Bouchard M.D.   On: 12/04/2017 19:29     Management plans discussed with the patient, family and they are in agreement.  CODE STATUS:     Code Status Orders  (From admission, onward)         Start     Ordered   12/04/17 1750  Full code  Continuous     12/04/17 1749        Code Status History    Date Active Date Inactive Code Status Order ID Comments User Context   11/12/2017 1647 11/14/2017 1517 Full Code [id number]  Dereck Leep, MD Inpatient      TOTAL TIME TAKING CARE OF THIS PATIENT: *40* minutes.    Fritzi Mandes M.D on 12/06/2017 at 10:56 AM  Between 7am to 6pm - Pager - [phone] After 6pm go to [email or web address] - password [email or web address] [facility]  Office  [phone]  CC: Primary care physician; Sofie Hartigan, MD

## 2017-12-06 NOTE — Discharge Instructions (Signed)
F/u with your nephrologist in 1 week

## 2017-12-06 NOTE — Progress Notes (Signed)
[facility]  ROUNDING NOTE   Subjective:   Patient complains of dry mouth. Nausea and appetite have improved.   UOP 2800  Creatinine 1.45 (2.17)  Objective:  Vital signs in last 24 hours:  Temp:  [97.5 F (36.4 C)-98.1 F (36.7 C)] 98.1 F (36.7 C) (11/23 0537) Pulse Rate:  [71-75] 75 (11/23 1021) Resp:  [16] 16 (11/23 0537) BP: (118-157)/(65-72) 157/72 (11/23 1021) SpO2:  [93 %-98 %] 98 % (11/23 0537)  Weight change:  Filed Weights   12/04/17 1340  Weight: 131.8 kg    Intake/Output: I/O last 3 completed shifts: In: 480 [P.O.:480] Out: 3875 [Urine:3875]   Intake/Output this shift:  No intake/output data recorded.  Physical Exam: General: NAD,   Head: Normocephalic, atraumatic. Moist oral mucosal membranes  Eyes: Anicteric, PERRL  Neck: Supple, trachea midline  Lungs:  Clear to auscultation  Heart: Regular rate and rhythm  Abdomen:  Soft, nontender, +allograft RLQ kidney  Extremities: no peripheral edema.  Neurologic: Nonfocal, moving all four extremities  Skin: No lesions  Access: Left AVF    Basic Metabolic Panel: Recent Labs  Lab 12/04/17 1341 12/05/17 0437 12/06/17 0659  NA 129* 133* 135  K 4.5 4.7 5.2*  CL 94* 102 105  CO2 22 22 25   GLUCOSE 208* 165* 133*  BUN 34* 32* 22*  CREATININE 2.43* 2.17* 1.45*  CALCIUM 10.2 9.5 9.5    Liver Function Tests: No results for input(s): AST, ALT, ALKPHOS, BILITOT, PROT, ALBUMIN in the last 168 hours. No results for input(s): LIPASE, AMYLASE in the last 168 hours. No results for input(s): AMMONIA in the last 168 hours.  CBC: Recent Labs  Lab 12/04/17 1341 12/05/17 0437  WBC 11.5* 7.6  HGB 13.7 11.5*  HCT 43.1 35.6*  MCV 87.1 86.4  PLT 566* 427*    Cardiac Enzymes: No results for input(s): CKTOTAL, CKMB, CKMBINDEX, TROPONINI in the last 168 hours.  BNP: Invalid input(s): POCBNP  CBG: Recent Labs  Lab 12/05/17 0746 12/05/17 1157 12/05/17 1705 12/05/17 2148 12/06/17 0811   GLUCAP 107* 164* 146* 150* 120*    Microbiology: Results for orders placed or performed during the hospital encounter of 10/29/17  Urine culture     Status: None   Collection Time: 10/29/17  7:45 AM  Result Value Ref Range Status   Specimen Description   Final    URINE, RANDOM Performed at [facility], [address], [place], [place] [address]    Special Requests   Final    Normal Performed at [facility], [address] [place], [place] [address]    Culture   Final    NO GROWTH Performed at [facility], [place] [address] [place], [place] [address]    Report Status 10/30/2017 FINAL  Final  Surgical pcr screen     Status: None   Collection Time: 10/29/17  9:05 AM  Result Value Ref Range Status   MRSA, PCR NEGATIVE NEGATIVE Final   Staphylococcus aureus NEGATIVE NEGATIVE Final    Comment: (NOTE) The Xpert SA Assay (FDA approved for NASAL specimens in patients 46 years of age and older), is one component of a comprehensive surveillance program. It is not intended to diagnose infection nor to guide or monitor treatment. Performed at [facility], [address] [place], [place] [address]     Coagulation Studies: No results for input(s): LABPROT, INR in the last 72 hours.  Urinalysis: Recent Labs    12/04/17 [place]  YELLOW*  LABSPEC 1.010  PHURINE 5.0  GLUCOSEU NEGATIVE  HGBUR NEGATIVE  BILIRUBINUR NEGATIVE  KETONESUR NEGATIVE  PROTEINUR NEGATIVE  NITRITE NEGATIVE  LEUKOCYTESUR NEGATIVE      Imaging: Dg Abd 1 View  Result Date: 12/04/2017 CLINICAL DATA:  Dehydration, decreased oral intake.  Unable to eat. EXAM: ABDOMEN - 1 VIEW COMPARISON:  CT scan 04/11/2014 FINDINGS: The bowel gas pattern is unremarkable. There is scattered air and stool throughout the colon and down into the rectum. There are also air-filled loops of small bowel in the mid and left abdomen but no distension. No free air. The soft tissue  shadows are maintained. An IVC filter is noted. The bony structures are intact. The left lung base is grossly clear. IMPRESSION: Unremarkable abdominal radiographs. Electronically Signed   By: Marijo Sanes M.D.   On: 12/04/2017 18:37   US Renal  Result Date: 12/04/2017 CLINICAL DATA:  Transplant kidney.  RIGHT renal pelvic transplant. EXAM: RENAL / URINARY TRACT ULTRASOUND COMPLETE COMPARISON:  None. FINDINGS: Native kidneys difficult to visualize. Transplant kidney: 11.4 x 8.0 x 6.8 cm. No hydronephrosis. Small anechoic cysts measuring 1 cm in the mid renal cortex. Bladder: Appears normal for degree of bladder distention. IMPRESSION: 1. No hydronephrosis or obstruction of the transplant RIGHT pelvic kidney which is normal volume. 2. Atrophic native kidneys difficult to visualize. Electronically Signed   By: Suzy Bouchard M.D.   On: 12/04/2017 19:29     Medications:    . amLODipine  10 mg Oral QHS  . aspirin EC  81 mg Oral QHS  . atorvastatin  20 mg Oral QHS  . feeding supplement (ENSURE ENLIVE)  237 mL Oral BID BM  . insulin aspart  0-9 Units Subcutaneous TID WC  . magnesium oxide  400 mg Oral Daily  . metoprolol succinate  50 mg Oral BID  . multivitamin with minerals  1 tablet Oral Daily  . mycophenolate  360 mg Oral BID  . nortriptyline  25 mg Oral QHS  . pantoprazole  40 mg Oral BID  . PARoxetine  20 mg Oral QHS  . rivaroxaban  20 mg Oral QHS  . tacrolimus  1 mg Oral BID   acetaminophen **OR** acetaminophen, HYDROcodone-acetaminophen, menthol-cetylpyridinium, ondansetron **OR** ondansetron (ZOFRAN) IV, phenol, polyethylene glycol  Assessment/ Plan:  Mr. Billy Hughes is a 52 y.o. white male Mr. Billy Hughes is a 52 y.o. white male with living donor renal transplant in 2009, who was admitted to [facility] on 12/04/2017 for acute renal failure  1. Acute renal failure on chronic kidney disease stage III-T. Baseline creatinine of 1.3-1.5. Followed by [facility], Dr.  Suzan Nailer.  Living donor renal transplant in 2009 (wife) Acute renal failure secondary to prerenal azotemia - immunosuppression: myfortic and tacrolimus. No prednisone - encourage PO intake.   2. Hypertension: blood pressure at goal. Holding home agents.   3. Diabetes mellitus type II with chronic kidney disease - continue glucose control    LOS: 0 Deven Audi 11/23/201912:53 PM

## 2017-12-07 LAB — TACROLIMUS LEVEL: Tacrolimus (FK506) - LabCorp: 12.6 ng/mL (ref 2.0–20.0)

## 2017-12-09 LAB — MYCOPHENOLIC ACID (CELLCEPT)
MPA GLUCURONIDE: 81 ug/mL (ref 15–125)
MPA: 1.7 ug/mL (ref 1.0–3.5)

## 2017-12-14 MED ORDER — METOPROLOL TARTRATE 25 MG PO TABS
25.00 | ORAL_TABLET | ORAL | Status: DC
Start: 2017-12-14 — End: 2017-12-14

## 2017-12-14 MED ORDER — NALOXONE HCL 4 MG/10ML IJ SOLN
0.20 | INTRAMUSCULAR | Status: DC
Start: ? — End: 2017-12-14

## 2017-12-14 MED ORDER — ATORVASTATIN CALCIUM 20 MG PO TABS
20.00 | ORAL_TABLET | ORAL | Status: DC
Start: 2017-12-15 — End: 2017-12-14

## 2017-12-14 MED ORDER — DEXTROSE 10 % IV SOLN
12.50 | INTRAVENOUS | Status: DC
Start: ? — End: 2017-12-14

## 2017-12-14 MED ORDER — POLYETHYLENE GLYCOL 3350 17 G PO PACK
17.00 | PACK | ORAL | Status: DC
Start: 2017-12-15 — End: 2017-12-14

## 2017-12-14 MED ORDER — GABAPENTIN 100 MG PO CAPS
100.00 | ORAL_CAPSULE | ORAL | Status: DC
Start: 2017-12-14 — End: 2017-12-14

## 2017-12-14 MED ORDER — TACROLIMUS 1 MG PO CAPS
1.00 | ORAL_CAPSULE | ORAL | Status: DC
Start: 2017-12-14 — End: 2017-12-14

## 2017-12-14 MED ORDER — NORTRIPTYLINE HCL 10 MG PO CAPS
20.00 | ORAL_CAPSULE | ORAL | Status: DC
Start: 2017-12-14 — End: 2017-12-14

## 2017-12-14 MED ORDER — CEFDINIR 300 MG PO CAPS
300.00 | ORAL_CAPSULE | ORAL | Status: DC
Start: 2017-12-14 — End: 2017-12-14

## 2017-12-14 MED ORDER — PNEUMOCOCCAL VAC POLYVALENT 25 MCG/0.5ML IJ INJ
0.50 | INJECTION | INTRAMUSCULAR | Status: DC
Start: ? — End: 2017-12-14

## 2017-12-14 MED ORDER — MYCOPHENOLATE SODIUM 360 MG PO TBEC
360.00 | DELAYED_RELEASE_TABLET | ORAL | Status: DC
Start: 2017-12-14 — End: 2017-12-14

## 2017-12-14 MED ORDER — PAROXETINE HCL 20 MG PO TABS
20.00 | ORAL_TABLET | ORAL | Status: DC
Start: 2017-12-15 — End: 2017-12-14

## 2017-12-14 MED ORDER — NICOTINE POLACRILEX 2 MG MT LOZG
2.00 | LOZENGE | OROMUCOSAL | Status: DC
Start: ? — End: 2017-12-14

## 2017-12-14 MED ORDER — INSULIN LISPRO 100 UNIT/ML ~~LOC~~ SOLN
0.00 | SUBCUTANEOUS | Status: DC
Start: 2017-12-14 — End: 2017-12-14

## 2017-12-14 MED ORDER — GENERIC EXTERNAL MEDICATION
2.00 | Status: DC
Start: ? — End: 2017-12-14

## 2017-12-14 MED ORDER — LIDOCAINE 5 % EX PTCH
1.00 | MEDICATED_PATCH | CUTANEOUS | Status: DC
Start: 2017-12-15 — End: 2017-12-14

## 2017-12-14 MED ORDER — AZITHROMYCIN 250 MG PO TABS
250.00 | ORAL_TABLET | ORAL | Status: DC
Start: 2017-12-15 — End: 2017-12-14

## 2017-12-14 MED ORDER — FAMOTIDINE 40 MG PO TABS
40.00 | ORAL_TABLET | ORAL | Status: DC
Start: 2017-12-15 — End: 2017-12-14

## 2017-12-14 MED ORDER — ACETAMINOPHEN 500 MG PO TABS
1000.00 | ORAL_TABLET | ORAL | Status: DC
Start: 2017-12-14 — End: 2017-12-14

## 2017-12-14 MED ORDER — RIVAROXABAN 20 MG PO TABS
20.00 | ORAL_TABLET | ORAL | Status: DC
Start: 2017-12-14 — End: 2017-12-14

## 2017-12-14 MED ORDER — DICLOFENAC SODIUM 1 % TD GEL
2.00 | TRANSDERMAL | Status: DC
Start: 2017-12-14 — End: 2017-12-14

## 2017-12-14 MED ORDER — BIOTENE DRY MOUTH MOISTURIZING MT SOLN
1.00 | OROMUCOSAL | Status: DC
Start: ? — End: 2017-12-14

## 2017-12-14 MED ORDER — NICOTINE POLACRILEX 4 MG MT GUM
4.00 | CHEWING_GUM | OROMUCOSAL | Status: DC
Start: ? — End: 2017-12-14

## 2017-12-14 MED ORDER — ASPIRIN 81 MG PO CHEW
81.00 | CHEWABLE_TABLET | ORAL | Status: DC
Start: 2017-12-15 — End: 2017-12-14

## 2017-12-19 ENCOUNTER — Ambulatory Visit (INDEPENDENT_AMBULATORY_CARE_PROVIDER_SITE_OTHER): Payer: BLUE CROSS/BLUE SHIELD | Admitting: Vascular Surgery

## 2017-12-22 ENCOUNTER — Encounter: Payer: Self-pay | Admitting: Emergency Medicine

## 2017-12-22 ENCOUNTER — Observation Stay
Admission: EM | Admit: 2017-12-22 | Discharge: 2017-12-23 | Disposition: A | Discharge status: Home / Self Care | Payer: BLUE CROSS/BLUE SHIELD | Attending: Internal Medicine | Admitting: Internal Medicine

## 2017-12-22 ENCOUNTER — Other Ambulatory Visit: Payer: Self-pay

## 2017-12-22 ENCOUNTER — Emergency Department: Payer: BLUE CROSS/BLUE SHIELD

## 2017-12-22 DIAGNOSIS — Z7982 Long term (current) use of aspirin: Secondary | ICD-10-CM | POA: Insufficient documentation

## 2017-12-22 DIAGNOSIS — E860 Dehydration: Secondary | ICD-10-CM | POA: Insufficient documentation

## 2017-12-22 DIAGNOSIS — E785 Hyperlipidemia, unspecified: Secondary | ICD-10-CM | POA: Insufficient documentation

## 2017-12-22 DIAGNOSIS — N289 Disorder of kidney and ureter, unspecified: Secondary | ICD-10-CM | POA: Diagnosis present

## 2017-12-22 DIAGNOSIS — Z66 Do not resuscitate: Secondary | ICD-10-CM | POA: Diagnosis not present

## 2017-12-22 DIAGNOSIS — F419 Anxiety disorder, unspecified: Secondary | ICD-10-CM | POA: Insufficient documentation

## 2017-12-22 DIAGNOSIS — K219 Gastro-esophageal reflux disease without esophagitis: Secondary | ICD-10-CM | POA: Diagnosis not present

## 2017-12-22 DIAGNOSIS — E1140 Type 2 diabetes mellitus with diabetic neuropathy, unspecified: Secondary | ICD-10-CM | POA: Insufficient documentation

## 2017-12-22 DIAGNOSIS — N189 Chronic kidney disease, unspecified: Secondary | ICD-10-CM | POA: Diagnosis not present

## 2017-12-22 DIAGNOSIS — Y830 Surgical operation with transplant of whole organ as the cause of abnormal reaction of the patient, or of later complication, without mention of misadventure at the time of the procedure: Secondary | ICD-10-CM | POA: Insufficient documentation

## 2017-12-22 DIAGNOSIS — E1122 Type 2 diabetes mellitus with diabetic chronic kidney disease: Secondary | ICD-10-CM | POA: Insufficient documentation

## 2017-12-22 DIAGNOSIS — I129 Hypertensive chronic kidney disease with stage 1 through stage 4 chronic kidney disease, or unspecified chronic kidney disease: Secondary | ICD-10-CM | POA: Insufficient documentation

## 2017-12-22 DIAGNOSIS — Z794 Long term (current) use of insulin: Secondary | ICD-10-CM | POA: Insufficient documentation

## 2017-12-22 DIAGNOSIS — Z86718 Personal history of other venous thrombosis and embolism: Secondary | ICD-10-CM | POA: Insufficient documentation

## 2017-12-22 DIAGNOSIS — Z940 Kidney transplant status: Secondary | ICD-10-CM

## 2017-12-22 DIAGNOSIS — N179 Acute kidney failure, unspecified: Principal | ICD-10-CM | POA: Insufficient documentation

## 2017-12-22 DIAGNOSIS — T8619 Other complication of kidney transplant: Secondary | ICD-10-CM | POA: Diagnosis not present

## 2017-12-22 DIAGNOSIS — R7989 Other specified abnormal findings of blood chemistry: Secondary | ICD-10-CM

## 2017-12-22 DIAGNOSIS — Z79899 Other long term (current) drug therapy: Secondary | ICD-10-CM | POA: Diagnosis not present

## 2017-12-22 DIAGNOSIS — Z7901 Long term (current) use of anticoagulants: Secondary | ICD-10-CM | POA: Insufficient documentation

## 2017-12-22 LAB — URINALYSIS, COMPLETE (UACMP) WITH MICROSCOPIC
BACTERIA UA: NONE SEEN
BILIRUBIN URINE: NEGATIVE
GLUCOSE, UA: NEGATIVE mg/dL
KETONES UR: 5 mg/dL — AB
LEUKOCYTES UA: NEGATIVE
NITRITE: NEGATIVE
PH: 5 (ref 5.0–8.0)
PROTEIN: NEGATIVE mg/dL
Specific Gravity, Urine: 1.01 (ref 1.005–1.030)

## 2017-12-22 LAB — CBC
HEMATOCRIT: 43.2 % (ref 39.0–52.0)
HEMOGLOBIN: 14.1 g/dL (ref 13.0–17.0)
MCH: 28.3 pg (ref 26.0–34.0)
MCHC: 32.6 g/dL (ref 30.0–36.0)
MCV: 86.7 fL (ref 80.0–100.0)
Platelets: 252 10*3/uL (ref 150–400)
RBC: 4.98 MIL/uL (ref 4.22–5.81)
RDW: 15.5 % (ref 11.5–15.5)
WBC: 11.4 10*3/uL — AB (ref 4.0–10.5)
nRBC: 0 % (ref 0.0–0.2)

## 2017-12-22 LAB — GLUCOSE, CAPILLARY: Glucose-Capillary: 99 mg/dL (ref 70–99)

## 2017-12-22 LAB — BASIC METABOLIC PANEL
ANION GAP: 12 (ref 5–15)
BUN: 23 mg/dL — AB (ref 6–20)
CHLORIDE: 100 mmol/L (ref 98–111)
CO2: 21 mmol/L — ABNORMAL LOW (ref 22–32)
Calcium: 10.3 mg/dL (ref 8.9–10.3)
Creatinine, Ser: 2.38 mg/dL — ABNORMAL HIGH (ref 0.61–1.24)
GFR calc Af Amer: 35 mL/min — ABNORMAL LOW (ref >60)
GFR calc non Af Amer: 30 mL/min — ABNORMAL LOW (ref >60)
Glucose, Bld: 121 mg/dL — ABNORMAL HIGH (ref 70–99)
POTASSIUM: 4.2 mmol/L (ref 3.5–5.1)
SODIUM: 133 mmol/L — AB (ref 135–145)

## 2017-12-22 MED ORDER — SODIUM CHLORIDE 0.9 % IV BOLUS
1000.0000 mL | Freq: Once | INTRAVENOUS | Status: AC
  Administered 2017-12-22: 1000 mL via INTRAVENOUS

## 2017-12-22 MED ORDER — AMLODIPINE BESYLATE 10 MG PO TABS
10.0000 mg | ORAL_TABLET | Freq: Every day | ORAL | Status: DC
  Administered 2017-12-22: 10 mg via ORAL
  Filled 2017-12-22: qty 1

## 2017-12-22 MED ORDER — ADULT MULTIVITAMIN W/MINERALS CH
1.0000 | ORAL_TABLET | Freq: Every day | ORAL | Status: DC
  Administered 2017-12-23: 1 via ORAL
  Filled 2017-12-22: qty 1

## 2017-12-22 MED ORDER — METOCLOPRAMIDE HCL 5 MG/ML IJ SOLN: 10.0000 mg | Freq: Once | INTRAMUSCULAR | Status: DC

## 2017-12-22 MED ORDER — MEGESTROL ACETATE 20 MG PO TABS
40.0000 mg | ORAL_TABLET | Freq: Every day | ORAL | Status: DC
  Administered 2017-12-22 – 2017-12-23 (×2): 40 mg via ORAL
  Filled 2017-12-22 (×2): qty 2

## 2017-12-22 MED ORDER — ACETAMINOPHEN 325 MG PO TABS: 650.0000 mg | ORAL_TABLET | Freq: Four times a day (QID) | ORAL | Status: DC | PRN

## 2017-12-22 MED ORDER — TACROLIMUS 1 MG PO CAPS
1.0000 mg | ORAL_CAPSULE | Freq: Two times a day (BID) | ORAL | Status: DC
  Administered 2017-12-22 – 2017-12-23 (×2): 1 mg via ORAL
  Filled 2017-12-22 (×3): qty 1

## 2017-12-22 MED ORDER — HYDROCODONE-ACETAMINOPHEN 5-325 MG PO TABS
1.0000 | ORAL_TABLET | ORAL | Status: DC | PRN
  Administered 2017-12-22: 23:00:00 1 via ORAL
  Filled 2017-12-22: qty 1

## 2017-12-22 MED ORDER — SODIUM CHLORIDE 0.9 % IV SOLN
INTRAVENOUS | Status: DC
  Administered 2017-12-22 – 2017-12-23 (×2): via INTRAVENOUS

## 2017-12-22 MED ORDER — BISACODYL 5 MG PO TBEC: 5.0000 mg | DELAYED_RELEASE_TABLET | Freq: Every day | ORAL | Status: DC | PRN

## 2017-12-22 MED ORDER — ACETAMINOPHEN 650 MG RE SUPP: 650.0000 mg | Freq: Four times a day (QID) | RECTAL | Status: DC | PRN

## 2017-12-22 MED ORDER — ONDANSETRON HCL 4 MG PO TABS: 4.0000 mg | ORAL_TABLET | Freq: Four times a day (QID) | ORAL | Status: DC | PRN

## 2017-12-22 MED ORDER — MYCOPHENOLATE SODIUM 180 MG PO TBEC
360.0000 mg | DELAYED_RELEASE_TABLET | Freq: Two times a day (BID) | ORAL | Status: DC
  Administered 2017-12-22 – 2017-12-23 (×2): 360 mg via ORAL
  Filled 2017-12-22 (×3): qty 2

## 2017-12-22 MED ORDER — PANTOPRAZOLE SODIUM 40 MG PO TBEC
40.0000 mg | DELAYED_RELEASE_TABLET | Freq: Two times a day (BID) | ORAL | Status: DC
  Administered 2017-12-22 – 2017-12-23 (×2): 40 mg via ORAL
  Filled 2017-12-22 (×2): qty 1

## 2017-12-22 MED ORDER — METOCLOPRAMIDE HCL 5 MG/ML IJ SOLN
10.0000 mg | Freq: Once | INTRAMUSCULAR | Status: AC
  Administered 2017-12-22: 10 mg via INTRAVENOUS

## 2017-12-22 MED ORDER — NORTRIPTYLINE HCL 25 MG PO CAPS
25.0000 mg | ORAL_CAPSULE | Freq: Every day | ORAL | Status: DC
  Administered 2017-12-22: 25 mg via ORAL
  Filled 2017-12-22 (×2): qty 1

## 2017-12-22 MED ORDER — METOCLOPRAMIDE HCL 5 MG/ML IJ SOLN
INTRAMUSCULAR | Status: AC
  Administered 2017-12-22: 10 mg via INTRAVENOUS
  Filled 2017-12-22: qty 2

## 2017-12-22 MED ORDER — PAROXETINE HCL 20 MG PO TABS
20.0000 mg | ORAL_TABLET | Freq: Every day | ORAL | Status: DC
  Administered 2017-12-22: 20 mg via ORAL
  Filled 2017-12-22 (×2): qty 1

## 2017-12-22 MED ORDER — METOPROLOL SUCCINATE ER 50 MG PO TB24
50.0000 mg | ORAL_TABLET | Freq: Two times a day (BID) | ORAL | Status: DC
  Administered 2017-12-22 – 2017-12-23 (×2): 50 mg via ORAL
  Filled 2017-12-22 (×2): qty 1

## 2017-12-22 MED ORDER — ASPIRIN EC 81 MG PO TBEC
81.0000 mg | DELAYED_RELEASE_TABLET | Freq: Every day | ORAL | Status: DC
  Administered 2017-12-22: 81 mg via ORAL
  Filled 2017-12-22: qty 1

## 2017-12-22 MED ORDER — MAGNESIUM OXIDE 400 (241.3 MG) MG PO TABS
400.0000 mg | ORAL_TABLET | Freq: Every day | ORAL | Status: DC
  Administered 2017-12-23: 10:00:00 400 mg via ORAL
  Filled 2017-12-22: qty 1

## 2017-12-22 MED ORDER — ATORVASTATIN CALCIUM 20 MG PO TABS: 20.0000 mg | ORAL_TABLET | Freq: Every day | ORAL | Status: DC

## 2017-12-22 MED ORDER — ALBUTEROL SULFATE (2.5 MG/3ML) 0.083% IN NEBU: 2.5000 mg | INHALATION_SOLUTION | Freq: Four times a day (QID) | RESPIRATORY_TRACT | Status: DC | PRN

## 2017-12-22 MED ORDER — ONDANSETRON HCL 4 MG/2ML IJ SOLN: 4.0000 mg | Freq: Four times a day (QID) | INTRAMUSCULAR | Status: DC | PRN

## 2017-12-22 MED ORDER — RIVAROXABAN 20 MG PO TABS: 20.0000 mg | ORAL_TABLET | Freq: Every day | ORAL | Status: DC

## 2017-12-22 NOTE — ED Notes (Signed)
ED TO INPATIENT HANDOFF REPORT  Name/Age/Gender Billy Hughes 52 y.o. male  Code Status    Code Status Orders  (From admission, onward)         Start     Ordered   12/22/17 1802  Full code  Continuous     12/22/17 1803        Code Status History    Date Active Date Inactive Code Status Order ID Comments User Context   12/04/2017 1749 12/06/2017 1550 Full Code [id number]  Bettey Costa, MD ED   11/12/2017 1647 11/14/2017 1517 Full Code [id number]  Hooten, Laurice Record, MD Inpatient      Home/SNF/Other Home  Chief Complaint knee replacement oct, no appetite, lost 40 lbs 3 wks   Level of Care/Admitting Diagnosis ED Disposition    ED Disposition Condition [place]: [place] [[id number]]  Level of Care: Med-Surg [16]  Diagnosis: Acute renal failure (ARF) [facility]) [[id number]]  Admitting Physician: Epifanio Lesches [[id number]]  Attending Physician: Epifanio Lesches [[id number]]  Estimated length of stay: past midnight tomorrow  Certification:: I certify this patient will need inpatient services for at least 2 midnights  PT Class (Do Not Modify): Inpatient [101]  PT Acc Code (Do Not Modify): Private [1]       Medical History Past Medical History:  Diagnosis Date  . Anemia   . Anxiety   . Cancer ([place])    skin  . Diabetes mellitus without complication ([place])   . Diverticulitis   . Gastric ulcer   . GERD (gastroesophageal reflux disease)   . Hypertension   . Renal disorder     Allergies No Known Allergies  IV Location/Drains/Wounds Patient Lines/Drains/Airways Status   Active Line/Drains/Airways    Name:   Placement date:   Placement time:   Site:   Days:   Peripheral IV 12/22/17 Right Antecubital   12/22/17    1103    Antecubital   less than 1   Incision (Closed) 11/12/17 Knee Left   11/12/17    1342     40          Labs/Imaging Results for orders placed or performed during the hospital encounter of 12/22/17 (from  the past 48 hour(s))  Basic metabolic panel     Status: Abnormal   Collection Time: 12/22/17 11:04 AM  Result Value Ref Range   Sodium 133 (L) 135 - 145 mmol/L   Potassium 4.2 3.5 - 5.1 mmol/L   Chloride 100 98 - 111 mmol/L   CO2 21 (L) 22 - 32 mmol/L   Glucose, Bld 121 (H) 70 - 99 mg/dL   BUN 23 (H) 6 - 20 mg/dL   Creatinine, Ser 2.38 (H) 0.61 - 1.24 mg/dL   Calcium 10.3 8.9 - 10.3 mg/dL   GFR calc non Af Amer 30 (L) >60 mL/min   GFR calc Af Amer 35 (L) >60 mL/min   Anion gap 12 5 - 15    Comment: Performed at [facility], [address] [place], [place] [address]  CBC     Status: Abnormal   Collection Time: 12/22/17 11:04 AM  Result Value Ref Range   WBC 11.4 (H) 4.0 - 10.5 K/uL   RBC 4.98 4.22 - 5.81 MIL/uL   Hemoglobin 14.1 13.0 - 17.0 g/dL   HCT 43.2 39.0 - 52.0 %   MCV 86.7 80.0 - 100.0 fL   MCH 28.3 26.0 - 34.0 pg   MCHC 32.6 30.0 -  36.0 g/dL   RDW 15.5 11.5 - 15.5 %   Platelets 252 150 - 400 K/uL   nRBC 0.0 0.0 - 0.2 %    Comment: Performed at [facility] [address] [place], [place] [address]  Urinalysis, Complete w Microscopic     Status: Abnormal   Collection Time: 12/22/17  4:29 PM  Result Value Ref Range   Color, Urine YELLOW (A) YELLOW   APPearance CLEAR (A) CLEAR   Specific Gravity, Urine 1.010 1.005 - 1.030   pH 5.0 5.0 - 8.0   Glucose, UA NEGATIVE NEGATIVE mg/dL   Hgb urine dipstick SMALL (A) NEGATIVE   Bilirubin Urine NEGATIVE NEGATIVE   Ketones, ur 5 (A) NEGATIVE mg/dL   Protein, ur NEGATIVE NEGATIVE mg/dL   Nitrite NEGATIVE NEGATIVE   Leukocytes, UA NEGATIVE NEGATIVE   RBC / HPF 6-10 0 - 5 RBC/hpf   WBC, UA 0-5 0 - 5 WBC/hpf   Bacteria, UA NONE SEEN NONE SEEN   Squamous Epithelial / LPF 0-5 0 - 5   Mucus PRESENT    Hyaline Casts, UA PRESENT     Comment: Performed at [facility], [address], [place], [place] [address]   US Renal Transplant W/doppler  Result Date: 12/22/2017 CLINICAL DATA:  52 y/o M;  renal transplant August of 2009. Elevated creatinine. EXAM: ULTRASOUND OF RENAL TRANSPLANT WITH RENAL DOPPLER ULTRASOUND TECHNIQUE: Ultrasound examination of the renal transplant was performed with gray-scale, color and duplex doppler evaluation. COMPARISON:  12/04/2017 renal ultrasound FINDINGS: Transplant kidney location: Right lower quadrant Transplant Kidney: Renal measurements: 12.9 x 6.4 x 6.6 cm = volume: 286.87mL. Normal in size and parenchymal echogenicity. Upper pole simple cyst measuring 9 mm. No peri-transplant fluid collection seen. Color flow in the main renal artery:  Yes Color flow in the main renal vein:  Yes Duplex Doppler Evaluation: Main Renal Artery Resistive Index: 0.67 Venous waveform in main renal vein:  Present. Intrarenal resistive index in upper pole:  0.66 (normal 0.6-0.8; equivocal 0.8-0.9; abnormal >= 0.9) Intrarenal resistive index in lower pole: 0.68 (normal 0.6-0.8; equivocal 0.8-0.9; abnormal >= 0.9) Bladder: Normal for degree of bladder distention. Other findings:  Trace fullness of renal pelvis. IMPRESSION: No acute process identified. Unremarkable renal transplant Doppler ultrasound. Electronically Signed   By: Kristine Garbe M.D.   On: 12/22/2017 14:45    Pending Labs Unresulted Labs (From admission, onward)    Start     Ordered   12/23/17 7741  Basic metabolic panel  Tomorrow morning,   STAT     12/22/17 1803   12/23/17 0500  CBC  Tomorrow morning,   STAT     12/22/17 1803   12/22/17 1804  Gastrointestinal Panel by PCR , Stool  (Gastrointestinal Panel by PCR, Stool)  Once,   STAT     12/22/17 1803          Vitals/Pain Today's Vitals   12/22/17 1700 12/22/17 1730 12/22/17 1800 12/22/17 1830  BP: (!) 157/86 (!) 159/75 (!) 151/84 (!) 141/88  Pulse:  (!) 106    Resp:      Temp:      TempSrc:      SpO2:  97%    Weight:      Height:      PainSc:        Isolation Precautions Enteric precautions (UV disinfection)  Medications Medications   aspirin EC tablet 81 mg (has no administration in time range)  amLODipine (NORVASC) tablet 10 mg (has  no administration in time range)  atorvastatin (LIPITOR) tablet 20 mg (has no administration in time range)  metoprolol succinate (TOPROL-XL) 24 hr tablet 50 mg (has no administration in time range)  nortriptyline (PAMELOR) capsule 25 mg (has no administration in time range)  PARoxetine (PAXIL) tablet 20 mg (has no administration in time range)  Magnesium Oxide TABS 500 mg (has no administration in time range)  pantoprazole (PROTONIX) EC tablet 40 mg (has no administration in time range)  mycophenolate (MYFORTIC) EC tablet 360 mg (has no administration in time range)  tacrolimus (PROGRAF) capsule 1 mg (has no administration in time range)  multivitamin with minerals tablet 1 tablet (has no administration in time range)  albuterol (PROVENTIL HFA;VENTOLIN HFA) 108 (90 Base) MCG/ACT inhaler 1-2 puff (has no administration in time range)  0.9 %  sodium chloride infusion (has no administration in time range)  acetaminophen (TYLENOL) tablet 650 mg (has no administration in time range)    Or  acetaminophen (TYLENOL) suppository 650 mg (has no administration in time range)  HYDROcodone-acetaminophen (NORCO/VICODIN) 5-325 MG per tablet 1-2 tablet (has no administration in time range)  bisacodyl (DULCOLAX) EC tablet 5 mg (has no administration in time range)  ondansetron (ZOFRAN) tablet 4 mg (has no administration in time range)    Or  ondansetron (ZOFRAN) injection 4 mg (has no administration in time range)  megestrol (MEGACE) tablet 40 mg (has no administration in time range)  sodium chloride 0.9 % bolus 1,000 mL (0 mLs Intravenous Stopped 12/22/17 1237)  sodium chloride 0.9 % bolus 1,000 mL (0 mLs Intravenous Stopped 12/22/17 1717)  metoCLOPramide (REGLAN) injection 10 mg (10 mg Intravenous Given 12/22/17 1544)    Mobility walks with device

## 2017-12-22 NOTE — ED Provider Notes (Signed)
[facility] Emergency Department Provider Note  Time seen: 12:50 PM  I have reviewed the triage vital signs and the nursing notes.   HISTORY  Chief Complaint Weakness    HPI Billy Hughes is a 52 y.o. male with a past medical history of anemia, anxiety, diabetes, gastric reflux, hypertension, renal disease status post renal transplant 2009, presents to the emergency department with nausea dehydration.  According to the patient ever since his knee replacement in October he has been dealing with issues of dehydration and nausea.  States he has no appetite cannot eat, cannot drink, becomes nauseated if he attempts to drink or eat.  States he was admitted here last month, admitted to [facility] approximately 1 week ago for the same, and now is back again today feeling very dehydrated.  Patient denies any abdominal pain, states normal bowel movements last bowel movement was this morning.  Denies actually vomiting but feels very nauseated if he attempts to eat or drink.   Past Medical History:  Diagnosis Date  . Anemia   . Anxiety   . Cancer ([place])    skin  . Diabetes mellitus without complication ([place])   . Diverticulitis   . Gastric ulcer   . GERD (gastroesophageal reflux disease)   . Hypertension   . Renal disorder     Patient Active Problem List   Diagnosis Date Noted  . Renal injury 12/04/2017  . S/P total knee arthroplasty 11/12/2017  . Primary osteoarthritis of left knee 05/30/2017  . Hypertension 02/28/2017  . Renal disorder 02/28/2017  . DVT (deep venous thrombosis) ([place]) 02/28/2017  . GERD without esophagitis 02/10/2017  . Hyperlipidemia 02/10/2017  . Recurrent major depressive disorder, in partial remission ([place]) 02/10/2017  . Post concussion syndrome 03/05/2016  . Kidney transplant status 11/05/2011    Past Surgical History:  Procedure Laterality Date  . COLON RESECTION    . COLON SURGERY    . DIALYSIS FISTULA CREATION    . IVC FILTER  INSERTION Left 10/27/2017   Procedure: IVC FILTER INSERTION;  Surgeon: Algernon Huxley, MD;  Location: [facility];  Service: Cardiovascular;  Laterality: Left;  . KIDNEY TRANSPLANT    . KNEE ARTHROPLASTY Left 11/12/2017   Procedure: COMPUTER ASSISTED TOTAL KNEE ARTHROPLASTY;  Surgeon: Dereck Leep, MD;  Location: [facility];  Service: Orthopedics;  Laterality: Left;  . NASAL RECONSTRUCTION      Prior to Admission medications   Medication Sig Start Date End Date Taking? Authorizing Provider  albuterol (PROVENTIL HFA;VENTOLIN HFA) 108 (90 Base) MCG/ACT inhaler Inhale 1-2 puffs into the lungs every 6 (six) hours as needed for wheezing or shortness of breath.    [provider]  amLODipine (NORVASC) 10 MG tablet Take 10 mg by mouth at bedtime.     [provider]  aspirin EC 81 MG tablet Take 81 mg by mouth at bedtime.    [provider]  atorvastatin (LIPITOR) 20 MG tablet Take 20 mg by mouth at bedtime.    [provider]  HYDROcodone-acetaminophen (NORCO/VICODIN) 5-325 MG tablet 1-2 tabs po q 8 hours prn Patient taking differently: Take 1 tablet by mouth 2 (two) times daily as needed for moderate pain.  03/05/16   Norval Gable, MD  Magnesium Oxide 250 MG TABS Take 500 mg by mouth daily.     [provider]  metoprolol succinate (TOPROL-XL) 25 MG 24 hr tablet Take 50 mg by mouth 2 (two) times daily.    [provider]  Multiple Vitamin (MULTIVITAMIN WITH MINERALS) TABS tablet Take 1 tablet by mouth daily. 12/07/17   Fritzi Mandes, MD  mycophenolate (MYFORTIC) 180 MG EC tablet Take 360 mg by mouth 2 (two) times daily.     [provider]  nortriptyline (PAMELOR) 25 MG capsule Take 25 mg by mouth at bedtime.    [provider]  pantoprazole (PROTONIX) 40 MG tablet Take 1 tablet (40 mg total) by mouth 2 (two) times daily. 09/30/17   Earleen Newport, MD  PARoxetine (PAXIL) 20 MG tablet Take 20 mg by mouth at bedtime.      [provider]  rivaroxaban (XARELTO) 20 MG TABS tablet Take 1 tablet (20 mg total) by mouth daily with supper. Patient taking differently: Take 20 mg by mouth at bedtime.  06/02/17   Rudene Re, MD  tacrolimus (PROGRAF) 1 MG capsule Take 1 mg by mouth 2 (two) times daily.     [provider]  traMADol (ULTRAM) 50 MG tablet Take 1-2 tablets (50-100 mg total) by mouth every 4 (four) hours as needed for moderate pain. 11/13/17   Watt Climes, PA    No Known Allergies  Family History  Problem Relation Age of Onset  . Cancer Mother   . Cancer Father     Social History Social History   Tobacco Use  . Smoking status: Current Every Day Smoker    Packs/day: 1.00    Types: Cigarettes  . Smokeless tobacco: Never Used  Substance Use Topics  . Alcohol use: Not Currently    Comment: social  . Drug use: No    Review of Systems Constitutional: Negative for fever.  Feels very dehydrated per patient.  Dry mouth. Cardiovascular: Negative for chest pain. Respiratory: Negative for shortness of breath. Gastrointestinal: Negative for abdominal pain.  Positive for nausea. Genitourinary: Negative for urinary compaints Musculoskeletal: Negative for musculoskeletal complaints Skin: Negative for skin complaints  Neurological: Negative for headache All other ROS negative  ____________________________________________   PHYSICAL EXAM:  VITAL SIGNS: ED Triage Vitals  Enc Vitals Group     BP 12/22/17 1054 116/78     Pulse Rate 12/22/17 1054 (!) 135     Resp 12/22/17 1054 18     Temp 12/22/17 1054 97.8 F (36.6 C)     Temp Source 12/22/17 1054 Oral     SpO2 12/22/17 1054 99 %     Weight 12/22/17 1055 290 lb 9.1 oz (131.8 kg)     Height 12/22/17 1055 6' (1.829 m)     Head Circumference --      Peak Flow --      Pain Score 12/22/17 1055 1     Pain Loc --      Pain Edu? --      Excl. in [place]? --    Constitutional: Alert and oriented. Well appearing and in no  distress. Eyes: Normal exam ENT   Head: Normocephalic and atraumatic.   Mouth/Throat: Dry mucous membranes Cardiovascular: Normal rate, regular rhythm. No murmur Respiratory: Normal respiratory effort without tachypnea nor retractions. Breath sounds are clear Gastrointestinal: Soft and nontender. No distention.  Musculoskeletal: Nontender with normal range of motion in all extremities. Neurologic:  Normal speech and language. No gross focal neurologic deficits Skin:  Skin is warm, dry and intact.  Psychiatric: Mood and affect are normal.   ____________________________________________    EKG  EKG reviewed and interpreted by myself shows sinus tachycardia 134 bpm with a narrow QRS, normal axis,  normal intervals, nonspecific ST changes.  ____________________________________________    RADIOLOGY  Ultrasound shows no acute process.  ____________________________________________   INITIAL IMPRESSION / ASSESSMENT AND PLAN / ED COURSE  Pertinent labs & imaging results that were available during my care of the patient were reviewed by me and considered in my medical decision making (see chart for details).  Patient presents to the emergency department with dehydration, not eating or drinking very much, dry mouth.  Differential would include dehydration, renal insufficiency, nausea.  Patient's lab work has resulted showing his creatinine is elevated to 2.38, up from 1.8 on 12/18/2017 in care everywhere.  Patient's heart rate upon arrival is 135 bpm.  We will IV hydrate, obtain renal ultrasound to evaluate.  Patient sees [facility] nephrology Dr. true, we will attempt to reach Dr. Sherrian Divers or 1 of her colleagues to discuss the patient further for recommendations.  Patient agreeable to plan of care.  Ultrasound shows no acute process.  I discussed the patient with [facility], given the elevated creatinine they would like the patient admitted/transferred to [facility].  However [facility] currently has a long wait list  for bed availability, we have a patient in the ER who is been waiting 27 hours at this point for [facility] bed, and they are not able to give [place] availability status.  I discussed this with the patient, he would prefer to be admitted locally in that case.  I believe this is reasonable given a normal ultrasound.  And symptoms appear to be very related to dehydration.  Patient will be admitted to [facility] for rehydration.  We will follow-up with [facility] nephrology after discharge. ____________________________________________   FINAL CLINICAL IMPRESSION(S) / ED DIAGNOSES  Renal insufficiency Dehydration    Harvest Dark, MD 12/22/17 1537

## 2017-12-22 NOTE — ED Notes (Signed)
Patient given ginger ale to drink per MD.

## 2017-12-22 NOTE — ED Notes (Signed)
ED TO INPATIENT HANDOFF REPORT  Name/Age/Gender Billy Hughes 52 y.o. male  Code Status    Code Status Orders  (From admission, onward)         Start     Ordered   12/22/17 1802  Full code  Continuous     12/22/17 1803        Code Status History    Date Active Date Inactive Code Status Order ID Comments User Context   12/04/2017 1749 12/06/2017 1550 Full Code [id number]  Bettey Costa, MD ED   11/12/2017 1647 11/14/2017 1517 Full Code [id number]  Hooten, Laurice Record, MD Inpatient      Home/SNF/Other Home  Chief Complaint knee replacement oct, no appetite, lost 40 lbs 3 wks   Level of Care/Admitting Diagnosis ED Disposition    ED Disposition Condition [place]: [place] [[id number]]  Level of Care: Med-Surg [16]  Diagnosis: Acute renal failure (ARF) [facility]) [[id number]]  Admitting Physician: Epifanio Lesches [[id number]]  Attending Physician: Epifanio Lesches [[id number]]  Estimated length of stay: past midnight tomorrow  Certification:: I certify this patient will need inpatient services for at least 2 midnights  PT Class (Do Not Modify): Inpatient [101]  PT Acc Code (Do Not Modify): Private [1]       Medical History Past Medical History:  Diagnosis Date  . Anemia   . Anxiety   . Cancer ([place])    skin  . Diabetes mellitus without complication ([place])   . Diverticulitis   . Gastric ulcer   . GERD (gastroesophageal reflux disease)   . Hypertension   . Renal disorder     Allergies No Known Allergies  IV Location/Drains/Wounds Patient Lines/Drains/Airways Status   Active Line/Drains/Airways    Name:   Placement date:   Placement time:   Site:   Days:   Peripheral IV 12/22/17 Right Antecubital   12/22/17    1103    Antecubital   less than 1   Incision (Closed) 11/12/17 Knee Left   11/12/17    1342     40          Labs/Imaging Results for orders placed or performed during the hospital encounter of 12/22/17 (from  the past 48 hour(s))  Basic metabolic panel     Status: Abnormal   Collection Time: 12/22/17 11:04 AM  Result Value Ref Range   Sodium 133 (L) 135 - 145 mmol/L   Potassium 4.2 3.5 - 5.1 mmol/L   Chloride 100 98 - 111 mmol/L   CO2 21 (L) 22 - 32 mmol/L   Glucose, Bld 121 (H) 70 - 99 mg/dL   BUN 23 (H) 6 - 20 mg/dL   Creatinine, Ser 2.38 (H) 0.61 - 1.24 mg/dL   Calcium 10.3 8.9 - 10.3 mg/dL   GFR calc non Af Amer 30 (L) >60 mL/min   GFR calc Af Amer 35 (L) >60 mL/min   Anion gap 12 5 - 15    Comment: Performed at [facility], [address] [place], [place] [address]  CBC     Status: Abnormal   Collection Time: 12/22/17 11:04 AM  Result Value Ref Range   WBC 11.4 (H) 4.0 - 10.5 K/uL   RBC 4.98 4.22 - 5.81 MIL/uL   Hemoglobin 14.1 13.0 - 17.0 g/dL   HCT 43.2 39.0 - 52.0 %   MCV 86.7 80.0 - 100.0 fL   MCH 28.3 26.0 - 34.0 pg   MCHC 32.6 30.0 -  36.0 g/dL   RDW 15.5 11.5 - 15.5 %   Platelets 252 150 - 400 K/uL   nRBC 0.0 0.0 - 0.2 %    Comment: Performed at [facility], [address] [place], [place] [address]  Urinalysis, Complete w Microscopic     Status: Abnormal   Collection Time: 12/22/17  4:29 PM  Result Value Ref Range   Color, Urine YELLOW (A) YELLOW   APPearance CLEAR (A) CLEAR   Specific Gravity, Urine 1.010 1.005 - 1.030   pH 5.0 5.0 - 8.0   Glucose, UA NEGATIVE NEGATIVE mg/dL   Hgb urine dipstick SMALL (A) NEGATIVE   Bilirubin Urine NEGATIVE NEGATIVE   Ketones, ur 5 (A) NEGATIVE mg/dL   Protein, ur NEGATIVE NEGATIVE mg/dL   Nitrite NEGATIVE NEGATIVE   Leukocytes, UA NEGATIVE NEGATIVE   RBC / HPF 6-10 0 - 5 RBC/hpf   WBC, UA 0-5 0 - 5 WBC/hpf   Bacteria, UA NONE SEEN NONE SEEN   Squamous Epithelial / LPF 0-5 0 - 5   Mucus PRESENT    Hyaline Casts, UA PRESENT     Comment: Performed at [facility], [address], [place], [place] [address]   US Renal Transplant W/doppler  Result Date: 12/22/2017 CLINICAL DATA:  52 y/o M;  renal transplant August of 2009. Elevated creatinine. EXAM: ULTRASOUND OF RENAL TRANSPLANT WITH RENAL DOPPLER ULTRASOUND TECHNIQUE: Ultrasound examination of the renal transplant was performed with gray-scale, color and duplex doppler evaluation. COMPARISON:  12/04/2017 renal ultrasound FINDINGS: Transplant kidney location: Right lower quadrant Transplant Kidney: Renal measurements: 12.9 x 6.4 x 6.6 cm = volume: 286.56mL. Normal in size and parenchymal echogenicity. Upper pole simple cyst measuring 9 mm. No peri-transplant fluid collection seen. Color flow in the main renal artery:  Yes Color flow in the main renal vein:  Yes Duplex Doppler Evaluation: Main Renal Artery Resistive Index: 0.67 Venous waveform in main renal vein:  Present. Intrarenal resistive index in upper pole:  0.66 (normal 0.6-0.8; equivocal 0.8-0.9; abnormal >= 0.9) Intrarenal resistive index in lower pole: 0.68 (normal 0.6-0.8; equivocal 0.8-0.9; abnormal >= 0.9) Bladder: Normal for degree of bladder distention. Other findings:  Trace fullness of renal pelvis. IMPRESSION: No acute process identified. Unremarkable renal transplant Doppler ultrasound. Electronically Signed   By: Kristine Garbe M.D.   On: 12/22/2017 14:45    Pending Labs Unresulted Labs (From admission, onward)    Start     Ordered   12/23/17 7341  Basic metabolic panel  Tomorrow morning,   STAT     12/22/17 1803   12/23/17 0500  CBC  Tomorrow morning,   STAT     12/22/17 1803   12/22/17 1804  Gastrointestinal Panel by PCR , Stool  (Gastrointestinal Panel by PCR, Stool)  Once,   STAT     12/22/17 1803          Vitals/Pain Today's Vitals   12/22/17 1730 12/22/17 1800 12/22/17 1830 12/22/17 1900  BP: (!) 159/75 (!) 151/84 (!) 141/88 (!) 162/87  Pulse: (!) 106     Resp:      Temp:      TempSrc:      SpO2: 97%     Weight:      Height:      PainSc:        Isolation Precautions Enteric precautions (UV disinfection)  Medications Medications   aspirin EC tablet 81 mg (has no administration in time range)  amLODipine (NORVASC) tablet 10 mg (has  no administration in time range)  atorvastatin (LIPITOR) tablet 20 mg (has no administration in time range)  metoprolol succinate (TOPROL-XL) 24 hr tablet 50 mg (has no administration in time range)  nortriptyline (PAMELOR) capsule 25 mg (has no administration in time range)  PARoxetine (PAXIL) tablet 20 mg (has no administration in time range)  Magnesium Oxide TABS 500 mg (has no administration in time range)  pantoprazole (PROTONIX) EC tablet 40 mg (has no administration in time range)  mycophenolate (MYFORTIC) EC tablet 360 mg (has no administration in time range)  tacrolimus (PROGRAF) capsule 1 mg (has no administration in time range)  multivitamin with minerals tablet 1 tablet (has no administration in time range)  albuterol (PROVENTIL) (2.5 MG/3ML) 0.083% nebulizer solution 2.5 mg (has no administration in time range)  0.9 %  sodium chloride infusion (has no administration in time range)  acetaminophen (TYLENOL) tablet 650 mg (has no administration in time range)    Or  acetaminophen (TYLENOL) suppository 650 mg (has no administration in time range)  HYDROcodone-acetaminophen (NORCO/VICODIN) 5-325 MG per tablet 1-2 tablet (has no administration in time range)  bisacodyl (DULCOLAX) EC tablet 5 mg (has no administration in time range)  ondansetron (ZOFRAN) tablet 4 mg (has no administration in time range)    Or  ondansetron (ZOFRAN) injection 4 mg (has no administration in time range)  megestrol (MEGACE) tablet 40 mg (has no administration in time range)  sodium chloride 0.9 % bolus 1,000 mL (0 mLs Intravenous Stopped 12/22/17 1237)  sodium chloride 0.9 % bolus 1,000 mL (0 mLs Intravenous Stopped 12/22/17 1717)  metoCLOPramide (REGLAN) injection 10 mg (10 mg Intravenous Given 12/22/17 1544)    Mobility walks with device   Report given to [employer].

## 2017-12-22 NOTE — ED Triage Notes (Addendum)
Pt arrived via POV with reports of dehydration, weight loss of 40 lbs since Oct 30 and overall generalized weakness.  Pt states he has had decreased PO intake since Oct 30.  Pt has hx of kidney transplant in 2009

## 2017-12-22 NOTE — H&P (Signed)
[facility] [place] NAME: Billy Hughes    MR#:  [id number]  DATE OF BIRTH:  November 09, 1965  DATE OF ADMISSION:  12/22/2017  PRIMARY CARE PHYSICIAN: Sofie Hartigan, MD   REQUESTING/REFERRING PHYSICIAN: Dr. Harvest Dark  CHIEF COMPLAINT: Weakness   Chief Complaint  Patient presents with  . Weakness    HISTORY OF PRESENT ILLNESS:  Billy Hughes  is a 52 y.o. male with a known history of anxiety, diabetes mellitus type 2, GERD, hypertension, history of kidney transplant in 2009 on immunosuppressants comes in because of decreased p.o. intake, nausea, generalized weakness.  Patient has been having decreased p.o. intake, loss of appetite since left knee replacement in October.  Patient has been dealing with episodes of dehydration, nausea, poor appetite since October and according to wife he lost about 30 pounds in about 2 months.  Unable to keep anything down because of nausea and he feels like his mouth is likely cardboard.  Denies nausea, vomiting.  Patient had 1 loose stool in the emergency room today.  Denies any nausea, vomiting, abdominal pain.  Patient had one episode of loose stool in the emergency room today.  PAST MEDICAL HISTORY:   Past Medical History:  Diagnosis Date  . Anemia   . Anxiety   . Cancer ([place])    skin  . Diabetes mellitus without complication ([place])   . Diverticulitis   . Gastric ulcer   . GERD (gastroesophageal reflux disease)   . Hypertension   . Renal disorder     PAST SURGICAL HISTOIRY:   Past Surgical History:  Procedure Laterality Date  . COLON RESECTION    . COLON SURGERY    . DIALYSIS FISTULA CREATION    . IVC FILTER INSERTION Left 10/27/2017   Procedure: IVC FILTER INSERTION;  Surgeon: Algernon Huxley, MD;  Location: [facility];  Service: Cardiovascular;  Laterality: Left;  . KIDNEY TRANSPLANT    . KNEE ARTHROPLASTY Left 11/12/2017   Procedure: COMPUTER ASSISTED TOTAL KNEE  ARTHROPLASTY;  Surgeon: Dereck Leep, MD;  Location: [facility];  Service: Orthopedics;  Laterality: Left;  . NASAL RECONSTRUCTION      SOCIAL HISTORY:   Social History   Tobacco Use  . Smoking status: Current Every Day Smoker    Packs/day: 1.00    Types: Cigarettes  . Smokeless tobacco: Never Used  Substance Use Topics  . Alcohol use: Not Currently    Comment: social    FAMILY HISTORY:   Family History  Problem Relation Age of Onset  . Cancer Mother   . Cancer Father     DRUG ALLERGIES:  No Known Allergies  REVIEW OF SYSTEMS:  CONSTITUTIONAL: No fever, .  Patient appears chronically ill, does have fatigue, feeling dizzy recently when he gets up. EYES: No blurred or double vision.  EARS, NOSE, AND THROAT: No tinnitus or ear pain.  RESPIRATORY: No cough, shortness of breath, wheezing or hemoptysis.  CARDIOVASCULAR: No chest pain, orthopnea, edema.  GASTROINTESTINAL: No nausea, vomiting, has poor appetite.  GENITOURINARY: No dysuria, hematuria.  ENDOCRINE: No polyuria, nocturia,  HEMATOLOGY: No anemia, easy bruising or bleeding SKIN: No rash or lesion. MUSCULOSKELETAL: Left knee replacement in October  nEUROLOGIC: No tingling, numbness, weakness.  PSYCHIATRY: No anxiety or depression.   MEDICATIONS AT HOME:   Prior to Admission medications   Medication Sig Start Date End Date Taking? Authorizing Provider  acetaminophen (TYLENOL) 500 MG tablet Take 1,000 mg by mouth  every 8 (eight) hours as needed for pain. 12/14/17  Yes [provider]  albuterol (PROVENTIL HFA;VENTOLIN HFA) 108 (90 Base) MCG/ACT inhaler Inhale 1-2 puffs into the lungs every 6 (six) hours as needed for wheezing or shortness of breath.   Yes [provider]  amLODipine (NORVASC) 10 MG tablet Take 10 mg by mouth at bedtime.    Yes [provider]  aspirin EC 81 MG tablet Take 81 mg by mouth at bedtime.   Yes [provider]  atorvastatin (LIPITOR) 20 MG tablet Take  20 mg by mouth at bedtime.   Yes [provider]  Magnesium Oxide 250 MG TABS Take 500 mg by mouth daily.    Yes [provider]  metoprolol succinate (TOPROL-XL) 25 MG 24 hr tablet Take 50 mg by mouth 2 (two) times daily.   Yes [provider]  Multiple Vitamin (MULTIVITAMIN WITH MINERALS) TABS tablet Take 1 tablet by mouth daily. 12/07/17  Yes Fritzi Mandes, MD  mycophenolate (MYFORTIC) 180 MG EC tablet Take 360 mg by mouth 2 (two) times daily.    Yes [provider]  nortriptyline (PAMELOR) 25 MG capsule Take 25 mg by mouth at bedtime.   Yes [provider]  pantoprazole (PROTONIX) 40 MG tablet Take 1 tablet (40 mg total) by mouth 2 (two) times daily. 09/30/17  Yes Earleen Newport, MD  PARoxetine (PAXIL) 20 MG tablet Take 20 mg by mouth at bedtime.    Yes [provider]  rivaroxaban (XARELTO) 20 MG TABS tablet Take 1 tablet (20 mg total) by mouth daily with supper. Patient taking differently: Take 20 mg by mouth at bedtime.  06/02/17  Yes Veronese, [place], MD  spironolactone (ALDACTONE) 25 MG tablet Take 25 mg by mouth daily.   Yes [provider]  tacrolimus (PROGRAF) 1 MG capsule Take 1 mg by mouth 2 (two) times daily.    Yes [provider]  traMADol (ULTRAM) 50 MG tablet Take 1-2 tablets (50-100 mg total) by mouth every 4 (four) hours as needed for moderate pain. 11/13/17  Yes Watt Climes, PA  HYDROcodone-acetaminophen (NORCO/VICODIN) 5-325 MG tablet 1-2 tabs po q 8 hours prn Patient not taking: Reported on 12/22/2017 03/05/16   Norval Gable, MD      VITAL SIGNS:  Blood pressure (!) 159/83, pulse (!) 110, temperature 97.7 F (36.5 C), temperature source Oral, resp. rate (!) 23, height 6' (1.829 m), weight 131.8 kg, SpO2 98 %.  PHYSICAL EXAMINATION:  GENERAL:  52 y.o.-year-old patient lying in the bed with no acute distress.  EYES: Pupils equal, round, reactive to light and accommodation. No scleral icterus.  Extraocular muscles intact.  HEENT: Head atraumatic, normocephalic. Oropharynx and nasopharynx clear.  NECK:  Supple, no jugular venous distention. No thyroid enlargement, no tenderness.  LUNGS: Normal breath sounds bilaterally, no wheezing, rales,rhonchi or crepitation. No use of accessory muscles of respiration.  CARDIOVASCULAR: S1, S2 normal. No murmurs, rubs, or gallops.  ABDOMEN: Soft, nontender, nondistended. Bowel sounds present. No organomegaly or mass.  EXTREMITIES: No pedal edema, cyanosis, or clubbing.  NEUROLOGIC: Cranial nerves II through XII are intact. Muscle strength 5/5 in all extremities. Sensation intact. Gait not checked.  PSYCHIATRIC: The patient is alert and oriented x 3.  SKIN: No obvious rash, lesion, or ulcer.   LABORATORY PANEL:   CBC Recent Labs  Lab 12/22/17 1104  WBC 11.4*  HGB 14.1  HCT 43.2  PLT 252   ------------------------------------------------------------------------------------------------------------------  Chemistries  Recent Labs  Lab 12/22/17 1104  NA 133*  K 4.2  CL 100  CO2 21*  GLUCOSE 121*  BUN 23*  CREATININE 2.38*  CALCIUM 10.3   ------------------------------------------------------------------------------------------------------------------  Cardiac Enzymes No results for input(s): TROPONINI in the last 168 hours. ------------------------------------------------------------------------------------------------------------------  RADIOLOGY:  US Renal Transplant W/doppler  Result Date: 12/22/2017 CLINICAL DATA:  52 y/o M; renal transplant August of 2009. Elevated creatinine. EXAM: ULTRASOUND OF RENAL TRANSPLANT WITH RENAL DOPPLER ULTRASOUND TECHNIQUE: Ultrasound examination of the renal transplant was performed with gray-scale, color and duplex doppler evaluation. COMPARISON:  12/04/2017 renal ultrasound FINDINGS: Transplant kidney location: Right lower quadrant Transplant Kidney: Renal measurements: 12.9 x 6.4 x 6.6 cm =  volume: 286.66mL. Normal in size and parenchymal echogenicity. Upper pole simple cyst measuring 9 mm. No peri-transplant fluid collection seen. Color flow in the main renal artery:  Yes Color flow in the main renal vein:  Yes Duplex Doppler Evaluation: Main Renal Artery Resistive Index: 0.67 Venous waveform in main renal vein:  Present. Intrarenal resistive index in upper pole:  0.66 (normal 0.6-0.8; equivocal 0.8-0.9; abnormal >= 0.9) Intrarenal resistive index in lower pole: 0.68 (normal 0.6-0.8; equivocal 0.8-0.9; abnormal >= 0.9) Bladder: Normal for degree of bladder distention. Other findings:  Trace fullness of renal pelvis. IMPRESSION: No acute process identified. Unremarkable renal transplant Doppler ultrasound. Electronically Signed   By: Kristine Garbe M.D.   On: 12/22/2017 14:45    EKG:   Orders placed or performed during the hospital encounter of 12/22/17  . ED EKG  . ED EKG  . EKG 12-Lead  . EKG 12-Lead   Sinus tachycardia with heart rate up to 134 bpm. IMPRESSION AND PLAN:  52 year old male patient who underwent kidney transplant in 2009, has history of CAD, depression, neuropathy, essential hypertension comes in because of generalized weakness, nausea, poor p.o. intake.  Found to have acute on chronic renal failure, creatinine increased to 2.38 and 1.45 recently 2 weeks ago.  Acute renal failure chronic renal failure secondary to dehydration, poor p.o. intake, has been dealing with this problem since October, recently was admitted to [facility] for similar problem and was there for 4 days.  Time he was given antibiotics for possible pneumonia patient just finished antibiotics on December 2.  So for now continue IV fluids till tomorrow.  Patient admitted to [facility] from 11 27-12 1. 2.  Anorexia, malaise.  Start applied stimulant to see if they help. 3.  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  Patient has been accepted to [facility] but no beds available and patient been waiting for 27 hours and patient is agreeable to stay here. 4 .history of left popliteal, distal superficial femoral DVT diagnosed in January 2019, patient had IVC filter, #5 hyperlipidemia: Continue statins 6.  New onset diabetes mellitus type 2: Continue sliding scale insulin with coverage 7.  Diarrhea: Check stool for C. difficile. 8.  Dry mouth, suspecting coordination letter making the dry mouth and altered taste, [facility] nephrology recommended to stop Nicotrol Inhaler,  All the records are reviewed and case discussed with ED provider. Management plans discussed with the patient, family and they are in agreement.  CODE STATUS: Full code TOTAL TIME TAKING CARE OF THIS PATIENT: 55 minutes.    Epifanio Lesches M.D on 12/22/2017 at 6:22 PM  Between 7am to 6pm - Pager - [phone]  After 6pm go to [email or web address] - password Child psychotherapist [facility]  Office  [phone]  CC: Primary care physician; Sofie Hartigan, MD  Note: This dictation was prepared with Dragon dictation along with smaller phrase technology. Any transcriptional errors that result from this process are unintentional.

## 2017-12-22 NOTE — ED Notes (Signed)
Called [facility] transfer center for transfer  [phone]

## 2017-12-22 NOTE — ED Notes (Signed)
Patient reports having knee replacement on 10/30. States since surgery, he has had decreased appetite and PO intake with 40+ lb weight loss. Patient also reports generalized weakness. States he had kidney transplant in 2009 and is afraid he is dehydrated.

## 2017-12-22 NOTE — ED Notes (Signed)
Patient took home tramadol 50 mg @ 1546.  EDP aware.

## 2017-12-22 NOTE — ED Notes (Signed)
Patient transported to [facility] 

## 2017-12-23 LAB — BASIC METABOLIC PANEL
ANION GAP: 2 — AB (ref 5–15)
BUN: 17 mg/dL (ref 6–20)
CALCIUM: 9.4 mg/dL (ref 8.9–10.3)
CO2: 22 mmol/L (ref 22–32)
Chloride: 112 mmol/L — ABNORMAL HIGH (ref 98–111)
Creatinine, Ser: 1.58 mg/dL — ABNORMAL HIGH (ref 0.61–1.24)
GFR calc non Af Amer: 49 mL/min — ABNORMAL LOW (ref >60)
GFR, EST AFRICAN AMERICAN: 57 mL/min — AB (ref >60)
Glucose, Bld: 113 mg/dL — ABNORMAL HIGH (ref 70–99)
Potassium: 3.7 mmol/L (ref 3.5–5.1)
Sodium: 136 mmol/L (ref 135–145)

## 2017-12-23 LAB — CBC
HCT: 37.9 % — ABNORMAL LOW (ref 39.0–52.0)
Hemoglobin: 12 g/dL — ABNORMAL LOW (ref 13.0–17.0)
MCH: 27.8 pg (ref 26.0–34.0)
MCHC: 31.7 g/dL (ref 30.0–36.0)
MCV: 87.7 fL (ref 80.0–100.0)
Platelets: 192 10*3/uL (ref 150–400)
RBC: 4.32 MIL/uL (ref 4.22–5.81)
RDW: 15.6 % — ABNORMAL HIGH (ref 11.5–15.5)
WBC: 8.3 10*3/uL (ref 4.0–10.5)
nRBC: 0 % (ref 0.0–0.2)

## 2017-12-23 LAB — GLUCOSE, CAPILLARY: Glucose-Capillary: 136 mg/dL — ABNORMAL HIGH (ref 70–99)

## 2017-12-23 MED ORDER — MEGESTROL ACETATE 40 MG PO TABS: 40.0000 mg | ORAL_TABLET | Freq: Every day | ORAL | 0 refills | Status: DC

## 2017-12-23 NOTE — Progress Notes (Signed)
Paged by RN to see patient with new diagnosis of DM.  Met with pt this afternoon.  Pt told me he knew he was having issues with his glucose levels but was not told by previous physician at [facility] that he had DM.  Was given CBG meter at last admission but has not started using it yet.  Spoke with pt about new diagnosis.  Discussed A1C results with him (was 6.8% during admission at [facility]) and explained what an A1C is, basic pathophysiology of DM Type 2, basic home care, basic diabetes diet nutrition principles, importance of checking CBGs and maintaining good CBG control to prevent long-term and short-term complications.  Reviewed signs and symptoms of hyperglycemia and hypoglycemia and how to treat hypoglycemia at home.  Also reviewed blood sugar goals and A1c goals for home.    Requested pt check his CBGs several times per week (either in the AM before his first meal or 2 hours after a meal) and to record all CBGs.  Also discussed DM diet information with patient.  Encouraged patient to avoid beverages with sugar (regular soda, sweet tea, lemonade, fruit juice) and to consume mostly water.  Discussed what foods contain carbohydrates and how carbohydrates affect the body's blood sugar levels.  Encouraged patient to be careful with his portion sizes (especially grains, starchy vegetables, and fruits).  Offered patient an outpatient referral to the [facility] to see a Certified Diabetes Educator after d/c for additional diabetes counseling.  Pt told me he would like to defer until a later time.  Asked pt to please request a referral from his PCP if he changes his mind.   --Will follow patient during hospitalization--  Wyn Quaker RN, MSN, CDE Diabetes Coordinator Inpatient Glycemic Control Team Team Pager: [phone] (8a-5p)

## 2017-12-23 NOTE — Care Management (Signed)
There was an order for CM consult for equipment / discharge planning.  Patient left the unit before caremanager could speak with him. He did speak with the Diabetes Coordinator as he has a new diagnosis of diabetes. He declined to wait for the CM

## 2017-12-23 NOTE — Discharge Instructions (Signed)
Follow-up with primary care physician in 3 days Follow-up with gastroenterology Dr. Vicente Males in 4 to 5 days Follow-up with Dr. Marry Guan as scheduled Follow-up with [facility] nephrology as scheduled

## 2017-12-23 NOTE — Discharge Summary (Signed)
[facility] [place] NAME: Billy Hughes    MR#:  [id number]  DATE OF BIRTH:  1965-11-02  DATE OF ADMISSION:  12/22/2017 ADMITTING PHYSICIAN: Epifanio Lesches, MD  DATE OF DISCHARGE:  12/23/17  PRIMARY CARE PHYSICIAN: Sofie Hartigan, MD    ADMISSION DIAGNOSIS:  Acute renal insufficiency [N28.9] Elevated serum creatinine [R79.89] Renal transplant recipient [Z94.0]  DISCHARGE DIAGNOSIS:  Active Problems:   Acute renal failure (ARF) ([place])   SECONDARY DIAGNOSIS:   Past Medical History:  Diagnosis Date  . Anemia   . Anxiety   . Cancer ([place])    skin  . Diabetes mellitus without complication ([place])   . Diverticulitis   . Gastric ulcer   . GERD (gastroesophageal reflux disease)   . Hypertension   . Renal disorder     HOSPITAL COURSE:  HPI  Billy Hughes  is a 52 y.o. male with a known history of anxiety, diabetes mellitus type 2, GERD, hypertension, history of kidney transplant in 2009 on immunosuppressants comes in because of decreased p.o. intake, nausea, generalized weakness.  Patient has been having decreased p.o. intake, loss of appetite since left knee replacement in October.  Patient has been dealing with episodes of dehydration, nausea, poor appetite since October and according to wife he lost about 30 pounds in about 2 months.  Unable to keep anything down because of nausea and he feels like his mouth is likely cardboard.  Denies nausea, vomiting.  Patient had 1 loose stool in the emergency room today.  Denies any nausea, vomiting, abdominal pain.  Patient had one episode of loose stool in the emergency room today. Hospital course Acute renal failure on  chronic renal failure secondary to dehydration, poor p.o. intake, has been dealing with this problem since October, recently was admitted to [facility] for similar problem and was there for 4 days.  Time he was given antibiotics for possible pneumonia patient  just finished antibiotics on December 2.  Patient admitted to [facility] from 11 27-12 1. Clinically improved with IV fluids creatinine trended down from 2.38-1.5 Patient denies any vomiting or dizzy spells.  Baseline creatinine seems to be at 1.45.  Will discharge patient home and patient prefers following up with [facility] nephrology as an outpatient 2.    Nausea with anorexia, malaise.  Started dietary stimulant to see if they help.  Patient never had EGD done.  Needs outpatient GI follow-up to rule out peptic ulcer disease.  Patient is to take PPI on daily basis 3.  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  New onset diabetes mellitus type 2: Accu-Cheks are between 99- 140.  Will provide diabetic diet education and outpatient follow-up new onset diabetes could be the etiology for his weight loss in addition to possible peptic ulcer disease  continued sliding scale insulin with coverage 7.  Diarrhea: Patient reports he just had one soft bowel movement yesterday after that no bowel movements and tolerating diet fine could not check check stool for C. Difficile/GI panel as patient did not  have any other bowel movements 8.  Dry mouth, suspecting coordination letter making the dry mouth and altered taste, [facility] nephrology recommended to stop Nicotrol Inhaler,   DISCHARGE CONDITIONS:   stable  CONSULTS OBTAINED:     PROCEDURES  None   DRUG ALLERGIES:  No Known Allergies  DISCHARGE MEDICATIONS:   Allergies as of 12/23/2017   No Known Allergies      Medication List    STOP taking these medications   HYDROcodone-acetaminophen 5-325 MG tablet Commonly known as:  NORCO/VICODIN     TAKE these medications   acetaminophen 500 MG tablet Commonly known as:  TYLENOL Take 1,000 mg by mouth every 8 (eight) hours as needed for pain.   albuterol 108 (90 Base) MCG/ACT inhaler Commonly known as:  PROVENTIL HFA;VENTOLIN HFA Inhale 1-2 puffs into the lungs every 6 (six) hours as needed for wheezing or shortness of breath.   amLODipine 10 MG tablet Commonly known as:  NORVASC Take 10 mg by mouth at bedtime.   aspirin EC 81 MG tablet Take 81 mg by mouth at bedtime.   atorvastatin 20 MG tablet Commonly known as:  LIPITOR Take 20 mg by mouth at bedtime.   Magnesium Oxide 250 MG Tabs Take 500 mg by mouth daily.   megestrol 40 MG tablet Commonly known as:  MEGACE Take 1 tablet (40 mg total) by mouth daily. Start taking on:  12/24/2017   metoprolol succinate 25 MG 24 hr tablet Commonly known as:  TOPROL-XL Take 50 mg by mouth 2 (two) times daily.   multivitamin with minerals Tabs tablet Take 1 tablet by mouth daily.   mycophenolate 180 MG EC tablet Commonly known as:  MYFORTIC Take 360 mg by mouth 2 (two) times daily.   nortriptyline 25 MG capsule Commonly known as:  PAMELOR Take 25 mg by mouth at bedtime.   pantoprazole 40 MG tablet Commonly known as:  PROTONIX Take 1 tablet (40 mg total) by mouth 2 (two) times daily.   PARoxetine 20 MG tablet Commonly known as:  PAXIL Take 20 mg by mouth at bedtime.   rivaroxaban 20 MG Tabs tablet Commonly known as:  XARELTO Take 1 tablet (20 mg total) by mouth daily with supper. What changed:  when to take this   spironolactone 25 MG tablet Commonly known as:  ALDACTONE Take 25 mg by mouth daily.   tacrolimus 1 MG capsule Commonly known as:  PROGRAF Take 1 mg by mouth 2 (two) times daily.   traMADol 50 MG tablet Commonly known as:  ULTRAM Take 1-2 tablets (50-100 mg total)  by mouth every 4 (four) hours as needed for moderate pain.        DISCHARGE INSTRUCTIONS:   Follow-up with primary care physician in 3 days Follow-up with gastroenterology Dr. Vicente Males in 4 to 5 days Follow-up with Dr. Marry Guan as scheduled Follow-up with [facility] nephrology as scheduled   DIET:  Diabetic diet  DISCHARGE CONDITION:  Stable  ACTIVITY:  Activity as tolerated  OXYGEN:  Home Oxygen: No.   Oxygen Delivery: room air  DISCHARGE LOCATION:  home   If you experience worsening of your admission symptoms, develop shortness of breath, life threatening emergency, suicidal or homicidal thoughts you must seek medical attention immediately by calling 911 or calling your MD immediately  if symptoms less severe.  You Must read complete instructions/literature along with all the possible adverse reactions/side effects for all the Medicines you take and that have been prescribed to you. Take any new Medicines  after you have completely understood and accpet all the possible adverse reactions/side effects.   Please note  You were cared for by a hospitalist during your hospital stay. If you have any questions about your discharge medications or the care you received while you were in the hospital after you are discharged, you can call the unit and asked to speak with the hospitalist on call if the hospitalist that took care of you is not available. Once you are discharged, your primary care physician will handle any further medical issues. Please note that NO REFILLS for any discharge medications will be authorized once you are discharged, as it is imperative that you return to your primary care physician (or establish a relationship with a primary care physician if you do not have one) for your aftercare needs so that they can reassess your need for medications and monitor your lab values.     Today  Chief Complaint  Patient presents with  . Weakness   Pt is feeling fine, denies  abdominal pain or vomiting or diarrhea. Feels nauseous but tolerating diet.  Wants to go home and follow-up with nephrology as an outpatient  ROS:  CONSTITUTIONAL: Denies fevers, chills. Denies any fatigue, weakness.  EYES: Denies blurry vision, double vision, eye pain. EARS, NOSE, THROAT: Denies tinnitus, ear pain, hearing loss. RESPIRATORY: Denies cough, wheeze, shortness of breath.  CARDIOVASCULAR: Denies chest pain, palpitations, edema.  GASTROINTESTINAL: Denies nausea, vomiting, diarrhea, abdominal pain. Denies bright red blood per rectum. GENITOURINARY: Denies dysuria, hematuria. ENDOCRINE: Denies nocturia or thyroid problems. HEMATOLOGIC AND LYMPHATIC: Denies easy bruising or bleeding. SKIN: Denies rash or lesion. MUSCULOSKELETAL: Denies pain in neck, back, shoulder, knees, hips or arthritic symptoms.  NEUROLOGIC: Denies paralysis, paresthesias.  PSYCHIATRIC: Denies anxiety or depressive symptoms.   VITAL SIGNS:  Blood pressure 128/76, pulse 78, temperature (!) 97.5 F (36.4 C), temperature source Oral, resp. rate 20, height 6' (1.829 m), weight 50 kg, SpO2 96 %.  I/O:    Intake/Output Summary (Last 24 hours) at 12/23/2017 1201 Last data filed at 12/23/2017 0533 Gross per 24 hour  Intake 1657.68 ml  Output 1 ml  Net 1656.68 ml    PHYSICAL EXAMINATION:  GENERAL:  52 y.o.-year-old patient lying in the bed with no acute distress.  EYES: Pupils equal, round, reactive to light and accommodation. No scleral icterus. Extraocular muscles intact.  HEENT: Head atraumatic, normocephalic. Oropharynx and nasopharynx clear.  NECK:  Supple, no jugular venous distention. No thyroid enlargement, no tenderness.  LUNGS: Normal breath sounds bilaterally, no wheezing, rales,rhonchi or crepitation. No use of accessory muscles of respiration.  CARDIOVASCULAR: S1, S2 normal. No murmurs, rubs, or gallops.  ABDOMEN: Soft, non-tender, non-distended. Bowel sounds present. No organomegaly or mass.   EXTREMITIES: No pedal edema, cyanosis, or clubbing.  NEUROLOGIC: Cranial nerves II through XII are intact. Muscle strength 5/5 in all extremities. Sensation intact. Gait not checked.  PSYCHIATRIC: The patient is alert and oriented x 3.  SKIN: No obvious rash, lesion, or ulcer.   DATA REVIEW:   CBC Recent Labs  Lab 12/23/17 0434  WBC 8.3  HGB 12.0*  HCT 37.9*  PLT 192    Chemistries  Recent Labs  Lab 12/23/17 0434  NA 136  K 3.7  CL 112*  CO2 22  GLUCOSE 113*  BUN 17  CREATININE 1.58*  CALCIUM 9.4    Cardiac Enzymes No results for input(s): TROPONINI in the last 168 hours.  Microbiology Results  Results for orders placed or performed during  the hospital encounter of 10/29/17  Urine culture     Status: None   Collection Time: 10/29/17  7:45 AM  Result Value Ref Range Status   Specimen Description   Final    URINE, RANDOM Performed at [facility], [address], [place], [place] [address]    Special Requests   Final    Normal Performed at [facility], [address], [place], [place] [address]    Culture   Final    NO GROWTH Performed at [facility], [place] [address], [place], [place] [address]    Report Status 10/30/2017 FINAL  Final  Surgical pcr screen     Status: None   Collection Time: 10/29/17  9:05 AM  Result Value Ref Range Status   MRSA, PCR NEGATIVE NEGATIVE Final   Staphylococcus aureus NEGATIVE NEGATIVE Final    Comment: (NOTE) The Xpert SA Assay (FDA approved for NASAL specimens in patients 51 years of age and older), is one component of a comprehensive surveillance program. It is not intended to diagnose infection nor to guide or monitor treatment. Performed at [facility], [address], [place], [place] [address]     RADIOLOGY:  US Renal Transplant W/doppler  Result Date: 12/22/2017 CLINICAL DATA:  52 y/o M; renal transplant August of 2009. Elevated creatinine. EXAM: ULTRASOUND OF RENAL  TRANSPLANT WITH RENAL DOPPLER ULTRASOUND TECHNIQUE: Ultrasound examination of the renal transplant was performed with gray-scale, color and duplex doppler evaluation. COMPARISON:  12/04/2017 renal ultrasound FINDINGS: Transplant kidney location: Right lower quadrant Transplant Kidney: Renal measurements: 12.9 x 6.4 x 6.6 cm = volume: 286.60mL. Normal in size and parenchymal echogenicity. Upper pole simple cyst measuring 9 mm. No peri-transplant fluid collection seen. Color flow in the main renal artery:  Yes Color flow in the main renal vein:  Yes Duplex Doppler Evaluation: Main Renal Artery Resistive Index: 0.67 Venous waveform in main renal vein:  Present. Intrarenal resistive index in upper pole:  0.66 (normal 0.6-0.8; equivocal 0.8-0.9; abnormal >= 0.9) Intrarenal resistive index in lower pole: 0.68 (normal 0.6-0.8; equivocal 0.8-0.9; abnormal >= 0.9) Bladder: Normal for degree of bladder distention. Other findings:  Trace fullness of renal pelvis. IMPRESSION: No acute process identified. Unremarkable renal transplant Doppler ultrasound. Electronically Signed   By: Kristine Garbe M.D.   On: 12/22/2017 14:45    EKG:   Orders placed or performed during the hospital encounter of 12/22/17  . ED EKG  . ED EKG  . EKG 12-Lead  . EKG 12-Lead      Management plans discussed with the patient, family and they are in agreement.  CODE STATUS:     Code Status Orders  (From admission, onward)         Start     Ordered   12/22/17 1802  Full code  Continuous     12/22/17 1803        Code Status History    Date Active Date Inactive Code Status Order ID Comments User Context   12/04/2017 1749 12/06/2017 1550 Full Code [id number]  Bettey Costa, MD ED   11/12/2017 1647 11/14/2017 1517 Full Code [id number]  Hooten, Laurice Record, MD Inpatient    Advance Directive Documentation     Most Recent Value  Type of Advance Directive  Healthcare Power of Attorney  Pre-existing out of facility DNR order  (yellow form or pink MOST form)  -  "MOST" Form in Place?  -      TOTAL TIME  TAKING CARE OF THIS PATIENT: 43 minutes.   Note: This dictation was prepared with Dragon dictation along with smaller phrase technology. Any transcriptional errors that result from this process are unintentional.   @[facility] @  on 12/23/2017 at 12:01 PM  Between 7am to 6pm - Pager - [phone]  After 6pm go to [email or web address] - password [email or web address] [facility]  [facility]  Office  [phone]  CC: Primary care physician; Sofie Hartigan, MD

## 2017-12-23 NOTE — Plan of Care (Signed)
  Problem: Health Behavior/Discharge Planning: Goal: Ability to manage health-related needs will improve Outcome: Progressing   Problem: Clinical Measurements: Goal: Ability to maintain clinical measurements within normal limits will improve Outcome: Progressing Goal: Respiratory complications will improve Outcome: Progressing   Problem: Activity: Goal: Risk for activity intolerance will decrease Outcome: Progressing   Problem: Coping: Goal: Level of anxiety will decrease Outcome: Progressing   Problem: Elimination: Goal: Will not experience complications related to bowel motility Outcome: Progressing   Problem: Pain Managment: Goal: General experience of comfort will improve Outcome: Progressing   Problem: Safety: Goal: Ability to remain free from injury will improve Outcome: Progressing   Problem: Skin Integrity: Goal: Risk for impaired skin integrity will decrease Outcome: Progressing

## 2017-12-30 ENCOUNTER — Ambulatory Visit: Payer: BLUE CROSS/BLUE SHIELD | Admitting: Gastroenterology

## 2018-01-01 ENCOUNTER — Encounter: Payer: Self-pay | Admitting: Emergency Medicine

## 2018-01-01 ENCOUNTER — Emergency Department
Admission: EM | Admit: 2018-01-01 | Discharge: 2018-01-01 | Disposition: A | Discharge status: Home / Self Care | Payer: BLUE CROSS/BLUE SHIELD | Attending: Emergency Medicine | Admitting: Emergency Medicine

## 2018-01-01 ENCOUNTER — Other Ambulatory Visit: Payer: Self-pay

## 2018-01-01 DIAGNOSIS — E119 Type 2 diabetes mellitus without complications: Secondary | ICD-10-CM | POA: Insufficient documentation

## 2018-01-01 DIAGNOSIS — R000 Tachycardia, unspecified: Secondary | ICD-10-CM | POA: Insufficient documentation

## 2018-01-01 DIAGNOSIS — F17210 Nicotine dependence, cigarettes, uncomplicated: Secondary | ICD-10-CM | POA: Diagnosis not present

## 2018-01-01 DIAGNOSIS — Z7901 Long term (current) use of anticoagulants: Secondary | ICD-10-CM | POA: Insufficient documentation

## 2018-01-01 DIAGNOSIS — Z85828 Personal history of other malignant neoplasm of skin: Secondary | ICD-10-CM | POA: Insufficient documentation

## 2018-01-01 DIAGNOSIS — I10 Essential (primary) hypertension: Secondary | ICD-10-CM | POA: Diagnosis not present

## 2018-01-01 DIAGNOSIS — Z7982 Long term (current) use of aspirin: Secondary | ICD-10-CM | POA: Diagnosis not present

## 2018-01-01 DIAGNOSIS — Z79899 Other long term (current) drug therapy: Secondary | ICD-10-CM | POA: Insufficient documentation

## 2018-01-01 DIAGNOSIS — Z96652 Presence of left artificial knee joint: Secondary | ICD-10-CM | POA: Diagnosis not present

## 2018-01-01 LAB — CBC WITH DIFFERENTIAL/PLATELET
Abs Immature Granulocytes: 0.02 10*3/uL (ref 0.00–0.07)
Basophils Absolute: 0 10*3/uL (ref 0.0–0.1)
Basophils Relative: 0 %
Eosinophils Absolute: 0.2 10*3/uL (ref 0.0–0.5)
Eosinophils Relative: 2 %
HCT: 43.5 % (ref 39.0–52.0)
HEMOGLOBIN: 13.6 g/dL (ref 13.0–17.0)
Immature Granulocytes: 0 %
Lymphocytes Relative: 21 %
Lymphs Abs: 2 10*3/uL (ref 0.7–4.0)
MCH: 27.8 pg (ref 26.0–34.0)
MCHC: 31.3 g/dL (ref 30.0–36.0)
MCV: 88.8 fL (ref 80.0–100.0)
MONO ABS: 0.7 10*3/uL (ref 0.1–1.0)
MONOS PCT: 7 %
Neutro Abs: 6.4 10*3/uL (ref 1.7–7.7)
Neutrophils Relative %: 70 %
Platelets: 224 10*3/uL (ref 150–400)
RBC: 4.9 MIL/uL (ref 4.22–5.81)
RDW: 15.6 % — ABNORMAL HIGH (ref 11.5–15.5)
WBC: 9.3 10*3/uL (ref 4.0–10.5)
nRBC: 0 % (ref 0.0–0.2)

## 2018-01-01 LAB — COMPREHENSIVE METABOLIC PANEL
ALK PHOS: 72 U/L (ref 38–126)
ALT: 20 U/L (ref 0–44)
AST: 27 U/L (ref 15–41)
Albumin: 4.3 g/dL (ref 3.5–5.0)
Anion gap: 13 (ref 5–15)
BILIRUBIN TOTAL: 0.6 mg/dL (ref 0.3–1.2)
BUN: 20 mg/dL (ref 6–20)
CO2: 17 mmol/L — ABNORMAL LOW (ref 22–32)
Calcium: 9.4 mg/dL (ref 8.9–10.3)
Chloride: 102 mmol/L (ref 98–111)
Creatinine, Ser: 1.69 mg/dL — ABNORMAL HIGH (ref 0.61–1.24)
GFR calc Af Amer: 53 mL/min — ABNORMAL LOW (ref >60)
GFR, EST NON AFRICAN AMERICAN: 45 mL/min — AB (ref >60)
Glucose, Bld: 99 mg/dL (ref 70–99)
Potassium: 3.5 mmol/L (ref 3.5–5.1)
Sodium: 132 mmol/L — ABNORMAL LOW (ref 135–145)
TOTAL PROTEIN: 7.9 g/dL (ref 6.5–8.1)

## 2018-01-01 LAB — TROPONIN I
Troponin I: 0.03 ng/mL (ref <0.03)
Troponin I: <0.03 ng/mL (ref <0.03)

## 2018-01-01 MED ORDER — ACETAMINOPHEN 325 MG PO TABS
650.0000 mg | ORAL_TABLET | Freq: Once | ORAL | Status: AC
  Administered 2018-01-01: 650 mg via ORAL

## 2018-01-01 MED ORDER — SODIUM CHLORIDE 0.9 % IV BOLUS
500.0000 mL | Freq: Once | INTRAVENOUS | Status: AC
  Administered 2018-01-01: 500 mL via INTRAVENOUS

## 2018-01-01 MED ORDER — ACETAMINOPHEN 325 MG PO TABS
ORAL_TABLET | ORAL | Status: AC
  Administered 2018-01-01: 650 mg via ORAL
  Filled 2018-01-01: qty 2

## 2018-01-01 NOTE — ED Triage Notes (Signed)
Pt arrived with significant other with concerns of tachycardia and dizziness. Pt states he checked it at home and it was over 100 all day. Pt denies pain. Pt's HR 124 in triage.

## 2018-01-01 NOTE — Discharge Instructions (Signed)
Follow-up with Dr. Wadie Lessen as soon as possible.  Make sure to continue drinking plenty of fluids to keep yourself well hydrated and prevent strain on your kidneys.  Return to the ER for new, worsening, persistent severe elevated heart rate, palpitations, chest pain, difficulty breathing, or any other new or worsening symptoms that concern you.

## 2018-01-01 NOTE — ED Provider Notes (Signed)
[facility] Emergency Department Provider Note ____________________________________________   First MD Initiated Contact with Patient 01/01/18 1940     (approximate)  I have reviewed the triage vital signs and the nursing notes.   HISTORY  Chief Complaint Tachycardia    HPI Billy Hughes is a 52 y.o. male with PMH as noted below including renal transplant and recent admission for AKI who presents with tachycardia, unknown onset but first noted today, not associated with any specific symptoms.  The patient states that he was getting a routine outpatient blood draw this morning and at that time was noted to have a heart rate that was elevated.  He checked at home on his own blood pressure and heart rate monitor several times and noted a heart rate between 115 and as high as 134.  The patient states that he feels mildly lightheaded when he stands up, but this has been the case for a few months after his blood pressure regimen was altered and is unchanged today.  He denies any chest pain, palpitations, shortness of breath, or other acute symptoms.  He has no new leg pain or swelling.   Past Medical History:  Diagnosis Date  . Anemia   . Anxiety   . Cancer ([place])    skin  . Diabetes mellitus without complication ([place])   . Diverticulitis   . Gastric ulcer   . GERD (gastroesophageal reflux disease)   . Hypertension   . Renal disorder     Patient Active Problem List   Diagnosis Date Noted  . Acute renal failure (ARF) ([place]) 12/22/2017  . Renal injury 12/04/2017  . S/P total knee arthroplasty 11/12/2017  . Primary osteoarthritis of left knee 05/30/2017  . Hypertension 02/28/2017  . Renal disorder 02/28/2017  . DVT (deep venous thrombosis) ([place]) 02/28/2017  . GERD without esophagitis 02/10/2017  . Hyperlipidemia 02/10/2017  . Recurrent major depressive disorder, in partial remission ([place]) 02/10/2017  . Post concussion syndrome 03/05/2016  . Kidney  transplant status 11/05/2011    Past Surgical History:  Procedure Laterality Date  . COLON RESECTION    . COLON SURGERY    . DIALYSIS FISTULA CREATION    . IVC FILTER INSERTION Left 10/27/2017   Procedure: IVC FILTER INSERTION;  Surgeon: Algernon Huxley, MD;  Location: [place] [facility];  Service: Cardiovascular;  Laterality: Left;  . KIDNEY TRANSPLANT    . KNEE ARTHROPLASTY Left 11/12/2017   Procedure: COMPUTER ASSISTED TOTAL KNEE ARTHROPLASTY;  Surgeon: Dereck Leep, MD;  Location: [facility] ORS;  Service: Orthopedics;  Laterality: Left;  . NASAL RECONSTRUCTION      Prior to Admission medications   Medication Sig Start Date End Date Taking? Authorizing Provider  acetaminophen (TYLENOL) 500 MG tablet Take 1,000 mg by mouth every 8 (eight) hours as needed for pain. 12/14/17   [provider]  albuterol (PROVENTIL HFA;VENTOLIN HFA) 108 (90 Base) MCG/ACT inhaler Inhale 1-2 puffs into the lungs every 6 (six) hours as needed for wheezing or shortness of breath.    [provider]  amLODipine (NORVASC) 10 MG tablet Take 10 mg by mouth at bedtime.     [provider]  aspirin EC 81 MG tablet Take 81 mg by mouth at bedtime.    [provider]  atorvastatin (LIPITOR) 20 MG tablet Take 20 mg by mouth at bedtime.    [provider]  Magnesium Oxide 250 MG TABS Take 500 mg by mouth daily.  [provider]  megestrol (MEGACE) 40 MG tablet Take 1 tablet (40 mg total) by mouth daily. 12/24/17   Nicholes Mango, MD  metoprolol succinate (TOPROL-XL) 25 MG 24 hr tablet Take 50 mg by mouth 2 (two) times daily.    [provider]  Multiple Vitamin (MULTIVITAMIN WITH MINERALS) TABS tablet Take 1 tablet by mouth daily. 12/07/17   Fritzi Mandes, MD  mycophenolate (MYFORTIC) 180 MG EC tablet Take 360 mg by mouth 2 (two) times daily.     [provider]  nortriptyline (PAMELOR) 25 MG capsule Take 25 mg by mouth at bedtime.    [provider]  pantoprazole (PROTONIX) 40 MG tablet Take 1 tablet (40 mg total) by mouth 2 (two) times daily. 09/30/17   Earleen Newport, MD  PARoxetine (PAXIL) 20 MG tablet Take 20 mg by mouth at bedtime.     [provider]  rivaroxaban (XARELTO) 20 MG TABS tablet Take 1 tablet (20 mg total) by mouth daily with supper. Patient taking differently: Take 20 mg by mouth at bedtime.  06/02/17   Rudene Re, MD  spironolactone (ALDACTONE) 25 MG tablet Take 25 mg by mouth daily.    [provider]  tacrolimus (PROGRAF) 1 MG capsule Take 1 mg by mouth 2 (two) times daily.     [provider]  traMADol (ULTRAM) 50 MG tablet Take 1-2 tablets (50-100 mg total) by mouth every 4 (four) hours as needed for moderate pain. 11/13/17   Watt Climes, PA    Allergies Patient has no known allergies.  Family History  Problem Relation Age of Onset  . Cancer Mother   . Cancer Father     Social History Social History   Tobacco Use  . Smoking status: Current Every Day Smoker    Packs/day: 1.00    Types: Cigarettes  . Smokeless tobacco: Never Used  Substance Use Topics  . Alcohol use: Not Currently    Comment: social  . Drug use: No    Review of Systems  Constitutional: No fever. Eyes: No redness. ENT: No neck pain. Cardiovascular: Denies chest pain. Respiratory: Denies shortness of breath. Gastrointestinal: No nausea or vomiting.  Genitourinary: Negative for flank pain.  Musculoskeletal: Negative for back pain. Skin: Negative for rash. Neurological: Negative for headache.   ____________________________________________   PHYSICAL EXAM:  VITAL SIGNS: ED Triage Vitals  Enc Vitals Group     BP 01/01/18 1712 137/73     Pulse Rate 01/01/18 1712 (!) 124     Resp 01/01/18 1712 12     Temp 01/01/18 1712 97.7 F (36.5 C)     Temp Source 01/01/18 1712 Oral     SpO2 01/01/18 1712 100 %     Weight 01/01/18 1711 239 lb (108.4 kg)     Height 01/01/18 1711  6' (1.829 m)     Head Circumference --      Peak Flow --      Pain Score 01/01/18 1715 0     Pain Loc --      Pain Edu? --      Excl. in [place]? --     Constitutional: Alert and oriented. Well appearing and in no acute distress. Eyes: Conjunctivae are normal.  Head: Atraumatic. Nose: No congestion/rhinnorhea. Mouth/Throat: Mucous membranes are slightly dry.   Neck: Normal range of motion.  Cardiovascular: Tachycardic, regular rhythm. Grossly normal heart sounds.  Good peripheral circulation. Respiratory: Normal respiratory effort.  No retractions. Lungs CTAB. Gastrointestinal: No  distention.  Musculoskeletal: No lower extremity edema.  No calf or popliteal swelling or tenderness.  Extremities warm and well perfused.  Neurologic:  Normal speech and language. No gross focal neurologic deficits are appreciated.  Skin:  Skin is warm and dry. No rash noted. Psychiatric: Mood and affect are normal. Speech and behavior are normal.  ____________________________________________   LABS (all labs ordered are listed, but only abnormal results are displayed)  Labs Reviewed  CBC WITH DIFFERENTIAL/PLATELET - Abnormal; Notable for the following components:      Result Value   RDW 15.6 (*)    All other components within normal limits  COMPREHENSIVE METABOLIC PANEL - Abnormal; Notable for the following components:   Sodium 132 (*)    CO2 17 (*)    Creatinine, Ser 1.69 (*)    GFR calc non Af Amer 45 (*)    GFR calc Af Amer 53 (*)    All other components within normal limits  TROPONIN I - Abnormal; Notable for the following components:   Troponin I 0.03 (*)    All other components within normal limits  TROPONIN I   ____________________________________________  EKG  ED ECG REPORT I, Arta Silence, the attending physician, personally viewed and interpreted this ECG.  Date: 01/01/2018 EKG Time: 1714 Rate: 124 Rhythm: Sinus tachycardia QRS Axis: normal Intervals: normal ST/T Wave  abnormalities: normal Narrative Interpretation: no evidence of acute ischemia; no significant change when compared to EKG of 12/24/2017  ____________________________________________  RADIOLOGY    ____________________________________________   PROCEDURES  Procedure(s) performed: No  Procedures  Critical Care performed: No ____________________________________________   INITIAL IMPRESSION / ASSESSMENT AND PLAN / ED COURSE  Pertinent labs & imaging results that were available during my care of the patient were reviewed by me and considered in my medical decision making (see chart for details).  52 year old male with PMH as noted above presents with asymptomatic tachycardia, first noted this morning.  He states that the highest reading he saw today was 134.  He denies any chest pain, shortness of breath, palpitations, or other acute symptoms.  He has had mild lightheadedness since his blood pressure regimen was checked and since he had surgery a few months ago, but there has been no acute change today.  I reviewed the past medical records in Epic; the patient was admitted late last month with dehydration and AKI and again earlier this month, discharged on 12/10 for AKI with elevated creatinine.  On exam today, his vital signs are normal except for a heart rate of 120 in triage.  By the time the patient was brought back to a room and I saw him, his heart rate was around 103.  The remainder of the exam is unremarkable except for slightly dry mucous membranes.  Lab work-up obtained from triage is reassuring.  The patient's creatinine is 1.69 which is slightly elevated from a month ago (when it was 1.5), but not as high as it was the 2 times he was admitted, when it was well over 2.  His chemistry is otherwise unremarkable.  His troponin is 0.03, which is likely related to his underlying renal insufficiency.  Overall at this time I suspect most likely continued dehydration as the primary  etiology of his tachycardia.  There is no evidence of arrhythmia or other primary cardiac etiology.  There is also no clinical evidence for ACS in this asymptomatic patient.  Given his DVT history the patient is at elevated risk for PE, however he has an  IVC filter in place, he is on Xarelto, and he has no hypoxia, chest pain, shortness of breath or any other symptoms to suggest DVT.  PE with an IVC filter in place and presenting with isolated tachycardia with no other symptoms to be extraordinarily unlikely.  I discussed the results of the work-up with the patient.  I think it would be reasonable to obtain a repeat troponin to make sure it is not trending upwards.  I am also giving a fluid bolus.  If the heart rate continues to improve after a small fluid bolus and the troponin is on trending upwards, the patient will be stable for discharge with close outpatient follow-up.  The patient would strongly prefer to go home and agrees completely with my assessment and plan.  ----------------------------------------- 8:39 PM on 01/01/2018 -----------------------------------------  Patient is pending repeat troponin.  I am signing him out to the oncoming physician Dr. Cinda Quest.  ____________________________________________   FINAL CLINICAL IMPRESSION(S) / ED DIAGNOSES  Final diagnoses:  Tachycardia      NEW MEDICATIONS STARTED DURING THIS VISIT:  New Prescriptions   No medications on file     Note:  This document was prepared using Dragon voice recognition software and may include unintentional dictation errors.    Arta Silence, MD 01/01/18 2039

## 2018-01-01 NOTE — ED Notes (Addendum)
Pt stated that he had checked his heart rate and it was ranging from 115-130 today. Pt denies any chest pain, SOB, n/v. Pt stated that he has been feeling dizzy as well but that has been ongoing since his knee replacement (October 20). Pt stated that he has been admitted twice since then for dehydration.

## 2018-01-01 NOTE — ED Provider Notes (Signed)
Can troponin is negative.  Patient feels well heart rate is 98 he wants to go home I will let him do so he says he will call his transplant doctor tomorrow he is sure she will see him quickly.  He will come in again if anything else occurs   Nena Polio, MD 01/01/18 2212

## 2018-01-20 MED ORDER — LACTATED RINGERS IV SOLN
100.00 | INTRAVENOUS | Status: DC
Start: ? — End: 2018-01-20

## 2018-01-20 MED ORDER — RIVAROXABAN 20 MG PO TABS
20.00 | ORAL_TABLET | ORAL | Status: DC
Start: 2018-01-21 — End: 2018-01-20

## 2018-01-20 MED ORDER — MAGNESIUM OXIDE 400 MG PO TABS
200.00 | ORAL_TABLET | ORAL | Status: DC
Start: 2018-01-21 — End: 2018-01-20

## 2018-01-20 MED ORDER — PNEUMOCOCCAL VAC POLYVALENT 25 MCG/0.5ML IJ INJ
0.50 | INJECTION | INTRAMUSCULAR | Status: DC
Start: ? — End: 2018-01-20

## 2018-01-20 MED ORDER — METOPROLOL SUCCINATE ER 25 MG PO TB24
12.50 | ORAL_TABLET | ORAL | Status: DC
Start: 2018-01-20 — End: 2018-01-20

## 2018-01-20 MED ORDER — PAROXETINE HCL 20 MG PO TABS
20.00 | ORAL_TABLET | ORAL | Status: DC
Start: 2018-01-21 — End: 2018-01-20

## 2018-01-20 MED ORDER — DICLOFENAC SODIUM 1 % TD GEL
2.00 | TRANSDERMAL | Status: DC
Start: ? — End: 2018-01-20

## 2018-01-20 MED ORDER — PANTOPRAZOLE SODIUM 40 MG PO TBEC
40.00 | DELAYED_RELEASE_TABLET | ORAL | Status: DC
Start: 2018-01-20 — End: 2018-01-20

## 2018-01-20 MED ORDER — NORTRIPTYLINE HCL 10 MG PO CAPS
20.00 | ORAL_CAPSULE | ORAL | Status: DC
Start: 2018-01-20 — End: 2018-01-20

## 2018-01-20 MED ORDER — MYCOPHENOLATE SODIUM 360 MG PO TBEC
360.00 | DELAYED_RELEASE_TABLET | ORAL | Status: DC
Start: 2018-01-20 — End: 2018-01-20

## 2018-01-20 MED ORDER — ASPIRIN 81 MG PO CHEW
81.00 | CHEWABLE_TABLET | ORAL | Status: DC
Start: 2018-01-21 — End: 2018-01-20

## 2018-01-20 MED ORDER — ATORVASTATIN CALCIUM 10 MG PO TABS
10.00 | ORAL_TABLET | ORAL | Status: DC
Start: 2018-01-21 — End: 2018-01-20

## 2018-01-20 MED ORDER — TACROLIMUS 1 MG PO CAPS
1.00 | ORAL_CAPSULE | ORAL | Status: DC
Start: 2018-01-20 — End: 2018-01-20

## 2018-03-21 ENCOUNTER — Other Ambulatory Visit (INDEPENDENT_AMBULATORY_CARE_PROVIDER_SITE_OTHER): Payer: Self-pay | Admitting: Vascular Surgery

## 2018-04-01 NOTE — Telephone Encounter (Signed)
We can do 30 tablet with one refill.  He will need to have an appointment sometime in may to determine if we continue.  He will need a dvt study

## 2018-04-01 NOTE — Telephone Encounter (Signed)
Patient was last seen in October and cancelled his apt in December with [place]. Does not have upcoming appointment.

## 2018-08-07 ENCOUNTER — Other Ambulatory Visit: Payer: Self-pay

## 2018-08-07 ENCOUNTER — Other Ambulatory Visit: Payer: Self-pay | Admitting: Family Medicine

## 2018-08-07 ENCOUNTER — Ambulatory Visit
Admission: RE | Admit: 2018-08-07 | Discharge: 2018-08-07 | Disposition: A | Discharge status: Home / Self Care | Payer: BC Managed Care – PPO | Source: Ambulatory Visit | Attending: Family Medicine | Admitting: Family Medicine

## 2018-08-07 DIAGNOSIS — R1031 Right lower quadrant pain: Secondary | ICD-10-CM

## 2018-08-08 ENCOUNTER — Other Ambulatory Visit (INDEPENDENT_AMBULATORY_CARE_PROVIDER_SITE_OTHER): Payer: Self-pay | Admitting: Nurse Practitioner

## 2018-08-10 NOTE — Telephone Encounter (Signed)
He needs to be seen. He has an IVC filter and he may not need to continue on xarelto.  No refill until seen

## 2018-08-10 NOTE — Telephone Encounter (Signed)
Patient missed last scheduled appointment in December 2019. Last seen May 2019, please advise.

## 2018-12-07 IMAGING — DX DG CHEST 1V PORT
1 series · 1 of 1 positions shown · non-contrast
Comparison: Chest radiographs 09/30/2017 and earlier.

CLINICAL DATA: 52-year-old male status post left total knee
arthroplasty today. Postoperative hypoxia.

EXAM:
PORTABLE CHEST 1 VIEW

[chest ap]
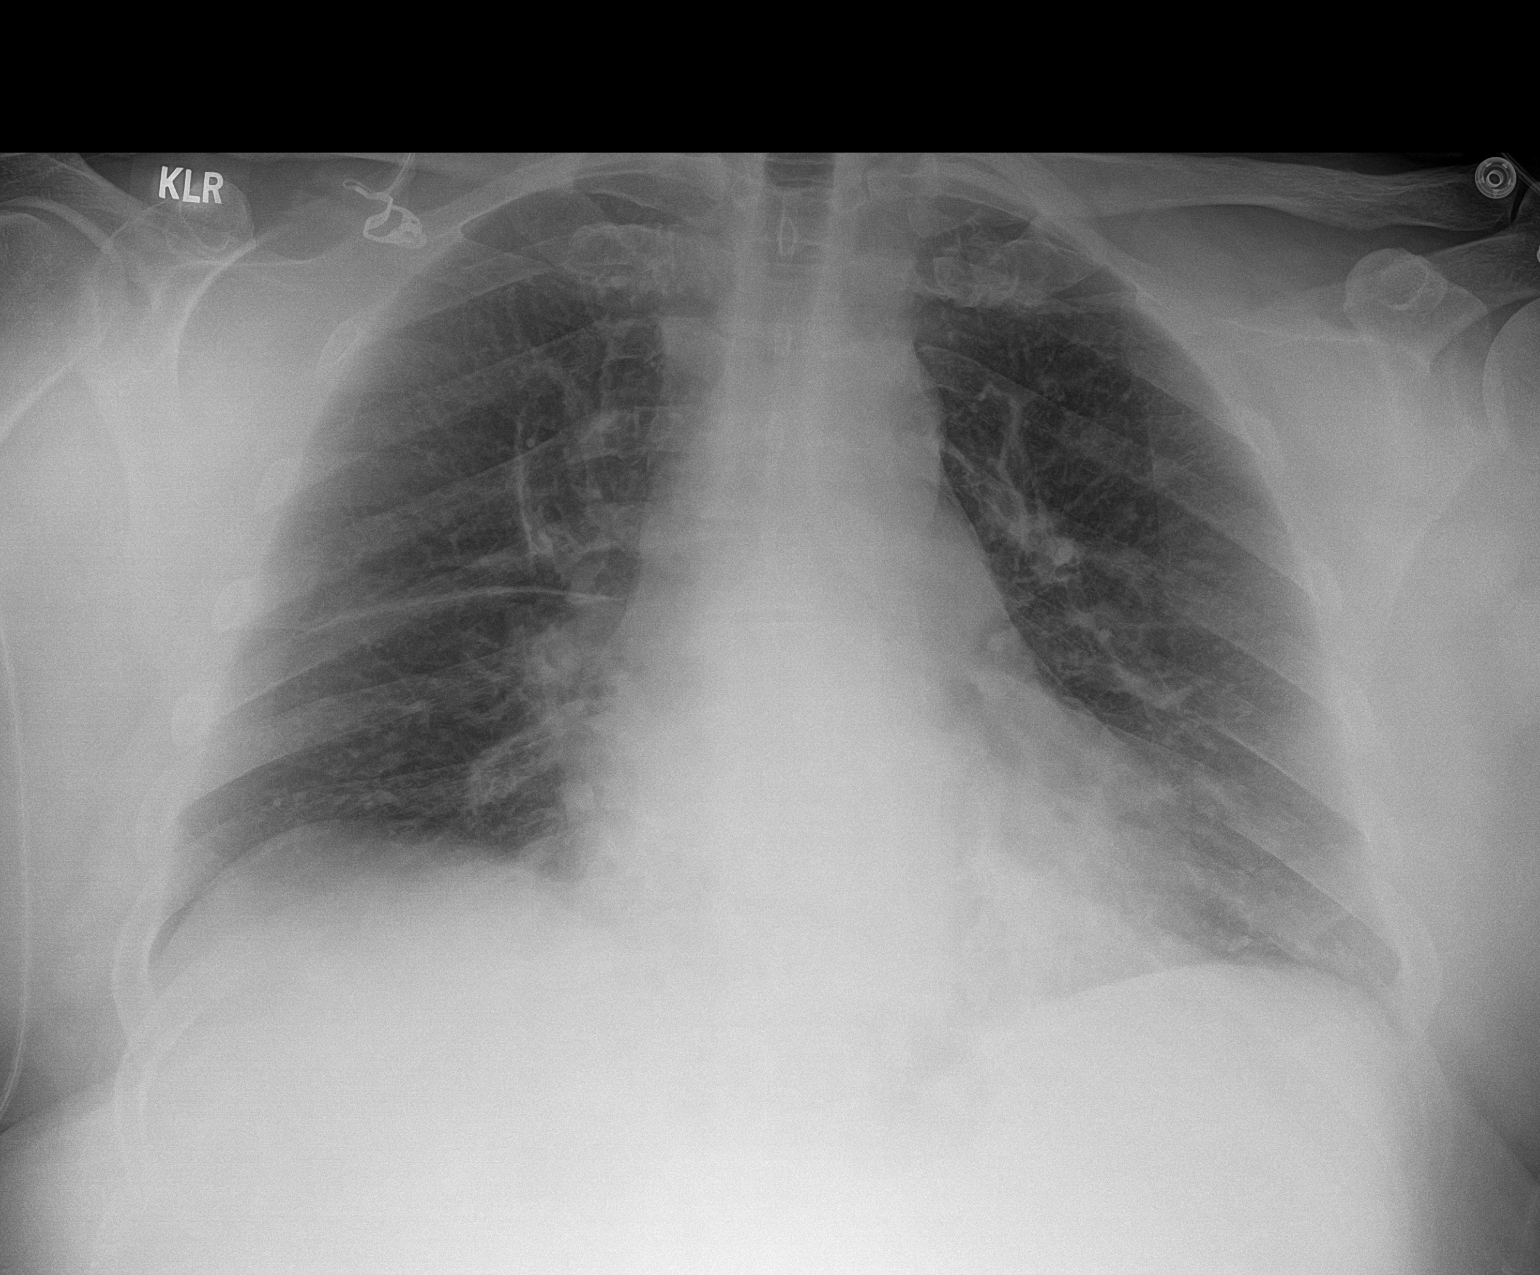

[1 of 1 positions shown; findings below may reference images not displayed]

FINDINGS: Portable AP view at 8464 hours. Stable lung volumes and mediastinal
contours. No pneumothorax, pulmonary edema or pleural effusion. New
streaky retrocardiac opacity at the left lung base. No other
confluent opacity. Visualized tracheal air column is within normal
limits. Paucity bowel gas in the upper abdomen. No acute osseous
abnormality identified.
IMPRESSION: New streaky left lower lobe opacity. Atelectasis favored but
aspiration or pneumonia difficult to exclude in this setting. No
pleural effusion.

## 2018-12-07 IMAGING — DX DG KNEE 1-2V PORT*L*
2 series · 2 of 2 positions shown · non-contrast
Comparison: None.

CLINICAL DATA: Total knee replacement

EXAM:
PORTABLE LEFT KNEE - 1-2 VIEW

[knee ap]
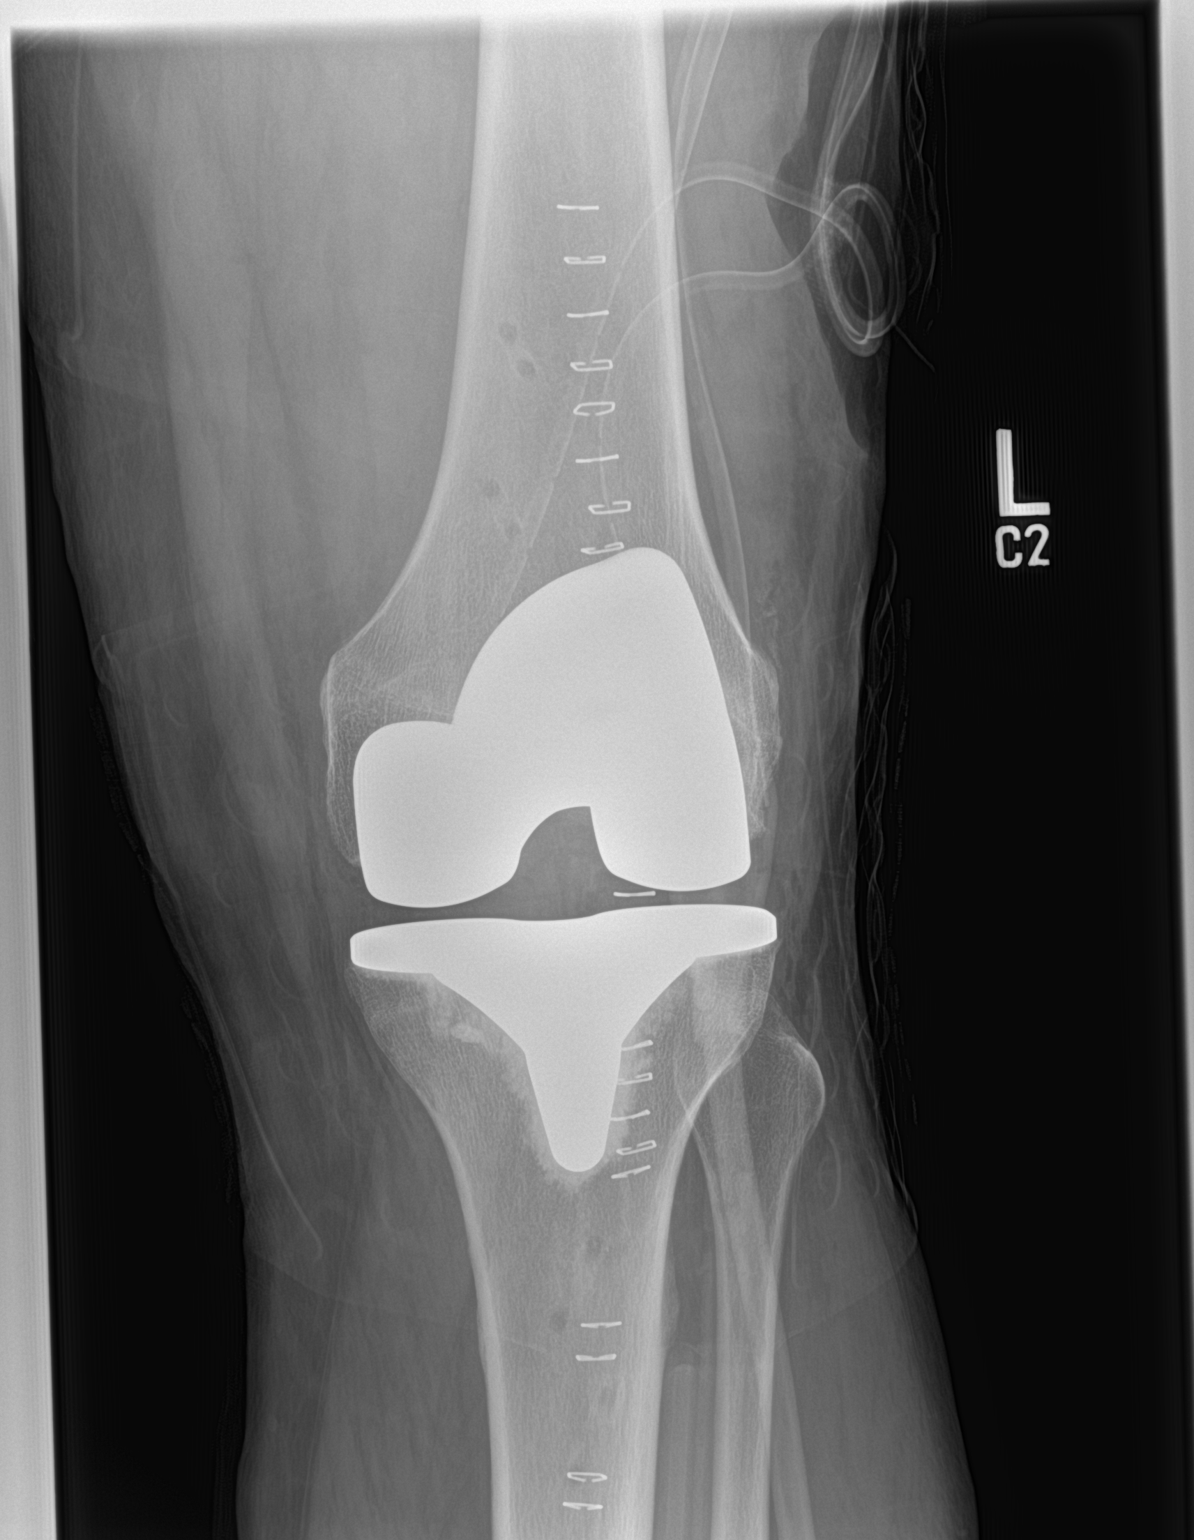

[knee lat]
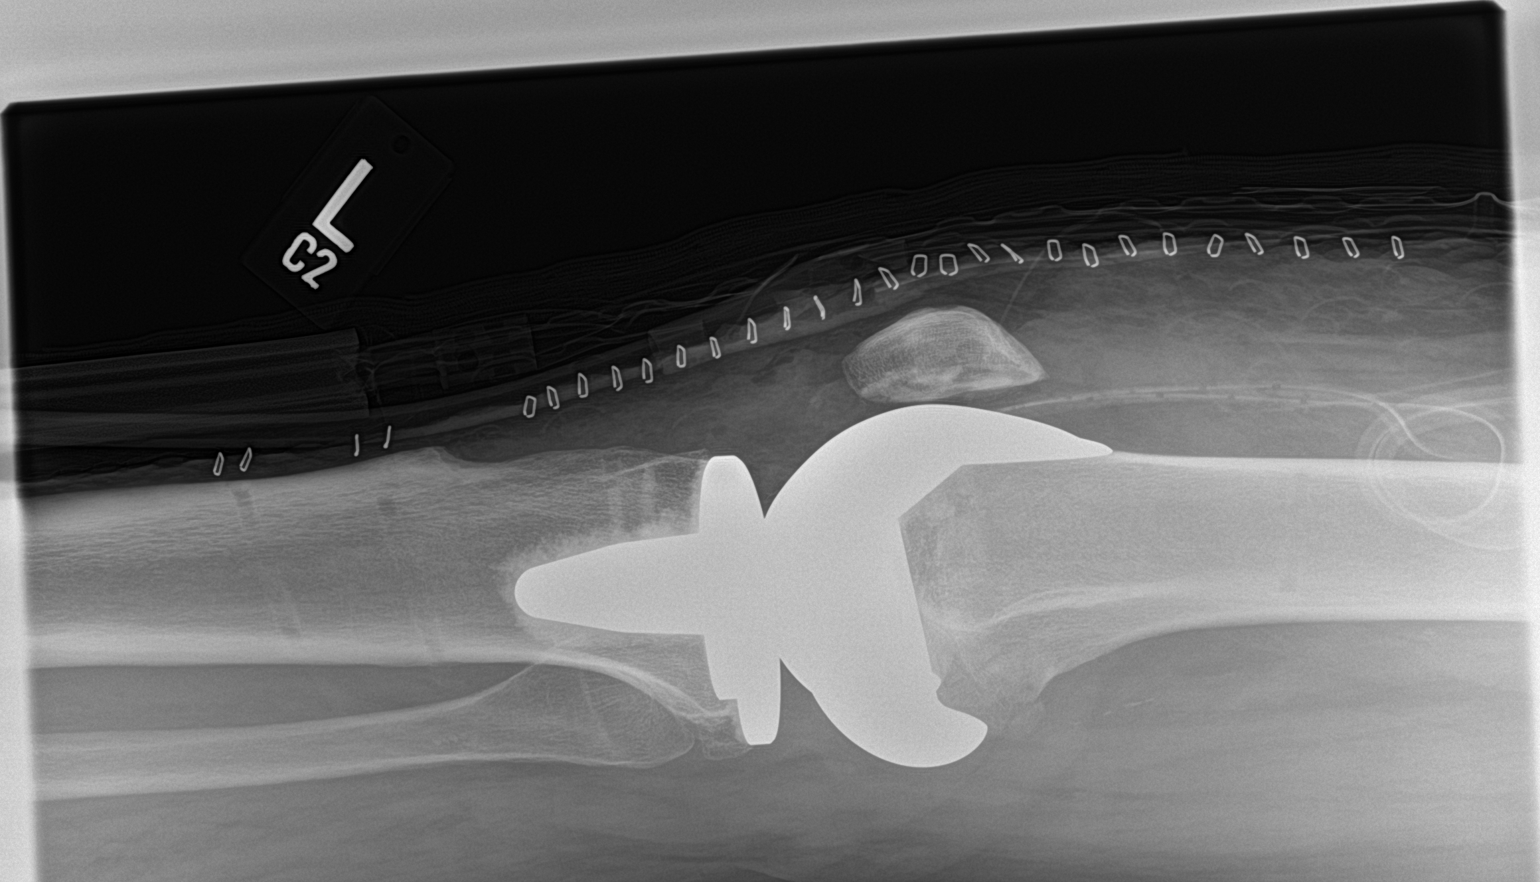

[2 of 2 positions shown; findings below may reference images not displayed]

FINDINGS: Total knee replacement in satisfactory position. No fracture or
immediate complication. Surgical drain in the soft tissues above the
patella.
IMPRESSION: Satisfactory left knee replacement.

## 2018-12-29 IMAGING — US US RENAL
1 series · 14 of 25 positions shown · non-contrast
Comparison: None.

CLINICAL DATA: Transplant kidney.  RIGHT renal pelvic transplant.

EXAM:
RENAL / URINARY TRACT ULTRASOUND COMPLETE

[Series 1: us renal · 14 of 32 slices shown]
[im 1/32]
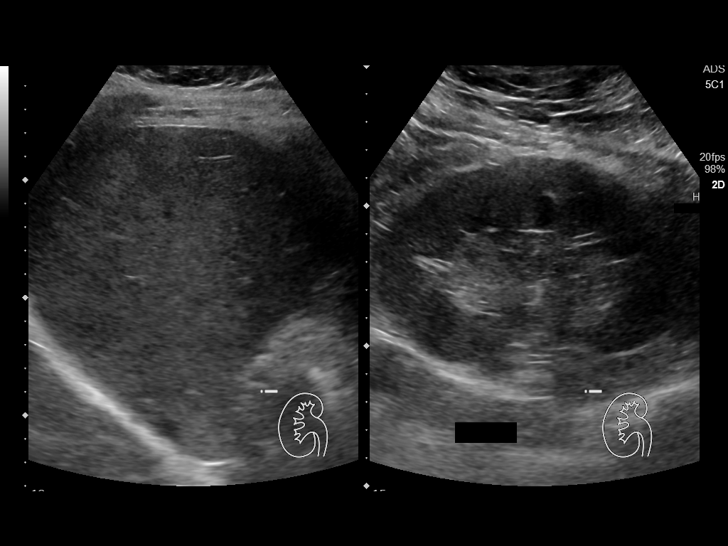
[im 3/32]
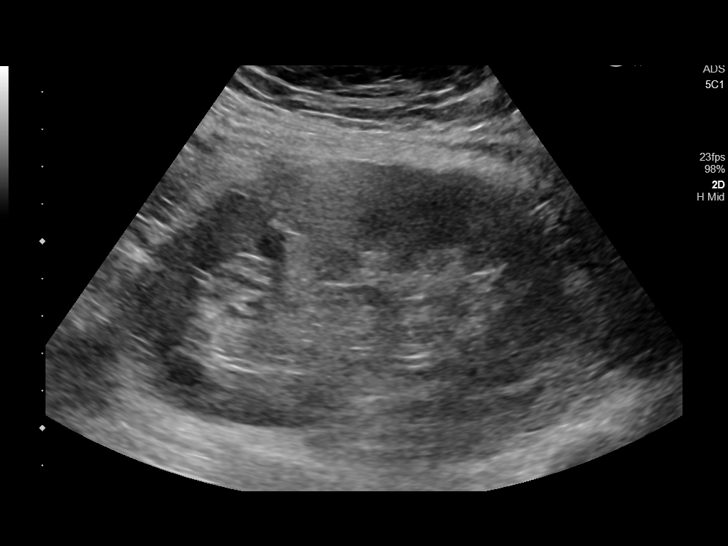
[im 6/32]
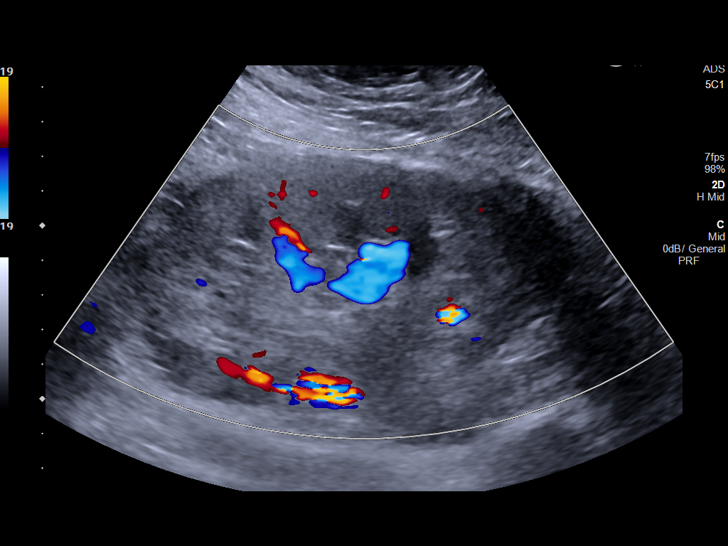
[im 8/32]
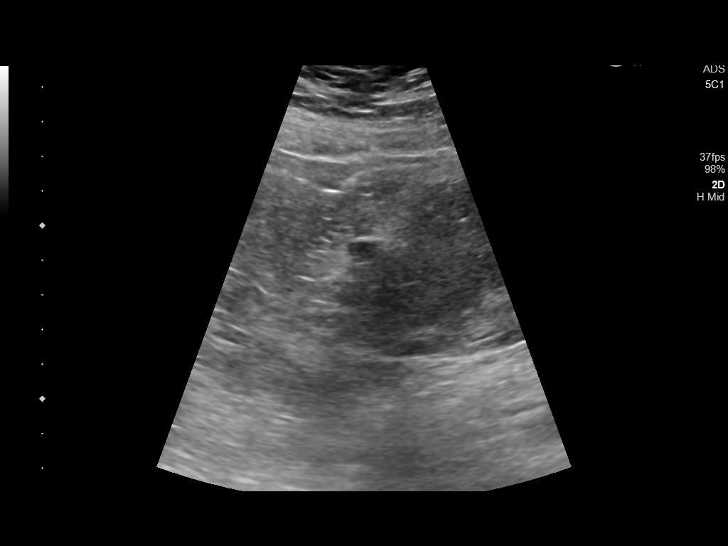
[im 11/32]
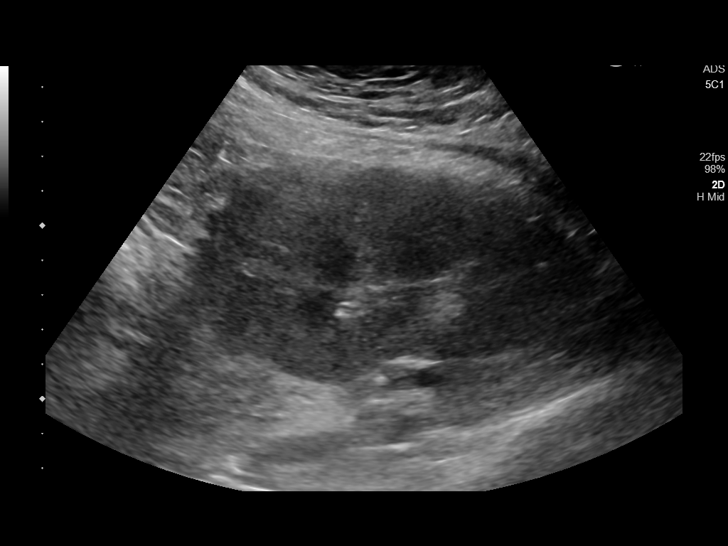
[im 12/32]
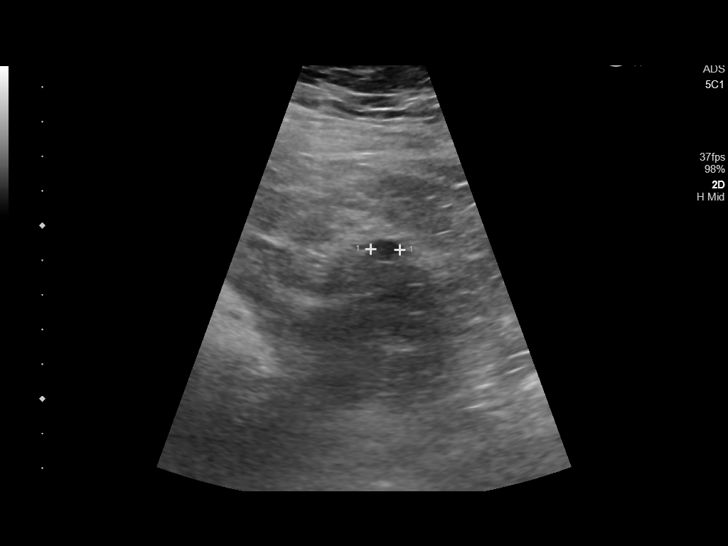
[im 15/32]
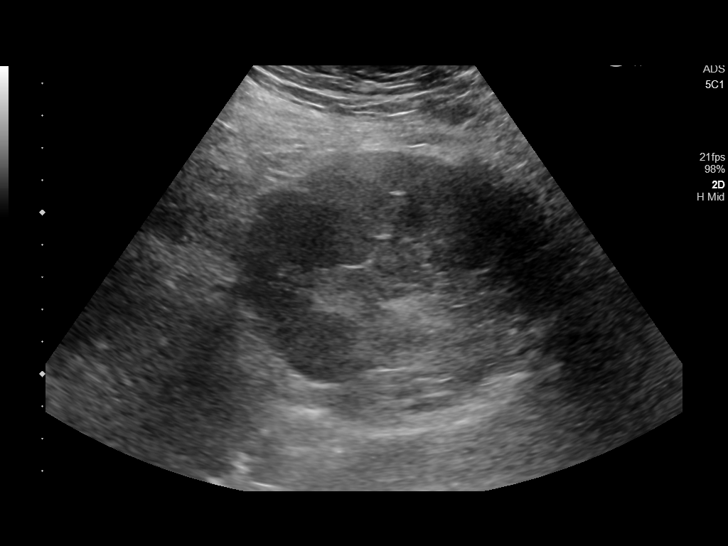
[im 17/32]
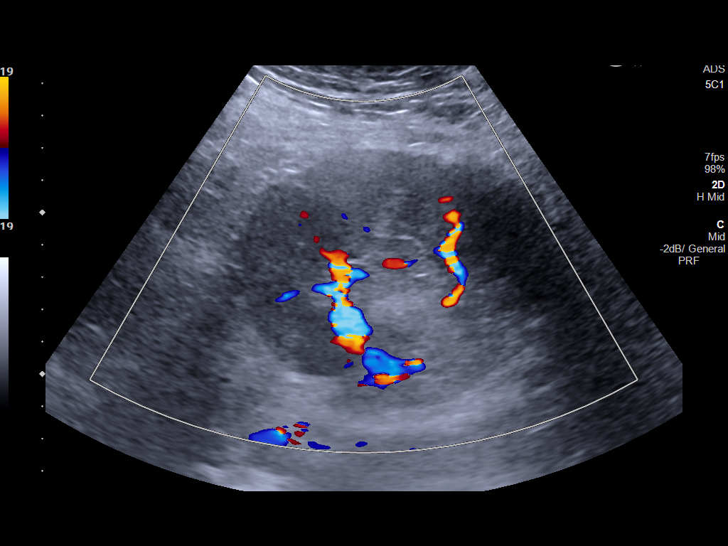
[im 20/32]
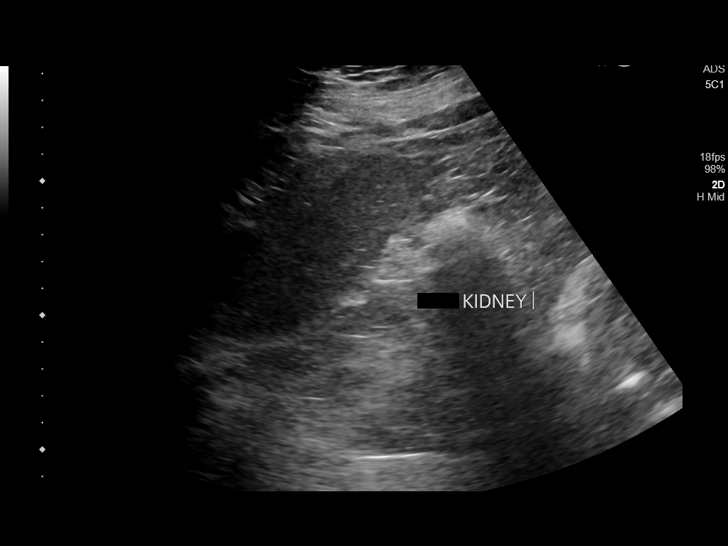
[im 21/32]
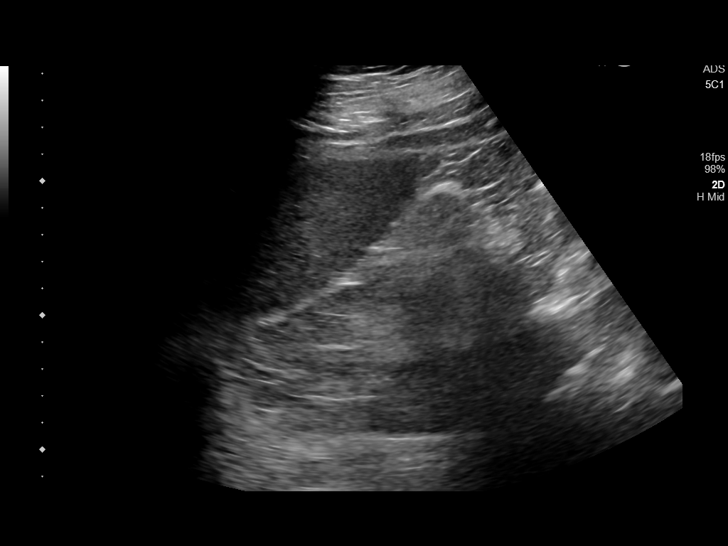
[im 24/32]
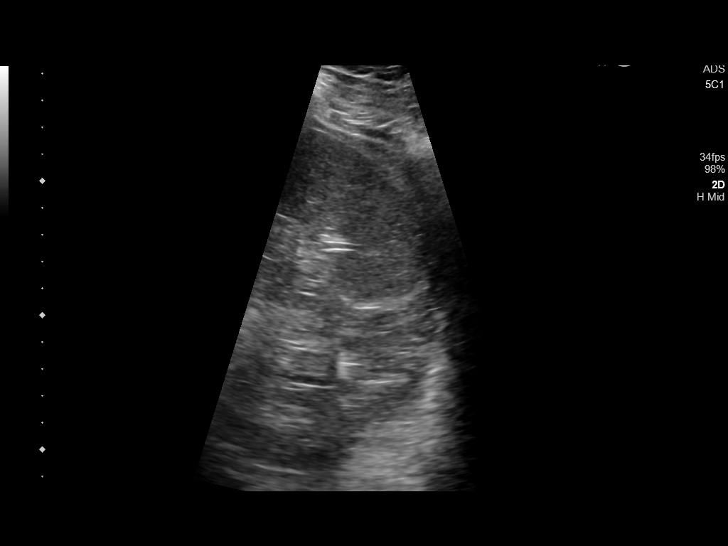
[im 26/32]
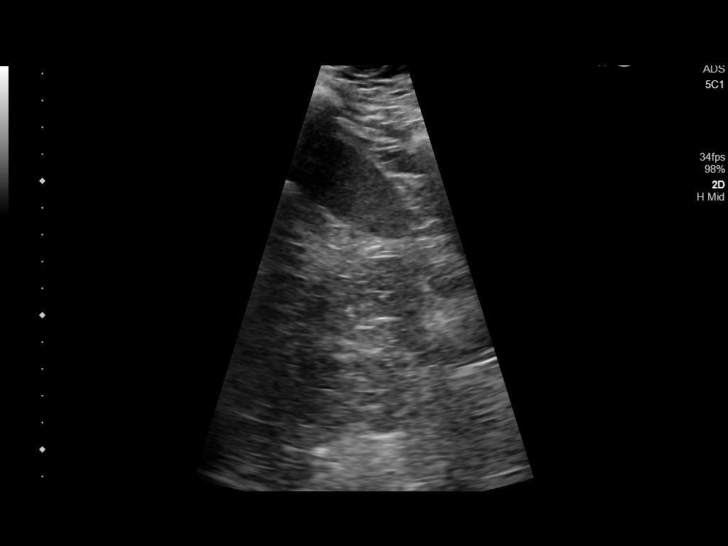
[im 29/32]
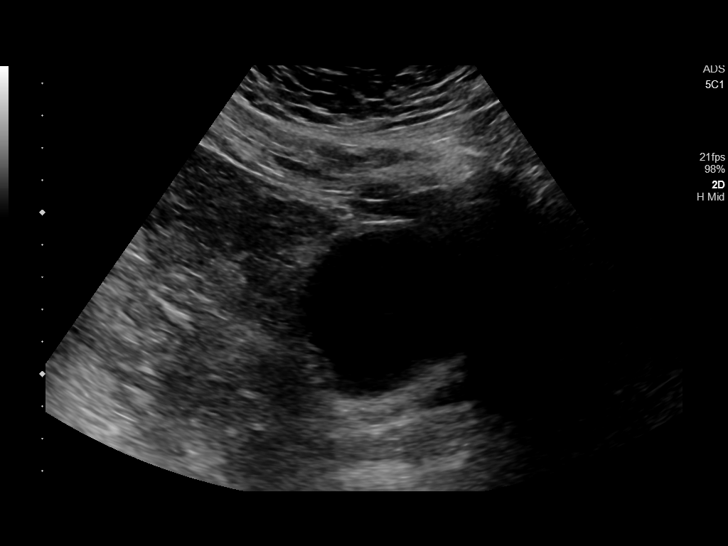
[im 32/32]
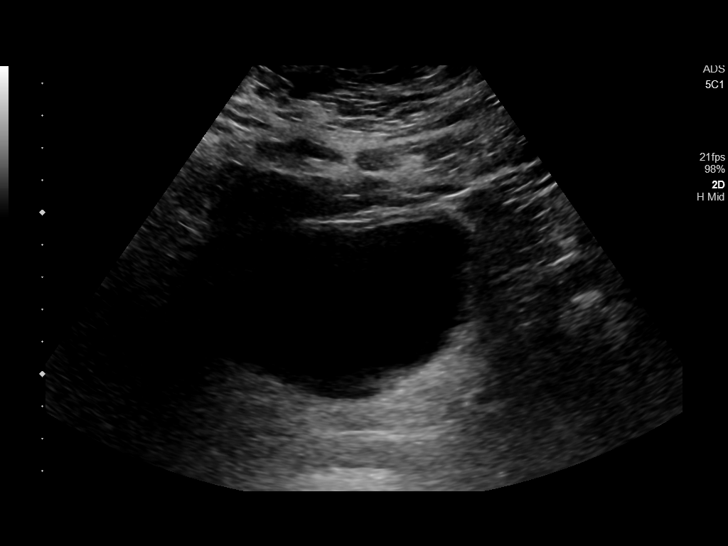

[14 of 25 positions shown; findings below may reference images not displayed]

FINDINGS: Native kidneys difficult to visualize.

Transplant kidney: 11.4 x 8.0 x 6.8 cm. No hydronephrosis. Small
anechoic cysts measuring 1 cm in the mid renal cortex.

Bladder:

Appears normal for degree of bladder distention.
IMPRESSION: 1. No hydronephrosis or obstruction of the transplant RIGHT pelvic
kidney which is normal volume.
2. Atrophic native kidneys difficult to visualize.

## 2019-04-11 ENCOUNTER — Ambulatory Visit: Payer: BC Managed Care – PPO | Attending: Internal Medicine

## 2019-04-11 DIAGNOSIS — Z23 Encounter for immunization: Secondary | ICD-10-CM

## 2019-04-11 NOTE — Progress Notes (Signed)
   Covid-19 Vaccination Clinic  Name:  Billy Hughes    MRN: [id number] DOB: July 19, 1965  04/11/2019  [name] was observed post Covid-19 immunization for 15 minutes without incident. He was provided with Vaccine Information Sheet and instruction to access the V-Safe system.   [name] was instructed to call 911 with any severe reactions post vaccine: Marland Kitchen Difficulty breathing  . Swelling of face and throat  . A fast heartbeat  . A bad rash all over body  . Dizziness and weakness   Immunizations Administered    Name Date Dose VIS Date Route   Pfizer COVID-19 Vaccine 04/11/2019 10:21 AM 0.3 mL 12/25/2018 Intramuscular   Manufacturer: [place]   Lot: [id number]   [place]: [id number]

## 2019-05-04 ENCOUNTER — Ambulatory Visit: Payer: BC Managed Care – PPO | Attending: Internal Medicine

## 2019-05-04 DIAGNOSIS — Z23 Encounter for immunization: Secondary | ICD-10-CM

## 2019-05-04 NOTE — Progress Notes (Signed)
   Covid-19 Vaccination Clinic  Name:  Billy Hughes    MRN: [id number] DOB: 15-Nov-1965  05/04/2019  Mr. Billy Hughes was observed post Covid-19 immunization for 15 minutes without incident. He was provided with Vaccine Information Sheet and instruction to access the V-Safe system.   Mr. Billy Hughes was instructed to call 911 with any severe reactions post vaccine: Marland Kitchen Difficulty breathing  . Swelling of face and throat  . A fast heartbeat  . A bad rash all over body  . Dizziness and weakness   Immunizations Administered    Name Date Dose VIS Date Route   Pfizer COVID-19 Vaccine 05/04/2019  4:18 PM 0.3 mL 03/10/2018 Intramuscular   Manufacturer: [place]   Lot: [id number]   [place]: [id number]

## 2019-06-08 ENCOUNTER — Other Ambulatory Visit: Payer: Self-pay

## 2019-06-08 ENCOUNTER — Emergency Department
Admission: EM | Admit: 2019-06-08 | Discharge: 2019-06-08 | Disposition: A | Discharge status: Home / Self Care | Payer: BC Managed Care – PPO | Attending: Emergency Medicine | Admitting: Emergency Medicine

## 2019-06-08 ENCOUNTER — Encounter: Payer: Self-pay | Admitting: Emergency Medicine

## 2019-06-08 DIAGNOSIS — L7622 Postprocedural hemorrhage and hematoma of skin and subcutaneous tissue following other procedure: Secondary | ICD-10-CM | POA: Diagnosis not present

## 2019-06-08 DIAGNOSIS — M96831 Postprocedural hemorrhage and hematoma of a musculoskeletal structure following other procedure: Secondary | ICD-10-CM

## 2019-06-08 DIAGNOSIS — Z79899 Other long term (current) drug therapy: Secondary | ICD-10-CM | POA: Diagnosis not present

## 2019-06-08 DIAGNOSIS — Z940 Kidney transplant status: Secondary | ICD-10-CM | POA: Insufficient documentation

## 2019-06-08 DIAGNOSIS — Z7901 Long term (current) use of anticoagulants: Secondary | ICD-10-CM | POA: Insufficient documentation

## 2019-06-08 DIAGNOSIS — F17210 Nicotine dependence, cigarettes, uncomplicated: Secondary | ICD-10-CM | POA: Diagnosis not present

## 2019-06-08 DIAGNOSIS — Z7982 Long term (current) use of aspirin: Secondary | ICD-10-CM | POA: Insufficient documentation

## 2019-06-08 DIAGNOSIS — Z96652 Presence of left artificial knee joint: Secondary | ICD-10-CM | POA: Insufficient documentation

## 2019-06-08 DIAGNOSIS — E119 Type 2 diabetes mellitus without complications: Secondary | ICD-10-CM | POA: Diagnosis not present

## 2019-06-08 DIAGNOSIS — Z85828 Personal history of other malignant neoplasm of skin: Secondary | ICD-10-CM | POA: Diagnosis not present

## 2019-06-08 DIAGNOSIS — I10 Essential (primary) hypertension: Secondary | ICD-10-CM | POA: Insufficient documentation

## 2019-06-08 MED ORDER — OXYCODONE-ACETAMINOPHEN 5-325 MG PO TABS
2.0000 | ORAL_TABLET | Freq: Once | ORAL | Status: AC
  Administered 2019-06-08: 2 via ORAL
  Filled 2019-06-08: qty 2

## 2019-06-08 MED ORDER — MICROFIBRILLAR COLL HEMOSTAT EX POWD
5.0000 g | Freq: Once | CUTANEOUS | Status: AC
  Administered 2019-06-08: 5 g via TOPICAL
  Filled 2019-06-08: qty 5

## 2019-06-08 MED ORDER — OXYCODONE-ACETAMINOPHEN 5-325 MG PO TABS: 1.0000 | ORAL_TABLET | ORAL | 0 refills | Status: DC | PRN

## 2019-06-08 MED ORDER — MICROFIBRILLAR COLL HEMOSTAT EX POWD
5.0000 g | Freq: Once | CUTANEOUS | Status: AC
  Administered 2019-06-08: 5 g via TOPICAL

## 2019-06-08 MED ORDER — GELATIN ABSORBABLE 100 CM EX MISC
CUTANEOUS | Status: AC
  Filled 2019-06-08: qty 1

## 2019-06-08 MED ORDER — TRANEXAMIC ACID 1000 MG/10ML IV SOLN
500.0000 mg | Freq: Once | INTRAVENOUS | Status: AC
  Administered 2019-06-08: 500 mg via TOPICAL
  Filled 2019-06-08: qty 10

## 2019-06-08 NOTE — Discharge Instructions (Addendum)
Keep the wound untouched for 48 hours. After that, wash gently with warm running water but no scrubbing. Hold your Xarelto for 48 hours. Contact your surgeon this morning for close follow up. Return to the ER for recurrent bleeding.

## 2019-06-08 NOTE — ED Triage Notes (Signed)
Pt to triage via w/c with no distress noted, mask in place; biopsy performed on Thursday to scalp (Dr Phillip Heal in [facility] ([facility])); awoke tonight with bleeding to site; Pt taking xarelto and aspirin

## 2019-06-08 NOTE — ED Provider Notes (Signed)
[facility] Emergency Department Provider Note  ____________________________________________  Time seen: Approximately 5:41 AM  I have reviewed the triage vital signs and the nursing notes.   HISTORY  Chief Complaint Post-op Problem   HPI Ethridge Fittro is a 54 y.o. male with a history of DVT on Xarelto who presents for evaluation of post op bleeding.  Patient had a biopsy performed 4 days ago of his scalp for concerns of skin cancer.  This evening he woke up with his bed soaked in blood.  Last Xarelto was last night.  He denies syncope, dizziness, chest pain, shortness of breath.  The bleeding has been constant since he woke up in moderate in intensity.   Past Medical History:  Diagnosis Date  . Anemia   . Anxiety   . Cancer ([place])    skin  . Diabetes mellitus without complication ([place])   . Diverticulitis   . Gastric ulcer   . GERD (gastroesophageal reflux disease)   . Hypertension   . Renal disorder     Patient Active Problem List   Diagnosis Date Noted  . Acute renal failure (ARF) ([place]) 12/22/2017  . Renal injury 12/04/2017  . S/P total knee arthroplasty 11/12/2017  . Primary osteoarthritis of left knee 05/30/2017  . Hypertension 02/28/2017  . Renal disorder 02/28/2017  . DVT (deep venous thrombosis) ([place]) 02/28/2017  . GERD without esophagitis 02/10/2017  . Hyperlipidemia 02/10/2017  . Recurrent major depressive disorder, in partial remission ([place]) 02/10/2017  . Post concussion syndrome 03/05/2016  . Kidney transplant status 11/05/2011    Past Surgical History:  Procedure Laterality Date  . COLON RESECTION    . COLON SURGERY    . DIALYSIS FISTULA CREATION    . IVC FILTER INSERTION Left 10/27/2017   Procedure: IVC FILTER INSERTION;  Surgeon: Algernon Huxley, MD;  Location: [place] [facility];  Service: Cardiovascular;  Laterality: Left;  . KIDNEY TRANSPLANT    . KNEE ARTHROPLASTY Left 11/12/2017   Procedure: COMPUTER ASSISTED  TOTAL KNEE ARTHROPLASTY;  Surgeon: Dereck Leep, MD;  Location: [facility] ORS;  Service: Orthopedics;  Laterality: Left;  . NASAL RECONSTRUCTION      Prior to Admission medications   Medication Sig Start Date End Date Taking? Authorizing Provider  acetaminophen (TYLENOL) 500 MG tablet Take 1,000 mg by mouth every 8 (eight) hours as needed for pain. 12/14/17   [provider]  albuterol (PROVENTIL HFA;VENTOLIN HFA) 108 (90 Base) MCG/ACT inhaler Inhale 1-2 puffs into the lungs every 6 (six) hours as needed for wheezing or shortness of breath.    [provider]  amLODipine (NORVASC) 10 MG tablet Take 10 mg by mouth at bedtime.     [provider]  aspirin EC 81 MG tablet Take 81 mg by mouth at bedtime.    [provider]  atorvastatin (LIPITOR) 20 MG tablet Take 20 mg by mouth at bedtime.    [provider]  Magnesium Oxide 250 MG TABS Take 500 mg by mouth daily.     [provider]  megestrol (MEGACE) 40 MG tablet Take 1 tablet (40 mg total) by mouth daily. 12/24/17   Nicholes Mango, MD  metoprolol succinate (TOPROL-XL) 25 MG 24 hr tablet Take 50 mg by mouth 2 (two) times daily.    [provider]  Multiple Vitamin (MULTIVITAMIN WITH MINERALS) TABS tablet Take 1 tablet by mouth daily. 12/07/17   Fritzi Mandes, MD  mycophenolate (MYFORTIC) 180 MG EC tablet Take 360  mg by mouth 2 (two) times daily.     [provider]  nortriptyline (PAMELOR) 25 MG capsule Take 25 mg by mouth at bedtime.    [provider]  oxyCODONE-acetaminophen (PERCOCET) 5-325 MG tablet Take 1 tablet by mouth every 4 (four) hours as needed. 06/08/19   Rudene Re, MD  pantoprazole (PROTONIX) 40 MG tablet Take 1 tablet (40 mg total) by mouth 2 (two) times daily. 09/30/17   Earleen Newport, MD  PARoxetine (PAXIL) 20 MG tablet Take 20 mg by mouth at bedtime.     [provider]  spironolactone (ALDACTONE) 25 MG tablet Take 25 mg by mouth  daily.    [provider]  tacrolimus (PROGRAF) 1 MG capsule Take 1 mg by mouth 2 (two) times daily.     [provider]  traMADol (ULTRAM) 50 MG tablet Take 1-2 tablets (50-100 mg total) by mouth every 4 (four) hours as needed for moderate pain. 11/13/17   Watt Climes, PA  XARELTO 20 MG TABS tablet TAKE 1 TABLET BY MOUTH DAILY 04/01/18   Kris Hartmann, NP    Allergies Patient has no known allergies.  Family History  Problem Relation Age of Onset  . Cancer Mother   . Cancer Father     Social History Social History   Tobacco Use  . Smoking status: Current Every Day Smoker    Packs/day: 1.00    Types: Cigarettes  . Smokeless tobacco: Never Used  Substance Use Topics  . Alcohol use: Not Currently    Comment: social  . Drug use: No    Review of Systems  Constitutional: Negative for fever. Eyes: Negative for visual changes. ENT: Negative for sore throat. Neck: No neck pain  Cardiovascular: Negative for chest pain. Respiratory: Negative for shortness of breath. Gastrointestinal: Negative for abdominal pain, vomiting or diarrhea. Genitourinary: Negative for dysuria. Musculoskeletal: Negative for back pain. Skin: Negative for rash. + bleeding surgical site Neurological: Negative for headaches, weakness or numbness. Psych: No SI or HI  ____________________________________________   PHYSICAL EXAM:  VITAL SIGNS: ED Triage Vitals  Enc Vitals Group     BP 06/08/19 0307 (!) 150/72     Pulse Rate 06/08/19 0307 70     Resp 06/08/19 0307 18     Temp 06/08/19 0307 97.8 F (36.6 C)     Temp Source 06/08/19 0307 Oral     SpO2 06/08/19 0307 97 %     Weight 06/08/19 0306 255 lb (115.7 kg)     Height 06/08/19 0306 6' (1.829 m)     Head Circumference --      Peak Flow --      Pain Score 06/08/19 0306 0     Pain Loc --      Pain Edu? --      Excl. in [place]? --     Constitutional: Alert and oriented. Well appearing and in no apparent distress. HEENT:       Head: Normocephalic and atraumatic.  There is a small arteriole bleeding from the surgical site       Eyes: Conjunctivae are normal. Sclera is non-icteric.       Mouth/Throat: Mucous membranes are moist.       Neck: Supple with no signs of meningismus. Cardiovascular: Regular rate and rhythm. Respiratory: Normal respiratory effort.  Musculoskeletal: No edema, cyanosis, or erythema of extremities. Neurologic: Normal speech and language. Face is symmetric. Moving all extremities. No gross focal neurologic deficits are appreciated. Skin:  Skin is warm, dry and intact. No rash noted. Psychiatric: Mood and affect are normal. Speech and behavior are normal.  ____________________________________________   LABS (all labs ordered are listed, but only abnormal results are displayed)  Labs Reviewed - No data to display ____________________________________________  EKG  none  ____________________________________________  RADIOLOGY  none  ____________________________________________   PROCEDURES  Procedure(s) performed:yes Procedures   Small arteriole bleeding from surgical site.  Several different attempts were made to stop the bleeding including Avitene, Surgicel, quick clot, electric cautery, topical TXA, and several prolonged episodes of 20+ minutes of constant pressure.  Eventually bleeding resolved.  Patient tolerated with no difficulties.  Patient remained hemodynamically stable throughout the whole procedure.  Critical Care performed:  None ____________________________________________   INITIAL IMPRESSION / ASSESSMENT AND PLAN / ED COURSE   54 y.o. male with a history of DVT on Xarelto who presents for evaluation of post op bleeding from a scalp biopsy.  Bleeding was controlled after several attempts per procedure note above.  Patient remained hemodynamically stable throughout the entire time.  Recommended not touching the surgical site for at least 48 hours to allow for full  healing and recurrence of the bleeding.  Recommend holding Xarelto for 2 days.  Recommended follow-up with the surgeon in 24 hours.  Discussed my standard return precautions.  History gathered from patient and his wife was at bedside.  Old medical records reviewed.      _____________________________________________ Please note:  Patient was evaluated in Emergency Department today for the symptoms described in the history of present illness. Patient was evaluated in the context of the global COVID-19 pandemic, which necessitated consideration that the patient might be at risk for infection with the SARS-CoV-2 virus that causes COVID-19. Institutional protocols and algorithms that pertain to the evaluation of patients at risk for COVID-19 are in a state of rapid change based on information released by regulatory bodies including the CDC and federal and state organizations. These policies and algorithms were followed during the patient's care in the ED.  Some ED evaluations and interventions may be delayed as a result of limited staffing during the pandemic.   [place] Controlled Substance Database was reviewed by me. ____________________________________________   FINAL CLINICAL IMPRESSION(S) / ED DIAGNOSES   Final diagnoses:  Postoperative hemorrhage of musculoskeletal structure following non-musculoskeletal procedure      NEW MEDICATIONS STARTED DURING THIS VISIT:  ED Discharge Orders         Ordered    oxyCODONE-acetaminophen (PERCOCET) 5-325 MG tablet  Every 4 hours PRN     06/08/19 0555           Note:  This document was prepared using Dragon voice recognition software and may include unintentional dictation errors.    Alfred Levins, [place], MD 06/08/19 [id number]

## 2019-08-06 ENCOUNTER — Encounter: Payer: Self-pay | Admitting: Emergency Medicine

## 2019-08-06 ENCOUNTER — Emergency Department: Payer: BC Managed Care – PPO

## 2019-08-06 ENCOUNTER — Inpatient Hospital Stay
Admission: EM | Admit: 2019-08-06 | Discharge: 2019-08-07 | DRG: 439 | Discharge status: Against Medical Advice | Payer: BC Managed Care – PPO | Attending: Family Medicine | Admitting: Family Medicine

## 2019-08-06 ENCOUNTER — Other Ambulatory Visit: Payer: Self-pay

## 2019-08-06 DIAGNOSIS — Z9049 Acquired absence of other specified parts of digestive tract: Secondary | ICD-10-CM | POA: Diagnosis not present

## 2019-08-06 DIAGNOSIS — Z7982 Long term (current) use of aspirin: Secondary | ICD-10-CM

## 2019-08-06 DIAGNOSIS — E785 Hyperlipidemia, unspecified: Secondary | ICD-10-CM

## 2019-08-06 DIAGNOSIS — Y830 Surgical operation with transplant of whole organ as the cause of abnormal reaction of the patient, or of later complication, without mention of misadventure at the time of the procedure: Secondary | ICD-10-CM | POA: Diagnosis present

## 2019-08-06 DIAGNOSIS — T8619 Other complication of kidney transplant: Secondary | ICD-10-CM | POA: Diagnosis present

## 2019-08-06 DIAGNOSIS — Z7901 Long term (current) use of anticoagulants: Secondary | ICD-10-CM

## 2019-08-06 DIAGNOSIS — Z96652 Presence of left artificial knee joint: Secondary | ICD-10-CM | POA: Diagnosis present

## 2019-08-06 DIAGNOSIS — Z95828 Presence of other vascular implants and grafts: Secondary | ICD-10-CM | POA: Diagnosis not present

## 2019-08-06 DIAGNOSIS — N189 Chronic kidney disease, unspecified: Secondary | ICD-10-CM | POA: Diagnosis not present

## 2019-08-06 DIAGNOSIS — Z79818 Long term (current) use of other agents affecting estrogen receptors and estrogen levels: Secondary | ICD-10-CM | POA: Diagnosis not present

## 2019-08-06 DIAGNOSIS — F419 Anxiety disorder, unspecified: Secondary | ICD-10-CM | POA: Diagnosis present

## 2019-08-06 DIAGNOSIS — Z85828 Personal history of other malignant neoplasm of skin: Secondary | ICD-10-CM

## 2019-08-06 DIAGNOSIS — Z940 Kidney transplant status: Secondary | ICD-10-CM | POA: Diagnosis not present

## 2019-08-06 DIAGNOSIS — F17210 Nicotine dependence, cigarettes, uncomplicated: Secondary | ICD-10-CM | POA: Diagnosis present

## 2019-08-06 DIAGNOSIS — N179 Acute kidney failure, unspecified: Secondary | ICD-10-CM | POA: Diagnosis present

## 2019-08-06 DIAGNOSIS — Z20822 Contact with and (suspected) exposure to covid-19: Secondary | ICD-10-CM | POA: Diagnosis present

## 2019-08-06 DIAGNOSIS — Z79891 Long term (current) use of opiate analgesic: Secondary | ICD-10-CM | POA: Diagnosis not present

## 2019-08-06 DIAGNOSIS — Z79899 Other long term (current) drug therapy: Secondary | ICD-10-CM | POA: Diagnosis not present

## 2019-08-06 DIAGNOSIS — Z86718 Personal history of other venous thrombosis and embolism: Secondary | ICD-10-CM

## 2019-08-06 DIAGNOSIS — K219 Gastro-esophageal reflux disease without esophagitis: Secondary | ICD-10-CM | POA: Diagnosis present

## 2019-08-06 DIAGNOSIS — I10 Essential (primary) hypertension: Secondary | ICD-10-CM | POA: Diagnosis present

## 2019-08-06 DIAGNOSIS — K8500 Idiopathic acute pancreatitis without necrosis or infection: Secondary | ICD-10-CM | POA: Diagnosis not present

## 2019-08-06 DIAGNOSIS — K859 Acute pancreatitis without necrosis or infection, unspecified: Secondary | ICD-10-CM | POA: Diagnosis present

## 2019-08-06 DIAGNOSIS — I82409 Acute embolism and thrombosis of unspecified deep veins of unspecified lower extremity: Secondary | ICD-10-CM | POA: Diagnosis present

## 2019-08-06 LAB — URINALYSIS, COMPLETE (UACMP) WITH MICROSCOPIC
Bacteria, UA: NONE SEEN
Bilirubin Urine: NEGATIVE
Glucose, UA: NEGATIVE mg/dL
Ketones, ur: NEGATIVE mg/dL
Leukocytes,Ua: NEGATIVE
Nitrite: NEGATIVE
Protein, ur: 100 mg/dL — AB
Specific Gravity, Urine: 1.018 (ref 1.005–1.030)
Squamous Epithelial / LPF: NONE SEEN (ref 0–5)
pH: 5 (ref 5.0–8.0)

## 2019-08-06 LAB — COMPREHENSIVE METABOLIC PANEL
ALT: 17 U/L (ref 0–44)
AST: 19 U/L (ref 15–41)
Albumin: 4.2 g/dL (ref 3.5–5.0)
Alkaline Phosphatase: 74 U/L (ref 38–126)
Anion gap: 9 (ref 5–15)
BUN: 22 mg/dL — ABNORMAL HIGH (ref 6–20)
CO2: 26 mmol/L (ref 22–32)
Calcium: 9.8 mg/dL (ref 8.9–10.3)
Chloride: 96 mmol/L — ABNORMAL LOW (ref 98–111)
Creatinine, Ser: 1.71 mg/dL — ABNORMAL HIGH (ref 0.61–1.24)
GFR calc Af Amer: 51 mL/min — ABNORMAL LOW (ref >60)
GFR calc non Af Amer: 44 mL/min — ABNORMAL LOW (ref >60)
Glucose, Bld: 114 mg/dL — ABNORMAL HIGH (ref 70–99)
Potassium: 4.3 mmol/L (ref 3.5–5.1)
Sodium: 131 mmol/L — ABNORMAL LOW (ref 135–145)
Total Bilirubin: 1 mg/dL (ref 0.3–1.2)
Total Protein: 8.5 g/dL — ABNORMAL HIGH (ref 6.5–8.1)

## 2019-08-06 LAB — CBC
HCT: 49.7 % (ref 39.0–52.0)
Hemoglobin: 16.9 g/dL (ref 13.0–17.0)
MCH: 29.3 pg (ref 26.0–34.0)
MCHC: 34 g/dL (ref 30.0–36.0)
MCV: 86.1 fL (ref 80.0–100.0)
Platelets: 174 10*3/uL (ref 150–400)
RBC: 5.77 MIL/uL (ref 4.22–5.81)
RDW: 14.8 % (ref 11.5–15.5)
WBC: 12.1 10*3/uL — ABNORMAL HIGH (ref 4.0–10.5)
nRBC: 0 % (ref 0.0–0.2)

## 2019-08-06 LAB — TROPONIN I (HIGH SENSITIVITY)
Troponin I (High Sensitivity): 16 ng/L (ref <18)
Troponin I (High Sensitivity): 14 ng/L (ref <18)

## 2019-08-06 LAB — LIPASE, BLOOD: Lipase: 186 U/L — ABNORMAL HIGH (ref 11–51)

## 2019-08-06 MED ORDER — AMLODIPINE BESYLATE 5 MG PO TABS: 10.0000 mg | ORAL_TABLET | Freq: Every day | ORAL | Status: DC

## 2019-08-06 MED ORDER — OXYCODONE HCL 5 MG PO TABS
5.0000 mg | ORAL_TABLET | ORAL | Status: DC | PRN
  Administered 2019-08-07: 5 mg via ORAL
  Filled 2019-08-06: qty 1

## 2019-08-06 MED ORDER — MEGESTROL ACETATE 20 MG PO TABS: 40.0000 mg | ORAL_TABLET | Freq: Every day | ORAL | Status: DC

## 2019-08-06 MED ORDER — PAROXETINE HCL 20 MG PO TABS
20.0000 mg | ORAL_TABLET | Freq: Every day | ORAL | Status: DC
  Filled 2019-08-06: qty 1

## 2019-08-06 MED ORDER — MYCOPHENOLATE SODIUM 180 MG PO TBEC
360.0000 mg | DELAYED_RELEASE_TABLET | Freq: Two times a day (BID) | ORAL | Status: DC
  Administered 2019-08-07: 360 mg via ORAL
  Filled 2019-08-06 (×3): qty 2

## 2019-08-06 MED ORDER — ONDANSETRON HCL 4 MG PO TABS: 4.0000 mg | ORAL_TABLET | Freq: Four times a day (QID) | ORAL | Status: DC | PRN

## 2019-08-06 MED ORDER — ASPIRIN EC 81 MG PO TBEC: 81.0000 mg | DELAYED_RELEASE_TABLET | Freq: Every day | ORAL | Status: DC

## 2019-08-06 MED ORDER — ACETAMINOPHEN 325 MG PO TABS: 650.0000 mg | ORAL_TABLET | Freq: Four times a day (QID) | ORAL | Status: DC | PRN

## 2019-08-06 MED ORDER — HYDROMORPHONE HCL 1 MG/ML IJ SOLN
1.0000 mg | Freq: Once | INTRAMUSCULAR | Status: AC
  Administered 2019-08-06: 1 mg via INTRAVENOUS
  Filled 2019-08-06: qty 1

## 2019-08-06 MED ORDER — RIVAROXABAN 20 MG PO TABS: 20.0000 mg | ORAL_TABLET | Freq: Every day | ORAL | Status: DC

## 2019-08-06 MED ORDER — TACROLIMUS 1 MG PO CAPS
1.0000 mg | ORAL_CAPSULE | Freq: Two times a day (BID) | ORAL | Status: DC
  Administered 2019-08-07: 1 mg via ORAL
  Filled 2019-08-06 (×3): qty 1

## 2019-08-06 MED ORDER — HYDROMORPHONE HCL 1 MG/ML IJ SOLN: 0.5000 mg | INTRAMUSCULAR | Status: DC | PRN

## 2019-08-06 MED ORDER — SODIUM CHLORIDE 0.9 % IV SOLN: INTRAVENOUS | Status: DC

## 2019-08-06 MED ORDER — METOPROLOL SUCCINATE ER 50 MG PO TB24: 50.0000 mg | ORAL_TABLET | Freq: Two times a day (BID) | ORAL | Status: DC

## 2019-08-06 MED ORDER — HYDROMORPHONE HCL 1 MG/ML IJ SOLN
1.0000 mg | INTRAMUSCULAR | Status: DC | PRN
  Administered 2019-08-06 – 2019-08-07 (×4): 1 mg via INTRAVENOUS
  Filled 2019-08-06 (×3): qty 1

## 2019-08-06 MED ORDER — NORTRIPTYLINE HCL 25 MG PO CAPS
25.0000 mg | ORAL_CAPSULE | Freq: Every day | ORAL | Status: DC
  Filled 2019-08-06: qty 1

## 2019-08-06 MED ORDER — ALBUTEROL SULFATE (2.5 MG/3ML) 0.083% IN NEBU: 2.5000 mg | INHALATION_SOLUTION | RESPIRATORY_TRACT | Status: DC | PRN

## 2019-08-06 MED ORDER — TRAZODONE HCL 50 MG PO TABS: 25.0000 mg | ORAL_TABLET | Freq: Every evening | ORAL | Status: DC | PRN

## 2019-08-06 MED ORDER — ONDANSETRON HCL 4 MG/2ML IJ SOLN
4.0000 mg | Freq: Four times a day (QID) | INTRAMUSCULAR | Status: DC | PRN
  Administered 2019-08-07: 4 mg via INTRAVENOUS
  Filled 2019-08-06: qty 2

## 2019-08-06 MED ORDER — SODIUM CHLORIDE 0.9 % IV BOLUS
1000.0000 mL | Freq: Once | INTRAVENOUS | Status: AC
  Administered 2019-08-06: 1000 mL via INTRAVENOUS

## 2019-08-06 MED ORDER — ACETAMINOPHEN 650 MG RE SUPP: 650.0000 mg | Freq: Four times a day (QID) | RECTAL | Status: DC | PRN

## 2019-08-06 MED ORDER — HYDRALAZINE HCL 20 MG/ML IJ SOLN: 10.0000 mg | INTRAMUSCULAR | Status: DC | PRN

## 2019-08-06 MED ORDER — ONDANSETRON HCL 4 MG/2ML IJ SOLN
4.0000 mg | Freq: Once | INTRAMUSCULAR | Status: AC
  Administered 2019-08-06: 4 mg via INTRAVENOUS
  Filled 2019-08-06: qty 2

## 2019-08-06 MED ORDER — PANTOPRAZOLE SODIUM 40 MG PO TBEC: 40.0000 mg | DELAYED_RELEASE_TABLET | Freq: Two times a day (BID) | ORAL | Status: DC

## 2019-08-06 MED ORDER — FENTANYL CITRATE (PF) 100 MCG/2ML IJ SOLN
50.0000 ug | INTRAMUSCULAR | Status: AC | PRN
  Administered 2019-08-06 (×2): 50 ug via INTRAVENOUS
  Filled 2019-08-06 (×2): qty 2

## 2019-08-06 NOTE — ED Notes (Signed)
Pt c/o LUQ pain since Wed, worse today a nd worse with palpation, pt denies N/V/D last BM this am and normal  Pt denies pain relief   Pt reports immunosuppressants for kidney transpant

## 2019-08-06 NOTE — ED Notes (Signed)
EDP at bedside, POC to admit pancreatitis

## 2019-08-06 NOTE — ED Notes (Signed)
Pt tearful in pain. Pt updated on status.

## 2019-08-06 NOTE — ED Triage Notes (Signed)
Pt here for left mid abdominal pain. Pt tearful in triage, appears in lot pain.  Pain X 2 days without relief. No hx of kidney stones.  Has had kidney transplant; received wifes kidney, both his kidneys remain. No known fever. No vomiting.

## 2019-08-06 NOTE — H&P (Signed)
History and Physical    Derrek Puff UXL:[id number] DOB: 1965-08-23 DOA: 08/06/2019  PCP: Sofie Hartigan, MD  Patient coming from: Home  I have personally briefly reviewed patient's old medical records in [place]  Chief Complaint: Abdominal pain  HPI: Billy Hughes is a 54 y.o. male with medical history significant of hypertension, status post kidney transplant, chronic immunosuppression, history of DVT on Xarelto presents to the emergency department for evaluation of abdominal pain to half days duration.  Patient stated on Wednesday, 2 days prior to presentation he began to experience left upper quadrant and epigastric pain.  Pain gradually worsened and limited his p.o. intake.  He did endorse some nausea but denied over vomiting.  Patient denies any history of alcohol use.  No recent changes in medication.  No recent changes in diet.  No recent travel.  No other toxic habits  Presentation laboratory vesication revealed a minimal leukocytosis with elevated serum lipase.  Abdominal CAT scan was pursued.  Demonstrated mild acute pancreatitis of the pancreatic tail.  Hospitalist called for admission   ED Course: Initial vital signs reassuring.  Patient received several doses of IV narcotic and IV antiemetic with limited results.  Aggressively IV fluid resuscitated.  CAT scan demonstrating acute pancreatitis of the pancreatic tail without evidence of gallstones.  Hospitalist called for admission  Review of Systems: As per HPI otherwise 14 point review of systems negative.    Past Medical History:  Diagnosis Date  . Anemia   . Anxiety   . Cancer ([place])    skin  . Diabetes mellitus without complication ([place])   . Diverticulitis   . Gastric ulcer   . GERD (gastroesophageal reflux disease)   . Hypertension   . Renal disorder     Past Surgical History:  Procedure Laterality Date  . COLON RESECTION    . COLON SURGERY    . DIALYSIS FISTULA CREATION    . IVC  FILTER INSERTION Left 10/27/2017   Procedure: IVC FILTER INSERTION;  Surgeon: Algernon Huxley, MD;  Location: [place] [facility];  Service: Cardiovascular;  Laterality: Left;  . KIDNEY TRANSPLANT    . KNEE ARTHROPLASTY Left 11/12/2017   Procedure: COMPUTER ASSISTED TOTAL KNEE ARTHROPLASTY;  Surgeon: Dereck Leep, MD;  Location: [facility] OR[facility];  Service: Orthopedics;  Laterality: Left;  . NASAL RECONSTRUCTION       reports that he has been smoking cigarettes. He has been smoking about 1.00 pack per day. He has never used smokeless tobacco. He reports previous alcohol use. He reports that he does not use drugs.  No Known Allergies  Family History  Problem Relation Age of Onset  . Cancer Mother   . Cancer Father    No family history of pancreatitis  Prior to Admission medications   Medication Sig Start Date End Date Taking? Authorizing Provider  acetaminophen (TYLENOL) 500 MG tablet Take 1,000 mg by mouth every 8 (eight) hours as needed for pain. 12/14/17   [provider]  albuterol (PROVENTIL HFA;VENTOLIN HFA) 108 (90 Base) MCG/ACT inhaler Inhale 1-2 puffs into the lungs every 6 (six) hours as needed for wheezing or shortness of breath.    [provider]  amLODipine (NORVASC) 10 MG tablet Take 10 mg by mouth at bedtime.     [provider]  aspirin EC 81 MG tablet Take 81 mg by mouth at bedtime.    [provider]  atorvastatin (LIPITOR) 20 MG tablet Take 20 mg by mouth  at bedtime.    [provider]  Magnesium Oxide 250 MG TABS Take 500 mg by mouth daily.     [provider]  megestrol (MEGACE) 40 MG tablet Take 1 tablet (40 mg total) by mouth daily. 12/24/17   Nicholes Mango, MD  metoprolol succinate (TOPROL-XL) 25 MG 24 hr tablet Take 50 mg by mouth 2 (two) times daily.    [provider]  Multiple Vitamin (MULTIVITAMIN WITH MINERALS) TABS tablet Take 1 tablet by mouth daily. 12/07/17   Fritzi Mandes, MD  mycophenolate (MYFORTIC)  180 MG EC tablet Take 360 mg by mouth 2 (two) times daily.     [provider]  nortriptyline (PAMELOR) 25 MG capsule Take 25 mg by mouth at bedtime.    [provider]  oxyCODONE-acetaminophen (PERCOCET) 5-325 MG tablet Take 1 tablet by mouth every 4 (four) hours as needed. 06/08/19   Rudene Re, MD  pantoprazole (PROTONIX) 40 MG tablet Take 1 tablet (40 mg total) by mouth 2 (two) times daily. 09/30/17   Earleen Newport, MD  PARoxetine (PAXIL) 20 MG tablet Take 20 mg by mouth at bedtime.     [provider]  spironolactone (ALDACTONE) 25 MG tablet Take 25 mg by mouth daily.    [provider]  tacrolimus (PROGRAF) 1 MG capsule Take 1 mg by mouth 2 (two) times daily.     [provider]  traMADol (ULTRAM) 50 MG tablet Take 1-2 tablets (50-100 mg total) by mouth every 4 (four) hours as needed for moderate pain. 11/13/17   Watt Climes, PA  XARELTO 20 MG TABS tablet TAKE 1 TABLET BY MOUTH DAILY 04/01/18   Kris Hartmann, NP    Physical Exam: Vitals:   08/06/19 1713 08/06/19 1951 08/06/19 2058 08/06/19 2157  BP:  (!) 151/74 128/75 (!) 143/88  Pulse:  (!) 118 (!) 108 (!) 107  Resp:  22 20 20   Temp:      TempSrc:      SpO2:  99% 96% 94%  Weight: (!) 117.9 kg     Height: 6' (1.829 m)        Vitals:   08/06/19 1713 08/06/19 1951 08/06/19 2058 08/06/19 2157  BP:  (!) 151/74 128/75 (!) 143/88  Pulse:  (!) 118 (!) 108 (!) 107  Resp:  22 20 20   Temp:      TempSrc:      SpO2:  99% 96% 94%  Weight: (!) 117.9 kg     Height: 6' (1.829 m)     Constitutional: NAD, calm, mild distress due to pain Eyes: PERRL, lids and conjunctivae normal ENMT: Mucous membranes are moist. Posterior pharynx clear of any exudate or lesions.Normal dentition.  Neck: normal, supple, no masses, no thyromegaly Respiratory: clear to auscultation bilaterally, no wheezing, no crackles. Normal respiratory effort. No accessory muscle use.  Cardiovascular: Regular rate  and rhythm, no murmurs / rubs / gallops. No extremity edema. 2+ pedal pulses. No carotid bruits.  Abdomen: Obese, nondistended. tender to palpation left upper quadrant.  Normal bowel sounds Musculoskeletal: no clubbing / cyanosis. No joint deformity upper and lower extremities. Good ROM, no contractures. Normal muscle tone.  Skin: no rashes, lesions, ulcers. No induration Neurologic: CN 2-12 grossly intact. Sensation intact, DTR normal. Strength 5/5 in all 4.  Psychiatric: Normal judgment and insight. Alert and oriented x 3. Normal mood.    Labs on Admission: I have personally reviewed following labs and imaging studies  CBC: Recent Labs  Lab 08/06/19 1721  WBC 12.1*  HGB 16.9  HCT 49.7  MCV 86.1  PLT 034   Basic Metabolic Panel: Recent Labs  Lab 08/06/19 1721  NA 131*  K 4.3  CL 96*  CO2 26  GLUCOSE 114*  BUN 22*  CREATININE 1.71*  CALCIUM 9.8   GFR: Estimated Creatinine Clearance: 64.7 mL/min (A) (by C-G formula based on SCr of 1.71 mg/dL (H)). Liver Function Tests: Recent Labs  Lab 08/06/19 1721  AST 19  ALT 17  ALKPHOS 74  BILITOT 1.0  PROT 8.5*  ALBUMIN 4.2   Recent Labs  Lab 08/06/19 1721  LIPASE 186*   No results for input(s): AMMONIA in the last 168 hours. Coagulation Profile: No results for input(s): INR, PROTIME in the last 168 hours. Cardiac Enzymes: No results for input(s): CKTOTAL, CKMB, CKMBINDEX, TROPONINI in the last 168 hours. BNP (last 3 results) No results for input(s): PROBNP in the last 8760 hours. HbA1C: No results for input(s): HGBA1C in the last 72 hours. CBG: No results for input(s): GLUCAP in the last 168 hours. Lipid Profile: No results for input(s): CHOL, HDL, LDLCALC, TRIG, CHOLHDL, LDLDIRECT in the last 72 hours. Thyroid Function Tests: No results for input(s): TSH, T4TOTAL, FREET4, T3FREE, THYROIDAB in the last 72 hours. Anemia Panel: No results for input(s): VITAMINB12, FOLATE, FERRITIN, TIBC, IRON, RETICCTPCT in the  last 72 hours. Urine analysis:    Component Value Date/Time   COLORURINE YELLOW (A) 08/06/2019 1721   APPEARANCEUR CLEAR (A) 08/06/2019 1721   APPEARANCEUR Clear 04/11/2014 0022   LABSPEC 1.018 08/06/2019 1721   LABSPEC 1.014 04/11/2014 0022   PHURINE 5.0 08/06/2019 1721   GLUCOSEU NEGATIVE 08/06/2019 1721   GLUCOSEU Negative 04/11/2014 0022   HGBUR SMALL (A) 08/06/2019 1721   BILIRUBINUR NEGATIVE 08/06/2019 1721   BILIRUBINUR Negative 04/11/2014 0022   KETONESUR NEGATIVE 08/06/2019 1721   PROTEINUR 100 (A) 08/06/2019 1721   NITRITE NEGATIVE 08/06/2019 1721   LEUKOCYTESUR NEGATIVE 08/06/2019 1721   LEUKOCYTESUR Negative 04/11/2014 0022    Radiological Exams on Admission: CT ABDOMEN PELVIS WO CONTRAST  Result Date: 08/06/2019 CLINICAL DATA:  Pancreatitis, epigastric abdominal pain. EXAM: CT ABDOMEN AND PELVIS WITHOUT CONTRAST TECHNIQUE: Multidetector CT imaging of the abdomen and pelvis was performed following the standard protocol without IV contrast. COMPARISON:  08/07/2018 FINDINGS: Lower chest: Mild bibasilar atelectasis. Visualized heart and pericardium are unremarkable. Hepatobiliary: Liver unremarkable. Gallbladder unremarkable. No intra or or extrahepatic biliary ductal dilation. Pancreas: There is mild peripancreatic inflammatory stranding involving the tail the pancreas with edema tracking into the anterior pararenal space. The pancreatic duct is not dilated. No parenchymal calcifications are seen. There are no peripancreatic fluid collections. Parenchymal enhancement cannot be assessed on this noncontrast examination. Spleen: Unremarkable. Adrenals/Urinary Tract: Adrenal glands are unremarkable. The native kidneys are markedly atrophic in keeping with chronic renal failure. Transplant kidney seen within the right iliac fossa. No hydronephrosis, intrarenal ureteral calcifications, or significant perinephric stranding involving the transplant kidney. Bladder unremarkable.  Stomach/Bowel: Stomach unremarkable. Surgical changes of sigmoid colectomy are identified. The residual large and small bowel are unremarkable. Appendix normal. No free intraperitoneal gas or fluid. Vascular/Lymphatic: No pathologic adenopathy within the abdomen and pelvis. 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 Moderate aortoiliac atherosclerotic calcification noted. Infrarenal inferior vena cava filter is unchanged in position. Reproductive: Unremarkable Other: Small fat containing indirect right inguinal hernia is present. Small broad-based fat containing umbilical hernia is present. Rectum unremarkable Musculoskeletal: No acute bone abnormality.  IMPRESSION: Mild, uncomplicated pancreatitis involving the tail of the pancreas. Note that the parenchymal viability is not well assessed on this noncontrast examination. 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 Aortic Atherosclerosis (ICD10-I70.0). Electronically Signed   By: Fidela Salisbury MD   On: 08/06/2019 21:22    EKG: Independently reviewed.  Sinus tachycardia  Assessment/Plan Active Problems:   Acute pancreatitis  Acute pancreatitis Unknown trigger Patient denies alcohol use CT scan negative for gallstones No recent change in medications Mild pancreatitis at pancreatic tail noted on CT with mild elevation in serum lipase Plan: Admit inpatient, MedSurg Bowel rest, clear liquid diet Aggressive IV fluid resuscitation Narcotic regimen for pain control Trend serum lipase  Acute kidney injury on chronic kidney disease Status post kidney transplant on chronic immunosuppression Patient states kidney transplant approximately 9 years ago Sees transplant nephrologist at [facility] Is compliant with immunosuppressive medication Myfortic and tacrolimus Kidney function slightly worsened than baseline, baseline approximately 1.4 Plan: Continue tacrolimus 1 mg twice daily Continue Myfortic 360 mg twice  daily Check daily tacrolimus levels Check daily BMP Consider nephrology input if kidney function worsens  Hypertension Continue metoprolol Continue amlodipine Home spironolactone on hold  History DVT Continue Xarelto  Hyperlipidemia Hold statin  GERD Protonix   DVT prophylaxis: Xarelto  code Status: Full Family Communication: None at bedside Disposition Plan: Anticipate return to previous home environment  consults called: None Admission status: Inpatient, MedSurg   Sidney Ace MD [facility]  If 7PM-7AM, please contact night-coverage   08/06/2019, 10:44 PM

## 2019-08-06 NOTE — ED Provider Notes (Signed)
[facility] Emergency Department Provider Note  ____________________________________________  Time seen: Approximately 11:36 PM  I have reviewed the triage vital signs and the nursing notes.   HISTORY  Chief Complaint Abdominal Pain    HPI Billy Hughes is a 54 y.o. male with a history of diabetes, GERD, hypertension, renal transplant  who comes the ED complaining of epigastric and left upper quadrant abdominal pain, started 2 days ago, constant, nonradiating, worse with eating, no alleviating factors, severe, sharp.  Never had pain like this before.  Denies any history of pancreatitis or biliary disease.  Notes that over the past week he has been having pain with eating so he has had poor intake for several days.  Denies fever trauma new medicines prednisone or steroids or recent illness.  Takes a statin for hyperlipidemia.     Past Medical History:  Diagnosis Date  . Anemia   . Anxiety   . Cancer ([place])    skin  . Diabetes mellitus without complication ([place])   . Diverticulitis   . Gastric ulcer   . GERD (gastroesophageal reflux disease)   . Hypertension   . Renal disorder      Patient Active Problem List   Diagnosis Date Noted  . Acute pancreatitis 08/06/2019  . Acute kidney injury superimposed on chronic kidney disease ([place]) 08/06/2019  . Acute renal failure (ARF) ([place]) 12/22/2017  . Renal injury 12/04/2017  . S/P total knee arthroplasty 11/12/2017  . Primary osteoarthritis of left knee 05/30/2017  . Hypertension 02/28/2017  . Renal disorder 02/28/2017  . DVT (deep venous thrombosis) ([place]) 02/28/2017  . GERD without esophagitis 02/10/2017  . Dyslipidemia 02/10/2017  . Recurrent major depressive disorder, in partial remission ([place]) 02/10/2017  . Post concussion syndrome 03/05/2016  . Kidney transplant status 11/05/2011     Past Surgical History:  Procedure Laterality Date  . COLON RESECTION    . COLON SURGERY    . DIALYSIS  FISTULA CREATION    . IVC FILTER INSERTION Left 10/27/2017   Procedure: IVC FILTER INSERTION;  Surgeon: Algernon Huxley, MD;  Location: [facility];  Service: Cardiovascular;  Laterality: Left;  . KIDNEY TRANSPLANT    . KNEE ARTHROPLASTY Left 11/12/2017   Procedure: COMPUTER ASSISTED TOTAL KNEE ARTHROPLASTY;  Surgeon: Dereck Leep, MD;  Location: [facility];  Service: Orthopedics;  Laterality: Left;  . NASAL RECONSTRUCTION       Prior to Admission medications   Medication Sig Start Date End Date Taking? Authorizing Provider  acetaminophen (TYLENOL) 500 MG tablet Take 1,000 mg by mouth every 8 (eight) hours as needed for pain. 12/14/17   [provider]  albuterol (PROVENTIL HFA;VENTOLIN HFA) 108 (90 Base) MCG/ACT inhaler Inhale 1-2 puffs into the lungs every 6 (six) hours as needed for wheezing or shortness of breath.    [provider]  amLODipine (NORVASC) 10 MG tablet Take 10 mg by mouth at bedtime.     [provider]  aspirin EC 81 MG tablet Take 81 mg by mouth at bedtime.    [provider]  atorvastatin (LIPITOR) 20 MG tablet Take 20 mg by mouth at bedtime.    [provider]  Magnesium Oxide 250 MG TABS Take 500 mg by mouth daily.     [provider]  megestrol (MEGACE) 40 MG tablet Take 1 tablet (40 mg total) by mouth daily. 12/24/17   Nicholes Mango, MD  metoprolol succinate (TOPROL-XL) 25 MG 24 hr tablet Take 50  mg by mouth 2 (two) times daily.    [provider]  Multiple Vitamin (MULTIVITAMIN WITH MINERALS) TABS tablet Take 1 tablet by mouth daily. 12/07/17   Fritzi Mandes, MD  mycophenolate (MYFORTIC) 180 MG EC tablet Take 360 mg by mouth 2 (two) times daily.     [provider]  nortriptyline (PAMELOR) 25 MG capsule Take 25 mg by mouth at bedtime.    [provider]  oxyCODONE-acetaminophen (PERCOCET) 5-325 MG tablet Take 1 tablet by mouth every 4 (four) hours as needed. 06/08/19   Rudene Re, MD  pantoprazole (PROTONIX) 40 MG tablet Take 1 tablet (40 mg total) by mouth 2 (two) times daily. 09/30/17   Earleen Newport, MD  PARoxetine (PAXIL) 20 MG tablet Take 20 mg by mouth at bedtime.     [provider]  spironolactone (ALDACTONE) 25 MG tablet Take 25 mg by mouth daily.    [provider]  tacrolimus (PROGRAF) 1 MG capsule Take 1 mg by mouth 2 (two) times daily.     [provider]  traMADol (ULTRAM) 50 MG tablet Take 1-2 tablets (50-100 mg total) by mouth every 4 (four) hours as needed for moderate pain. 11/13/17   Watt Climes, PA  XARELTO 20 MG TABS tablet TAKE 1 TABLET BY MOUTH DAILY 04/01/18   Kris Hartmann, NP     Allergies Patient has no known allergies.   Family History  Problem Relation Age of Onset  . Cancer Mother   . Cancer Father     Social History Social History   Tobacco Use  . Smoking status: Current Every Day Smoker    Packs/day: 1.00    Types: Cigarettes  . Smokeless tobacco: Never Used  Vaping Use  . Vaping Use: Never used  Substance Use Topics  . Alcohol use: Not Currently    Comment: social  . Drug use: No    Review of Systems  Constitutional:   No fever or chills.  ENT:   No sore throat. No rhinorrhea. Cardiovascular:   No chest pain or syncope. Respiratory:   No dyspnea or cough. Gastrointestinal: Positive as above for upper abdominal pain without vomiting and diarrhea.  Musculoskeletal:   Negative for focal pain or swelling All other systems reviewed and are negative except as documented above in ROS and HPI.  ____________________________________________   PHYSICAL EXAM:  VITAL SIGNS: ED Triage Vitals  Enc Vitals Group     BP 08/06/19 1712 (!) 172/94     Pulse Rate 08/06/19 1712 (!) 108     Resp 08/06/19 1712 20     Temp 08/06/19 1712 98.4 F (36.9 C)     Temp Source 08/06/19 1712 Oral     SpO2 08/06/19 1712 96 %     Weight 08/06/19 1713 (!) 260 lb (117.9 kg)     Height 08/06/19  1713 6' (1.829 m)     Head Circumference --      Peak Flow --      Pain Score 08/06/19 1712 8     Pain Loc --      Pain Edu? --      Excl. in [place]? --     Vital signs reviewed, nursing assessments reviewed.   Constitutional:   Alert and oriented. Non-toxic appearance. Eyes:   Conjunctivae are normal. EOMI. PERRL. ENT      Head:   Normocephalic and atraumatic.      Nose:   Wearing a mask.  Mouth/Throat:   Wearing a mask.      Neck:   No meningismus. Full ROM. Hematological/Lymphatic/Immunilogical:   No cervical lymphadenopathy. Cardiovascular:   RRR. Symmetric bilateral radial and DP pulses.  No murmurs. Cap refill less than 2 seconds. Respiratory:   Normal respiratory effort without tachypnea/retractions. Breath sounds are clear and equal bilaterally. No wheezes/rales/rhonchi. Gastrointestinal:   Soft with pronounced epigastric and left upper quadrant tenderness.  Non distended. There is no CVA tenderness.  No rebound, rigidity, or guarding.  Musculoskeletal:   Normal range of motion in all extremities. No joint effusions.  No lower extremity tenderness.  No edema. Neurologic:   Normal speech and language.  Motor grossly intact. No acute focal neurologic deficits are appreciated.  Skin:    Skin is warm, dry and intact. No rash noted.  No petechiae, purpura, or bullae.  ____________________________________________    LABS (pertinent positives/negatives) (all labs ordered are listed, but only abnormal results are displayed) Labs Reviewed  LIPASE, BLOOD - Abnormal; Notable for the following components:      Result Value   Lipase 186 (*)    All other components within normal limits  COMPREHENSIVE METABOLIC PANEL - Abnormal; Notable for the following components:   Sodium 131 (*)    Chloride 96 (*)    Glucose, Bld 114 (*)    BUN 22 (*)    Creatinine, Ser 1.71 (*)    Total Protein 8.5 (*)    GFR calc non Af Amer 44 (*)    GFR calc Af Amer 51 (*)    All other components  within normal limits  CBC - Abnormal; Notable for the following components:   WBC 12.1 (*)    All other components within normal limits  URINALYSIS, COMPLETE (UACMP) WITH MICROSCOPIC - Abnormal; Notable for the following components:   Color, Urine YELLOW (*)    APPearance CLEAR (*)    Hgb urine dipstick SMALL (*)    Protein, ur 100 (*)    All other components within normal limits  HIV ANTIBODY (ROUTINE TESTING W REFLEX)  COMPREHENSIVE METABOLIC PANEL  CBC  PROTIME-INR  TACROLIMUS LEVEL  LIPID PANEL  TROPONIN I (HIGH SENSITIVITY)  TROPONIN I (HIGH SENSITIVITY)   ____________________________________________   EKG  Interpreted by me Sinus tachycardia rate 110, left axis, normal intervals.  Poor R wave progression.  Normal ST segments and T waves.  ____________________________________________    RADIOLOGY  CT ABDOMEN PELVIS WO CONTRAST  Result Date: 08/06/2019 CLINICAL DATA:  Pancreatitis, epigastric abdominal pain. EXAM: CT ABDOMEN AND PELVIS WITHOUT CONTRAST TECHNIQUE: Multidetector CT imaging of the abdomen and pelvis was performed following the standard protocol without IV contrast. COMPARISON:  08/07/2018 FINDINGS: Lower chest: Mild bibasilar atelectasis. Visualized heart and pericardium are unremarkable. Hepatobiliary: Liver unremarkable. Gallbladder unremarkable. No intra or or extrahepatic biliary ductal dilation. Pancreas: There is mild peripancreatic inflammatory stranding involving the tail the pancreas with edema tracking into the anterior pararenal space. The pancreatic duct is not dilated. No parenchymal calcifications are seen. There are no peripancreatic fluid collections. Parenchymal enhancement cannot be assessed on this noncontrast examination. Spleen: Unremarkable. Adrenals/Urinary Tract: Adrenal glands are unremarkable. The native kidneys are markedly atrophic in keeping with chronic renal failure. Transplant kidney seen within the right iliac fossa. No  hydronephrosis, intrarenal ureteral calcifications, or significant perinephric stranding involving the transplant kidney. Bladder unremarkable. Stomach/Bowel: Stomach unremarkable. Surgical changes of sigmoid colectomy are identified. The residual large and small bowel are unremarkable. Appendix normal. No free intraperitoneal gas or  fluid. Vascular/Lymphatic: No pathologic adenopathy within the abdomen and pelvis. 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 Moderate aortoiliac atherosclerotic calcification noted. Infrarenal inferior vena cava filter is unchanged in position. Reproductive: Unremarkable Other: Small fat containing indirect right inguinal hernia is present. Small broad-based fat containing umbilical hernia is present. Rectum unremarkable Musculoskeletal: No acute bone abnormality. IMPRESSION: Mild, uncomplicated pancreatitis involving the tail of the pancreas. Note that the parenchymal viability is not well assessed on this noncontrast examination. 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 Aortic Atherosclerosis (ICD10-I70.0). Electronically Signed   By: Fidela Salisbury MD   On: 08/06/2019 21:22    ____________________________________________   PROCEDURES Procedures  ____________________________________________  DIFFERENTIAL DIAGNOSIS   Pancreatitis, choledocholithiasis, diverticulitis, gastritis  CLINICAL IMPRESSION / ASSESSMENT AND PLAN / ED COURSE  Medications ordered in the ED: Medications  aspirin EC tablet 81 mg (has no administration in time range)  megestrol (MEGACE) tablet 40 mg (has no administration in time range)  amLODipine (NORVASC) tablet 10 mg (has no administration in time range)  metoprolol succinate (TOPROL-XL) 24 hr tablet 50 mg (has no administration in time range)  nortriptyline (PAMELOR) capsule 25 mg (has no administration in time range)  PARoxetine (PAXIL) tablet 20 mg (has no administration in time range)   pantoprazole (PROTONIX) EC tablet 40 mg (has no administration in time range)  mycophenolate (MYFORTIC) EC tablet 360 mg (has no administration in time range)  tacrolimus (PROGRAF) capsule 1 mg (has no administration in time range)  rivaroxaban (XARELTO) tablet 20 mg (has no administration in time range)  0.9 %  sodium chloride infusion (has no administration in time range)  acetaminophen (TYLENOL) tablet 650 mg (has no administration in time range)    Or  acetaminophen (TYLENOL) suppository 650 mg (has no administration in time range)  oxyCODONE (Oxy IR/ROXICODONE) immediate release tablet 5 mg (has no administration in time range)  traZODone (DESYREL) tablet 25 mg (has no administration in time range)  ondansetron (ZOFRAN) tablet 4 mg (has no administration in time range)    Or  ondansetron (ZOFRAN) injection 4 mg (has no administration in time range)  albuterol (PROVENTIL) (2.5 MG/3ML) 0.083% nebulizer solution 2.5 mg (has no administration in time range)  hydrALAZINE (APRESOLINE) injection 10 mg (has no administration in time range)  HYDROmorphone (DILAUDID) injection 1 mg (has no administration in time range)  fentaNYL (SUBLIMAZE) injection 50 mcg (50 mcg Intravenous Given 08/06/19 1954)  ondansetron (ZOFRAN) injection 4 mg (4 mg Intravenous Given 08/06/19 1725)  HYDROmorphone (DILAUDID) injection 1 mg (1 mg Intravenous Given 08/06/19 2129)  sodium chloride 0.9 % bolus 1,000 mL (1,000 mLs Intravenous New Bag/Given 08/06/19 2127)  ondansetron (ZOFRAN) injection 4 mg (4 mg Intravenous Given 08/06/19 2127)    Pertinent labs & imaging results that were available during my care of the patient were reviewed by me and considered in my medical decision making (see chart for details).  Abdulaziz Toman was evaluated in Emergency Department on 08/06/2019 for the symptoms described in the history of present illness. He was evaluated in the context of the global COVID-19 pandemic, which  necessitated consideration that the patient might be at risk for infection with the SARS-CoV-2 virus that causes COVID-19. Institutional protocols and algorithms that pertain to the evaluation of patients at risk for COVID-19 are in a state of rapid change based on information released by regulatory bodies including the CDC and federal and state organizations. These policies and algorithms were followed during  the patient's care in the ED.   Patient presents with upper abdominal pain and tenderness, decreased oral intake.  Has tachycardia on arrival which I think is due to dehydration.  Labs show elevated lipase, with unremarkable LFTs.  Creatinine shows stable baseline CKD.  Troponin negative.    CT a/p shows inflammatory changes at tail of pancreas, confirming pancreatitis.  Pt given dilaudid 1mg  IV with moderate pain control, IVF for hydration.       ____________________________________________   FINAL CLINICAL IMPRESSION(S) / ED DIAGNOSES    Final diagnoses:  Idiopathic acute pancreatitis, unspecified complication status     ED Discharge Orders    None      Portions of this note were generated with dragon dictation software. Dictation errors may occur despite best attempts at proofreading.   Carrie Mew, MD 08/06/19 [phone]

## 2019-08-07 LAB — PROTIME-INR
INR: 1 (ref 0.8–1.2)
Prothrombin Time: 13 seconds (ref 11.4–15.2)

## 2019-08-07 LAB — COMPREHENSIVE METABOLIC PANEL
ALT: 15 U/L (ref 0–44)
AST: 16 U/L (ref 15–41)
Albumin: 3.9 g/dL (ref 3.5–5.0)
Alkaline Phosphatase: 67 U/L (ref 38–126)
Anion gap: 9 (ref 5–15)
BUN: 23 mg/dL — ABNORMAL HIGH (ref 6–20)
CO2: 25 mmol/L (ref 22–32)
Calcium: 9.2 mg/dL (ref 8.9–10.3)
Chloride: 100 mmol/L (ref 98–111)
Creatinine, Ser: 1.68 mg/dL — ABNORMAL HIGH (ref 0.61–1.24)
GFR calc Af Amer: 52 mL/min — ABNORMAL LOW (ref >60)
GFR calc non Af Amer: 45 mL/min — ABNORMAL LOW (ref >60)
Glucose, Bld: 140 mg/dL — ABNORMAL HIGH (ref 70–99)
Potassium: 4.2 mmol/L (ref 3.5–5.1)
Sodium: 134 mmol/L — ABNORMAL LOW (ref 135–145)
Total Bilirubin: 1.2 mg/dL (ref 0.3–1.2)
Total Protein: 7.9 g/dL (ref 6.5–8.1)

## 2019-08-07 LAB — CBC
HCT: 45.5 % (ref 39.0–52.0)
Hemoglobin: 15.7 g/dL (ref 13.0–17.0)
MCH: 29.9 pg (ref 26.0–34.0)
MCHC: 34.5 g/dL (ref 30.0–36.0)
MCV: 86.7 fL (ref 80.0–100.0)
Platelets: 156 10*3/uL (ref 150–400)
RBC: 5.25 MIL/uL (ref 4.22–5.81)
RDW: 14.8 % (ref 11.5–15.5)
WBC: 9 10*3/uL (ref 4.0–10.5)
nRBC: 0 % (ref 0.0–0.2)

## 2019-08-07 LAB — LIPID PANEL
Cholesterol: 106 mg/dL (ref 0–200)
HDL: 23 mg/dL — ABNORMAL LOW (ref >40)
LDL Cholesterol: 59 mg/dL (ref 0–99)
Total CHOL/HDL Ratio: 4.6 RATIO
Triglycerides: 118 mg/dL (ref <150)
VLDL: 24 mg/dL (ref 0–40)

## 2019-08-07 LAB — HIV ANTIBODY (ROUTINE TESTING W REFLEX): HIV Screen 4th Generation wRfx: NONREACTIVE

## 2019-08-07 NOTE — Discharge Summary (Signed)
Brief discharge summary  Patient left against medical advice.  Active Problems:   Hypertension   DVT (deep venous thrombosis) ([facility])   GERD without esophagitis   Dyslipidemia   Kidney transplant status   Acute pancreatitis   Acute kidney injury superimposed on chronic kidney disease ([place])  Patient presented secondary to abdominal pain and was found to have acute pancreatitis. He was started on IV fluids and IV analgesics. On hospital day 2, patient decided to leave AMA as he felt that he could manage at home and wanted to smoke a cigarette.   Admitting physician HPI:  HPI: Billy Hughes is a 54 y.o. male with medical history significant of hypertension, status post kidney transplant, chronic immunosuppression, history of DVT on Xarelto presents to the emergency department for evaluation of abdominal pain to half days duration.  Patient stated on Wednesday, 2 days prior to presentation he began to experience left upper quadrant and epigastric pain.  Pain gradually worsened and limited his p.o. intake.  He did endorse some nausea but denied over vomiting.  Patient denies any history of alcohol use.  No recent changes in medication.  No recent changes in diet.  No recent travel.  No other toxic habits  Presentation laboratory vesication revealed a minimal leukocytosis with elevated serum lipase.  Abdominal CAT scan was pursued.  Demonstrated mild acute pancreatitis of the pancreatic tail.  Hospitalist called for admission   ED Course: Initial vital signs reassuring.  Patient received several doses of IV narcotic and IV antiemetic with limited results.  Aggressively IV fluid resuscitated.  CAT scan demonstrating acute pancreatitis of the pancreatic tail without evidence of gallstones.  Hospitalist called for admission  Admitting physician Assessment/Plan:  Acute pancreatitis Unknown trigger Patient denies alcohol use CT scan negative for gallstones No recent change in  medications Mild pancreatitis at pancreatic tail noted on CT with mild elevation in serum lipase Plan: Admit inpatient, MedSurg Bowel rest, clear liquid diet Aggressive IV fluid resuscitation Narcotic regimen for pain control Trend serum lipase  Acute kidney injury on chronic kidney disease Status post kidney transplant on chronic immunosuppression Patient states kidney transplant approximately 9 years ago Sees transplant nephrologist at [facility] Is compliant with immunosuppressive medication Myfortic and tacrolimus Kidney function slightly worsened than baseline, baseline approximately 1.4 Plan: Continue tacrolimus 1 mg twice daily Continue Myfortic 360 mg twice daily Check daily tacrolimus levels Check daily BMP Consider nephrology input if kidney function worsens  Hypertension Continue metoprolol Continue amlodipine Home spironolactone on hold  History DVT Continue Xarelto  Hyperlipidemia Hold statin  GERD Protonix  Patient left without being seen by me.   Cordelia Poche, MD [facility] 08/08/2019, 4:22 PM

## 2019-08-07 NOTE — ED Notes (Addendum)
Pt offered liquids  As per diet order  Nicki Reaper, pharmacy call for immunosuppressant meds

## 2019-08-07 NOTE — Progress Notes (Signed)
This morning at approximately 0930, patient requested to go outside for a cigarette. RN informed patient we do not allow patients to come and go from the building while they are admitted. Patient requested to see MD; Dr. Lonny Prude paged who stated he would see the patient within approximately an hour. Patient asked several times to have IV unhooked, then later asked for IV to be completely removed. Patient stated he wanted to leave. RN paged Dr. Lonny Prude again, who spoke with patient over the phone. Patient handed the phone to this RN and stated "I'm done talking to him, get this (indicated IV) out of my arm, I'm leaving." RN informed Dr. Lonny Prude via phone of patient's decision to sign out AMA. Patient stated "I can't believe you're going to let me leave here hurting, in pain, over a five minute cigarette." RN offered to request order for nicotine patch for patient. Patient states "those don't work for me, I'll just go." IV removed and patient signed AMA form and ambulated out of the building at approximately 1025.

## 2019-08-10 LAB — TACROLIMUS LEVEL: Tacrolimus (FK506) - LabCorp: 4.6 ng/mL (ref 2.0–20.0)

## 2020-02-02 ENCOUNTER — Other Ambulatory Visit: Payer: Self-pay | Admitting: Neurology

## 2020-02-02 DIAGNOSIS — H93A3 Pulsatile tinnitus, bilateral: Secondary | ICD-10-CM

## 2020-02-02 DIAGNOSIS — G932 Benign intracranial hypertension: Secondary | ICD-10-CM

## 2020-02-04 ENCOUNTER — Other Ambulatory Visit: Payer: Self-pay | Admitting: Neurology

## 2020-02-04 DIAGNOSIS — G932 Benign intracranial hypertension: Secondary | ICD-10-CM

## 2020-02-11 ENCOUNTER — Ambulatory Visit: Payer: BC Managed Care – PPO

## 2020-03-16 ENCOUNTER — Other Ambulatory Visit: Payer: Self-pay

## 2020-03-16 ENCOUNTER — Ambulatory Visit
Admission: RE | Admit: 2020-03-16 | Discharge: 2020-03-16 | Disposition: A | Discharge status: Home / Self Care | Payer: BC Managed Care – PPO | Source: Ambulatory Visit | Attending: Neurology | Admitting: Neurology

## 2020-03-16 DIAGNOSIS — G932 Benign intracranial hypertension: Secondary | ICD-10-CM | POA: Diagnosis not present

## 2020-03-16 DIAGNOSIS — H93A3 Pulsatile tinnitus, bilateral: Secondary | ICD-10-CM

## 2020-06-19 ENCOUNTER — Ambulatory Visit: Payer: BC Managed Care – PPO | Attending: Family Medicine

## 2020-06-19 ENCOUNTER — Other Ambulatory Visit (HOSPITAL_COMMUNITY): Payer: Self-pay | Admitting: Family Medicine

## 2020-06-19 ENCOUNTER — Other Ambulatory Visit: Payer: Self-pay | Admitting: Family Medicine

## 2020-06-19 DIAGNOSIS — K5792 Diverticulitis of intestine, part unspecified, without perforation or abscess without bleeding: Secondary | ICD-10-CM

## 2020-06-19 DIAGNOSIS — R1032 Left lower quadrant pain: Secondary | ICD-10-CM

## 2021-10-23 ENCOUNTER — Other Ambulatory Visit (HOSPITAL_COMMUNITY): Payer: Self-pay | Admitting: Orthopedic Surgery

## 2021-10-23 ENCOUNTER — Other Ambulatory Visit: Payer: Self-pay | Admitting: Orthopedic Surgery

## 2021-10-23 DIAGNOSIS — Z96652 Presence of left artificial knee joint: Secondary | ICD-10-CM

## 2021-10-29 ENCOUNTER — Encounter
Admission: RE | Admit: 2021-10-29 | Discharge: 2021-10-29 | Disposition: A | Discharge status: Home / Self Care | Payer: BC Managed Care – PPO | Source: Ambulatory Visit | Attending: Orthopedic Surgery | Admitting: Orthopedic Surgery

## 2021-10-29 DIAGNOSIS — Z96652 Presence of left artificial knee joint: Secondary | ICD-10-CM | POA: Insufficient documentation

## 2021-10-29 MED ORDER — TECHNETIUM TC 99M MEDRONATE IV KIT
20.0000 | PACK | Freq: Once | INTRAVENOUS | Status: AC | PRN
  Administered 2021-10-29: 22.97 via INTRAVENOUS

## 2021-12-27 ENCOUNTER — Inpatient Hospital Stay: Admission: RE | Admit: 2021-12-27 | Payer: BC Managed Care – PPO | Source: Ambulatory Visit

## 2022-01-04 ENCOUNTER — Ambulatory Visit: Admit: 2022-01-04 | Payer: BC Managed Care – PPO | Admitting: Podiatry

## 2022-01-04 SURGERY — EXCISION MASS
Anesthesia: Choice | Laterality: Left

## 2022-02-21 ENCOUNTER — Ambulatory Visit
Admission: RE | Admit: 2022-02-21 | Discharge: 2022-02-21 | Disposition: A | Discharge status: Home / Self Care | Payer: BC Managed Care – PPO | Source: Ambulatory Visit | Attending: Family Medicine | Admitting: Family Medicine

## 2022-02-21 ENCOUNTER — Other Ambulatory Visit: Payer: Self-pay | Admitting: Family Medicine

## 2022-02-21 DIAGNOSIS — M7989 Other specified soft tissue disorders: Secondary | ICD-10-CM | POA: Diagnosis present
